# Patient Record
Sex: Male | Born: 1940
Health system: Southern US, Community
[De-identification: ages and names within clinical notes are randomized; demographics above are authoritative.]

## PROBLEM LIST (undated history)

## (undated) DIAGNOSIS — I491 Atrial premature depolarization: Secondary | ICD-10-CM

## (undated) DIAGNOSIS — E785 Hyperlipidemia, unspecified: Secondary | ICD-10-CM

## (undated) DIAGNOSIS — N400 Enlarged prostate without lower urinary tract symptoms: Secondary | ICD-10-CM

## (undated) DIAGNOSIS — L80 Vitiligo: Secondary | ICD-10-CM

## (undated) DIAGNOSIS — M545 Low back pain, unspecified: Secondary | ICD-10-CM

## (undated) DIAGNOSIS — I10 Essential (primary) hypertension: Secondary | ICD-10-CM

## (undated) DIAGNOSIS — Z789 Other specified health status: Secondary | ICD-10-CM

## (undated) DIAGNOSIS — E119 Type 2 diabetes mellitus without complications: Secondary | ICD-10-CM

## (undated) DIAGNOSIS — E538 Deficiency of other specified B group vitamins: Secondary | ICD-10-CM

## (undated) DIAGNOSIS — I341 Nonrheumatic mitral (valve) prolapse: Secondary | ICD-10-CM

## (undated) DIAGNOSIS — M1990 Unspecified osteoarthritis, unspecified site: Secondary | ICD-10-CM

## (undated) HISTORY — DX: Benign prostatic hyperplasia without lower urinary tract symptoms: N40.0

## (undated) HISTORY — DX: Vitiligo: L80

## (undated) HISTORY — DX: Nonrheumatic mitral (valve) prolapse: I34.1

## (undated) HISTORY — DX: Unspecified osteoarthritis, unspecified site: M19.90

## (undated) HISTORY — DX: Deficiency of other specified B group vitamins: E53.8

## (undated) HISTORY — DX: Type 2 diabetes mellitus without complications: E11.9

## (undated) HISTORY — DX: Hyperlipidemia, unspecified: E78.5

## (undated) HISTORY — DX: Low back pain, unspecified: M54.50

## (undated) HISTORY — PX: OTHER SURGICAL HISTORY: SHX169

## (undated) HISTORY — PX: EYE SURGERY: SHX253

## (undated) HISTORY — DX: Low back pain: M54.5

## (undated) HISTORY — DX: Other specified health status: Z78.9

---

## 1969-12-25 HISTORY — PX: HERNIA REPAIR: SHX51

## 1998-08-12 ENCOUNTER — Ambulatory Visit (HOSPITAL_COMMUNITY): Admission: RE | Admit: 1998-08-12 | Discharge: 1998-08-12 | Payer: Self-pay | Admitting: Adolescent Medicine

## 2000-08-06 ENCOUNTER — Ambulatory Visit (HOSPITAL_COMMUNITY): Admission: RE | Admit: 2000-08-06 | Discharge: 2000-08-06 | Payer: Self-pay | Admitting: Emergency Medicine

## 2000-08-06 ENCOUNTER — Encounter: Payer: Self-pay | Admitting: Emergency Medicine

## 2003-09-08 ENCOUNTER — Ambulatory Visit (HOSPITAL_COMMUNITY): Admission: RE | Admit: 2003-09-08 | Discharge: 2003-09-08 | Payer: Self-pay | Admitting: Internal Medicine

## 2003-09-08 ENCOUNTER — Encounter: Payer: Self-pay | Admitting: Internal Medicine

## 2009-12-25 DIAGNOSIS — E538 Deficiency of other specified B group vitamins: Secondary | ICD-10-CM

## 2009-12-25 DIAGNOSIS — E119 Type 2 diabetes mellitus without complications: Secondary | ICD-10-CM

## 2009-12-25 HISTORY — DX: Type 2 diabetes mellitus without complications: E11.9

## 2009-12-25 HISTORY — DX: Deficiency of other specified B group vitamins: E53.8

## 2010-07-11 ENCOUNTER — Ambulatory Visit: Payer: Self-pay | Admitting: Internal Medicine

## 2010-07-11 LAB — CONVERTED CEMR LAB
ALT: 25 units/L (ref 0–53)
AST: 24 units/L (ref 0–37)
Albumin: 4 g/dL (ref 3.5–5.2)
Alkaline Phosphatase: 63 units/L (ref 39–117)
BUN: 19 mg/dL (ref 6–23)
Basophils Absolute: 0 10*3/uL (ref 0.0–0.1)
Basophils Relative: 0.7 % (ref 0.0–3.0)
Bilirubin Urine: NEGATIVE
Bilirubin, Direct: 0.1 mg/dL (ref 0.0–0.3)
CO2: 26 meq/L (ref 19–32)
Calcium: 9.1 mg/dL (ref 8.4–10.5)
Chloride: 109 meq/L (ref 96–112)
Cholesterol: 193 mg/dL (ref 0–200)
Creatinine, Ser: 1.1 mg/dL (ref 0.4–1.5)
Eosinophils Absolute: 0.2 10*3/uL (ref 0.0–0.7)
Eosinophils Relative: 3.2 % (ref 0.0–5.0)
GFR calc non Af Amer: 72.21 mL/min (ref >60)
Glucose, Bld: 135 mg/dL — ABNORMAL HIGH (ref 70–99)
HCT: 44.7 % (ref 39.0–52.0)
HDL: 41.5 mg/dL (ref >39.00)
Hemoglobin, Urine: NEGATIVE
Hemoglobin: 15.4 g/dL (ref 13.0–17.0)
Ketones, ur: NEGATIVE mg/dL
LDL Cholesterol: 135 mg/dL — ABNORMAL HIGH (ref 0–99)
Leukocytes, UA: NEGATIVE
Lymphocytes Relative: 49.9 % — ABNORMAL HIGH (ref 12.0–46.0)
Lymphs Abs: 3.2 10*3/uL (ref 0.7–4.0)
MCHC: 34.3 g/dL (ref 30.0–36.0)
MCV: 92 fL (ref 78.0–100.0)
Monocytes Absolute: 0.6 10*3/uL (ref 0.1–1.0)
Monocytes Relative: 9.1 % (ref 3.0–12.0)
Neutro Abs: 2.4 10*3/uL (ref 1.4–7.7)
Neutrophils Relative %: 37.1 % — ABNORMAL LOW (ref 43.0–77.0)
Nitrite: NEGATIVE
PSA: 0.46 ng/mL (ref 0.10–4.00)
Platelets: 283 10*3/uL (ref 150.0–400.0)
Potassium: 4.4 meq/L (ref 3.5–5.1)
RBC: 4.86 M/uL (ref 4.22–5.81)
RDW: 13.3 % (ref 11.5–14.6)
Sodium: 138 meq/L (ref 135–145)
Specific Gravity, Urine: >=1.03 (ref 1.000–1.030)
Total Bilirubin: 0.6 mg/dL (ref 0.3–1.2)
Total CHOL/HDL Ratio: 5
Total Protein, Urine: NEGATIVE mg/dL
Total Protein: 6.8 g/dL (ref 6.0–8.3)
Triglycerides: 83 mg/dL (ref 0.0–149.0)
Urine Glucose: NEGATIVE mg/dL
Urobilinogen, UA: 0.2 (ref 0.0–1.0)
VLDL: 16.6 mg/dL (ref 0.0–40.0)
WBC: 6.5 10*3/uL (ref 4.5–10.5)
pH: 5 (ref 5.0–8.0)

## 2010-07-12 LAB — CONVERTED CEMR LAB: Vit D, 25-Hydroxy: 23 ng/mL — ABNORMAL LOW (ref 30–89)

## 2010-07-14 ENCOUNTER — Ambulatory Visit: Payer: Self-pay | Admitting: Internal Medicine

## 2010-07-14 DIAGNOSIS — M1990 Unspecified osteoarthritis, unspecified site: Secondary | ICD-10-CM | POA: Insufficient documentation

## 2010-07-14 DIAGNOSIS — N400 Enlarged prostate without lower urinary tract symptoms: Secondary | ICD-10-CM | POA: Insufficient documentation

## 2010-07-14 DIAGNOSIS — M545 Low back pain, unspecified: Secondary | ICD-10-CM | POA: Insufficient documentation

## 2010-07-14 DIAGNOSIS — E785 Hyperlipidemia, unspecified: Secondary | ICD-10-CM | POA: Insufficient documentation

## 2010-07-14 DIAGNOSIS — R7309 Other abnormal glucose: Secondary | ICD-10-CM | POA: Insufficient documentation

## 2010-10-18 ENCOUNTER — Ambulatory Visit: Payer: Self-pay | Admitting: Internal Medicine

## 2010-10-19 LAB — CONVERTED CEMR LAB
BUN: 17 mg/dL (ref 6–23)
CO2: 26 meq/L (ref 19–32)
Calcium: 9.6 mg/dL (ref 8.4–10.5)
Chloride: 105 meq/L (ref 96–112)
Cholesterol: 199 mg/dL (ref 0–200)
Creatinine, Ser: 1.2 mg/dL (ref 0.4–1.5)
GFR calc non Af Amer: 62.1 mL/min (ref >60)
Glucose, Bld: 126 mg/dL — ABNORMAL HIGH (ref 70–99)
HDL: 43 mg/dL (ref >39.00)
Hgb A1c MFr Bld: 7.6 % — ABNORMAL HIGH (ref 4.6–6.5)
LDL Cholesterol: 143 mg/dL — ABNORMAL HIGH (ref 0–99)
Potassium: 4.2 meq/L (ref 3.5–5.1)
Sodium: 139 meq/L (ref 135–145)
Total CHOL/HDL Ratio: 5
Total CK: 188 units/L (ref 7–232)
Triglycerides: 63 mg/dL (ref 0.0–149.0)
VLDL: 12.6 mg/dL (ref 0.0–40.0)
Vitamin B-12: 122 pg/mL — ABNORMAL LOW (ref 211–911)

## 2010-10-20 ENCOUNTER — Ambulatory Visit: Payer: Self-pay | Admitting: Internal Medicine

## 2010-10-20 DIAGNOSIS — E119 Type 2 diabetes mellitus without complications: Secondary | ICD-10-CM | POA: Insufficient documentation

## 2010-10-20 DIAGNOSIS — E538 Deficiency of other specified B group vitamins: Secondary | ICD-10-CM | POA: Insufficient documentation

## 2010-10-20 DIAGNOSIS — E559 Vitamin D deficiency, unspecified: Secondary | ICD-10-CM | POA: Insufficient documentation

## 2010-12-25 HISTORY — PX: CATARACT EXTRACTION: SUR2

## 2011-01-24 NOTE — Assessment & Plan Note (Signed)
Summary: NEW PT CPX/APPT TIME 1:00 / PER DR [name]/NWS   Vital Signs:  Patient profile:   70 year old male Height:      68 inches Weight:      138 pounds BMI:     21.06 O2 Sat:      97 % on Room air Temp:     97.9 degrees F oral Pulse rate:   73 / minute Pulse rhythm:   regular BP sitting:   138 / 80  (left arm) Cuff size:   regular  Vitals Entered By: Lanier Prude, CMA(AAMA) (July 14, 2010 12:58 PM)  O2 Flow:  Room air  History of Present Illness: The patient presents for a wellness examination   Preventive Screening-Counseling & Management  Caffeine-Diet-Exercise     Does Patient Exercise: yes  Current Medications (verified): 1)  Tricor 145 Mg Tabs (Fenofibrate) 2)  Aspirin 325 Mg Tabs (Aspirin) .Marland Kitchen.. 1 Weekly  Allergies (verified): No Known Drug Allergies  Past History:  Past Medical History: Benign prostatic hypertrophy 2009 Hyperlipidemia/TG Low back pain Osteoarthritis MVP  Past Surgical History: Colon Dr Chales Abrahams in [place] 2005 nl ECHO   Family History: GM, F , B HTN F CAD 20 23 uncle sudden death M died 41 OA 2 B HTN Family History of CAD Male 1st degree relative <50  Social History: Occupation: MD Married Regular exercise-yes golf Cigars rare Does Patient Exercise:  yes  Review of Systems  The patient denies anorexia, fever, weight loss, weight gain, vision loss, decreased hearing, hoarseness, chest pain, syncope, dyspnea on exertion, peripheral edema, prolonged cough, headaches, hemoptysis, abdominal pain, melena, hematochezia, severe indigestion/heartburn, hematuria, incontinence, genital sores, muscle weakness, suspicious skin lesions, transient blindness, difficulty walking, depression, unusual weight change, abnormal bleeding, enlarged lymph nodes, angioedema, and testicular masses.    Physical Exam  General:  alert, well-developed, and overweight-appearing.   Head:  normocephalic and atraumatic.   Eyes:  No corneal or conjunctival  inflammation noted. EOMI. Perrla.  Ears:  External ear exam shows no significant lesions or deformities.  Otoscopic examination reveals clear canals, tympanic membranes are intact bilaterally without bulging, retraction, inflammation or discharge. Hearing is grossly normal bilaterally. Nose:  External nasal examination shows no deformity or inflammation. Nasal mucosa are pink and moist without lesions or exudates. Mouth:  Oral mucosa and oropharynx without lesions or exudates.  Teeth in good repair. Neck:  No deformities, masses, or tenderness noted. Chest Wall:  No deformities, masses, tenderness or gynecomastia noted. Lungs:  Normal respiratory effort, chest expands symmetrically. Lungs are clear to auscultation, no crackles or wheezes. Heart:  Normal rate and regular rhythm. S1 and S2 normal without gallop, murmur, click, rub or other extra sounds. Abdomen:  Bowel sounds positive,abdomen soft and non-tender without masses, organomegaly or hernias noted. Msk:  No deformity or scoliosis noted of thoracic or lumbar spine.   Pulses:  R and L carotid,radial,femoral,dorsalis pedis and posterior tibial pulses are full and equal bilaterally Extremities:  No clubbing, cyanosis, edema, or deformity noted with normal full range of motion of all joints.   Neurologic:  No cranial nerve deficits noted. Station and gait are normal. Plantar reflexes are down-going bilaterally. DTRs are symmetrical throughout. Sensory, motor and coordinative functions appear intact. Skin:  Hypopigmented eyelids Cervical Nodes:  No lymphadenopathy noted Inguinal Nodes:  No significant adenopathy Psych:  Cognition and judgment appear intact. Alert and cooperative with normal attention span and concentration. No apparent delusions, illusions, hallucinations   Impression & Recommendations:  Problem #  1:  PHYSICAL EXAMINATION (ICD-V70.0) Assessment New  Health and age related issues were discussed. Available screening tests and  vaccinations were discussed as well. Healthy life style including good diet and execise was discussed.  The labs were reviewed with the patient.   Orders: EMR Misc Charge Code [facility])  Problem # 2:  HYPERGLYCEMIA (ICD-790.29) mild Assessment: New See "Patient Instructions".   Problem # 3:  LOW BACK PAIN (ICD-724.2) Assessment: Comment Only  His updated medication list for this problem includes:    Aspirin 325 Mg Tabs (Aspirin) .Marland Kitchen... 1 by mouth qd  Problem # 4:  BENIGN PROSTATIC HYPERTROPHY (ICD-600.00) Assessment: Unchanged  His updated medication list for this problem includes:    Terazosin Hcl 2 Mg Caps (Terazosin hcl) .Marland Kitchen... 1 by mouth bid  Problem # 5:  HYPERLIPIDEMIA (ICD-272.4) Assessment: Comment Only  His updated medication list for this problem includes:    Tricor 145 Mg Tabs (Fenofibrate) .Marland Kitchen... 1 by mouth once daily - OR    Crestor 10 Mg Tabs (Rosuvastatin calcium) .Marland Kitchen... 1 by mouth once daily for cholesterol if tolerated  Problem # 6:  FAMILY HISTORY OF CAD MALE 1ST DEGREE RELATIVE <50 (ICD-V17.3) Assessment: New Discussed - he will get CL and ECHO and carot US done in [place] this fall. ASA 325 mg a day See "Patient Instructions".   Complete Medication List: 1)  Tricor 145 Mg Tabs (Fenofibrate) .Marland Kitchen.. 1 by mouth qd 2)  Aspirin 325 Mg Tabs (Aspirin) .Marland Kitchen.. 1 by mouth qd 3)  Vitamin D 1000 Unit Tabs (Cholecalciferol) .Marland Kitchen.. 1 by mouth qd 4)  Vitamin D (ergocalciferol) 50000 Unit Caps (Ergocalciferol) .Marland Kitchen.. 1 by mouth q 1 week x 6 weeks then start vit d 1000 international units qd 5)  Crestor 10 Mg Tabs (Rosuvastatin calcium) .Marland Kitchen.. 1 by mouth once daily for cholesterol 6)  Terazosin Hcl 2 Mg Caps (Terazosin hcl) .Marland Kitchen.. 1 by mouth bid  Other Orders: Pneumococcal Vaccine (16109) Admin 1st Vaccine (60454) Zoster (Shingles) Vaccine Live [phone]) Admin of Any Addtl Vaccine (91478)  PSA Screening:    PSA: 0.46  (07/11/2010)  Immunization & Chemoprophylaxis:    Pneumovax:  Pneumovax  (07/14/2010)  Patient Instructions: 1)  Try to eat more raw plant food, fresh and dry fruit, raw almonds, leafy vegetables, whole foods and less red meat, less animal fat. Poultry and fish is better for you than pork and beef. Avoid processed foods (canned soups, hot dogs, sausage, bacon , frozen dinners). Avoid corn syrup, high fructose syrup or aspartam  containing drinks. Honey, Agave and Stevia are better sweeteners. Make your own  dressing with olive oil, wine vinegar, lemon juce, garlic etc. for your salads. 2)  Use stretching and balance exercises that I have provided (15 min. or longer every day)  3)  Start taking a  yoga class  4)  Please schedule a follow-up appointment in 3 months. 5)  BMP prior to visit, ICD-9: 6)  Lipid Panel prior to visit, ICD-9: 7)  HbgA1C prior to visit, ICD-9:272.2 8)  CK 9)  Vit B12 782.0 Prescriptions: TERAZOSIN HCL 2 MG CAPS (TERAZOSIN HCL) 1 by mouth bid  #60 x 12   Entered and Authorized by:   [name] MD   Signed by:   [name] MD on 07/14/2010   Method used:   Print then Give to Patient   RxID:   [id number] TRICOR 145 MG TABS (FENOFIBRATE) 1 by mouth qd  #30 x 12   Entered and Authorized  by:   [name] MD   Signed by:   [name] MD on 07/14/2010   Method used:   Print then Give to Patient   RxID:   [id number] CRESTOR 10 MG TABS (ROSUVASTATIN CALCIUM) 1 by mouth once daily for cholesterol  #30 x 12   Entered and Authorized by:   [name] MD   Signed by:   [name] MD on 07/14/2010   Method used:   Print then Give to Patient   RxID:   [id number] VITAMIN D (ERGOCALCIFEROL) 50000 UNIT CAPS (ERGOCALCIFEROL) 1 by mouth q 1 week x 6 weeks then start Vit D 1000 international units qd  #6 x 0   Entered and Authorized by:   [name] MD   Signed by:   [name] MD on 07/14/2010   Method used:   Print then Give to Patient   RxID:    [id number]      Immunizations Administered:  Pneumonia Vaccine:    Vaccine Type: Pneumovax    Site: left deltoid    Mfr: Merck    Dose: 0.5 ml    Route: IM    Given by: Lanier Prude, CMA(AAMA)    Exp. Date: 08/15/2011    Lot #: [id number]    VIS given: 07/22/96 version given July 14, 2010.  Zostavax # 1:    Vaccine Type: Zostavax    Site: left arm    Mfr: Merck    Dose: 0.57ml    Route: [place]    Given by: Lanier Prude, CMA(AAMA)    Exp. Date: 06/03/2011    Lot #: [id number]    VIS given: 10/06/05 given July 14, 2010.   Orders Added: 1)  Pneumococcal Vaccine [90732] 2)  Admin 1st Vaccine [90471] 3)  Zoster (Shingles) Vaccine Live [90736] 4)  Admin of Any Addtl Vaccine [90472] 5)  EMR Misc Charge Code [EMRMisc]

## 2011-01-24 NOTE — Assessment & Plan Note (Signed)
Summary: 3 mth fu  stc   Vital Signs:  Patient profile:   70 year old male Height:      68 inches Weight:      162 pounds BMI:     24.72 Temp:     98.2 degrees F oral Pulse rate:   68 / minute Pulse rhythm:   regular Resp:     16 per minute BP sitting:   128 / 86  (left arm) Cuff size:   regular  Vitals Entered By: Lanier Prude, CMA(AAMA) (October 20, 2010 12:02 PM) CC: 3 mo f/u Is Patient Diabetic? No   CC:  3 mo f/u.  History of Present Illness: The patient presents for a follow up of abn labs, low Vit D, poss. diabetes, hyperlipidemia   Current Medications (verified): 1)  Tricor 145 Mg Tabs (Fenofibrate) .Marland Kitchen.. 1 By Mouth Qd 2)  Aspirin 325 Mg Tabs (Aspirin) .Marland Kitchen.. 1 By Mouth Qd 3)  Vitamin D 1000 Unit Tabs (Cholecalciferol) .Marland Kitchen.. 1 By Mouth Qd 4)  Vitamin D (Ergocalciferol) 50000 Unit Caps (Ergocalciferol) .Marland Kitchen.. 1 By Mouth Q 1 Week X 6 Weeks Then Start Vit D 1000 International Units Qd 5)  Crestor 10 Mg Tabs (Rosuvastatin Calcium) .Marland Kitchen.. 1 By Mouth Once Daily For Cholesterol 6)  Terazosin Hcl 2 Mg Caps (Terazosin Hcl) .Marland Kitchen.. 1 By Mouth Bid 7)  Cobal-1000 1000 Mcg/ml Soln (Cyanocobalamin) .Marland Kitchen.. 1 Ml Im or Subcutaneously Q 2 Wks (Start With 1 Ml Subcutaneously Once Daily X 7 D, Then 1 Ml Subcutaneously Q 1 Wk X 1 Month, Then 1 Ml Subcutaneously Q 2 Wks) 8)  Bd Insulin Syringe Microfine 27g X 5/8" 1 Ml Misc (Insulin Syringe-Needle U-100) .... As Dirrected 9)  Metformin Hcl 500 Mg Tabs (Metformin Hcl) .Marland Kitchen.. 1 By Mouth Two Times A Day For Diabetes  Allergies (verified): 1)  Crestor 2)  Lipitor  Past History:  Social History: Last updated: 07/14/2010 Occupation: MD Married Regular exercise-yes golf Cigars rare  Past Medical History: Benign prostatic hypertrophy 2009 Hyperlipidemia/TG  -- statin intolerant Low back pain Osteoarthritis MVP Vitiligo VitD def 2011 Vit B12 def 2011 Diabetes mellitus, type II  2011  Review of Systems  The patient denies fever, chest pain,  syncope, and abdominal pain.    Physical Exam  General:  alert, well-developed, and overweight-appearing.   Ears:  External ear exam shows no significant lesions or deformities.  Otoscopic examination reveals clear canals, tympanic membranes are intact bilaterally without bulging, retraction, inflammation or discharge. Hearing is grossly normal bilaterally. Nose:  External nasal examination shows no deformity or inflammation. Nasal mucosa are pink and moist without lesions or exudates. Mouth:  Oral mucosa and oropharynx without lesions or exudates.  Teeth in good repair. Neck:  No deformities, masses, or tenderness noted. Lungs:  Normal respiratory effort, chest expands symmetrically. Lungs are clear to auscultation, no crackles or wheezes. Heart:  Normal rate and regular rhythm. S1 and S2 normal without gallop, murmur, click, rub or other extra sounds. Abdomen:  Bowel sounds positive,abdomen soft and non-tender without masses, organomegaly or hernias noted. Msk:  No deformity or scoliosis noted of thoracic or lumbar spine.   Neurologic:  No cranial nerve deficits noted. Station and gait are normal. Plantar reflexes are down-going bilaterally. DTRs are symmetrical throughout. Sensory, motor and coordinative functions appear intact.   Impression & Recommendations:  Problem # 1:  DIABETES MELLITUS, TYPE II (ICD-250.00) Assessment New  His updated medication list for this problem includes:    Aspirin 325  Mg Tabs (Aspirin) .Marland Kitchen... 1 by mouth qd    Metformin Hcl 500 Mg Tabs (Metformin hcl) .Marland Kitchen... 1 by mouth two times a day for diabetes  Labs Reviewed: Creat: 1.2 (10/18/2010)    Reviewed HgBA1c results: 7.6 (10/18/2010)  Problem # 2:  VITAMIN B12 DEFICIENCY (ICD-266.2) Assessment: New  On the regimen of medicine(s) reflected in the chart    Problem # 3:  HYPERLIPIDEMIA (ICD-272.4) Assessment: Comment Only  His updated medication list for this problem includes:    Tricor 145 Mg Tabs  (Fenofibrate) .Marland Kitchen... 1 by mouth qd    Crestor 10 Mg Tabs (Rosuvastatin calcium) .Marland Kitchen... 1 by mouth once daily for cholesterol  Problem # 4:  BENIGN PROSTATIC HYPERTROPHY (ICD-600.00) Assessment: Unchanged  His updated medication list for this problem includes:    Terazosin Hcl 2 Mg Caps (Terazosin hcl) .Marland Kitchen... 1 by mouth bid  Complete Medication List: 1)  Tricor 145 Mg Tabs (Fenofibrate) .Marland Kitchen.. 1 by mouth qd 2)  Aspirin 325 Mg Tabs (Aspirin) .Marland Kitchen.. 1 by mouth qd 3)  Vitamin D 1000 Unit Tabs (Cholecalciferol) .Marland Kitchen.. 1 by mouth qd 4)  Vitamin D (ergocalciferol) 50000 Unit Caps (Ergocalciferol) .Marland Kitchen.. 1 by mouth q 1 week x 6 weeks then start vit d 1000 international units qd 5)  Crestor 10 Mg Tabs (Rosuvastatin calcium) .Marland Kitchen.. 1 by mouth once daily for cholesterol 6)  Terazosin Hcl 2 Mg Caps (Terazosin hcl) .Marland Kitchen.. 1 by mouth bid 7)  Cobal-1000 1000 Mcg/ml Soln (Cyanocobalamin) .Marland Kitchen.. 1 ml im or subcutaneously q 2 wks (start with 1 ml subcutaneously once daily x 7 d, then 1 ml subcutaneously q 1 wk x 1 month, then 1 ml subcutaneously q 2 wks) 8)  Bd Insulin Syringe Microfine 27g X 5/8" 1 Ml Misc (Insulin syringe-needle u-100) .... As dirrected 9)  Metformin Hcl 500 Mg Tabs (Metformin hcl) .Marland Kitchen.. 1 by mouth two times a day for diabetes  Other Orders: Flu Vaccine 28yrs + MEDICARE PATIENTS (H0865) Administration Flu vaccine - MCR (G0008) Vit B12 1000 mcg (J3420) Admin of Therapeutic Inj  intramuscular or subcutaneous (78469)  Patient Instructions: 1)  Please schedule a follow-up appointment in 6 months. 2)  Labs in 6 wks at [facility]   Medication Administration  Injection # 1:    Medication: Vit B12 1000 mcg    Diagnosis: OTHER VITAMIN B12 DEFICIENCY ANEMIA (ICD-281.1)    Route: IM    Site: R deltoid    Exp Date: 07/25/2012    Lot #: [id number]    Mfr: [employer]ent    Patient tolerated injection without complications    Given by: Lanier Prude, CMA(AAMA) (October 20, 2010 3:00 PM)  Orders Added: 1)  Flu  Vaccine 98yrs + MEDICARE PATIENTS [Q2039] 2)  Administration Flu vaccine - MCR [G0008] 3)  Vit B12 1000 mcg [J3420] 4)  Admin of Therapeutic Inj  intramuscular or subcutaneous [96372] 5)  Est. Patient Level IV [62952]     Flu Vaccine Consent Questions     Do you have a history of severe allergic reactions to this vaccine? no    Any prior history of allergic reactions to egg and/or gelatin? no    Do you have a sensitivity to the preservative Thimersol? no    Do you have a past history of Guillan-Barre Syndrome? no    Do you currently have an acute febrile illness? no    Have you ever had a severe reaction to latex? no    Vaccine information given and explained to  patient? yes    Are you currently pregnant? no    Lot Number:[id number]   Exp Date:06/24/2011   Site Given  Left Deltoid IM   Lanier Prude, [facility])  October 20, 2010 12:05 PM

## 2011-04-20 ENCOUNTER — Encounter: Payer: Self-pay | Admitting: Internal Medicine

## 2011-04-20 ENCOUNTER — Ambulatory Visit (INDEPENDENT_AMBULATORY_CARE_PROVIDER_SITE_OTHER): Payer: Medicare Other | Admitting: Internal Medicine

## 2011-04-20 DIAGNOSIS — E559 Vitamin D deficiency, unspecified: Secondary | ICD-10-CM

## 2011-04-20 DIAGNOSIS — E785 Hyperlipidemia, unspecified: Secondary | ICD-10-CM

## 2011-04-20 DIAGNOSIS — N400 Enlarged prostate without lower urinary tract symptoms: Secondary | ICD-10-CM

## 2011-04-20 DIAGNOSIS — E538 Deficiency of other specified B group vitamins: Secondary | ICD-10-CM

## 2011-04-20 DIAGNOSIS — E119 Type 2 diabetes mellitus without complications: Secondary | ICD-10-CM

## 2011-04-20 MED ORDER — FENOFIBRATE 145 MG PO TABS: 145.0000 mg | ORAL_TABLET | Freq: Every day | ORAL | Status: DC

## 2011-04-20 MED ORDER — TERAZOSIN HCL 2 MG PO CAPS: 2.0000 mg | ORAL_CAPSULE | Freq: Two times a day (BID) | ORAL | Status: DC

## 2011-04-20 MED ORDER — LORATADINE 10 MG PO TABS: 10.0000 mg | ORAL_TABLET | Freq: Every day | ORAL | Status: DC

## 2011-04-20 MED ORDER — CYANOCOBALAMIN 1000 MCG/ML IJ SOLN: 1000.0000 ug | INTRAMUSCULAR | Status: DC

## 2011-04-20 MED ORDER — ASPIRIN 325 MG PO TABS: 325.0000 mg | ORAL_TABLET | Freq: Every day | ORAL | Status: DC

## 2011-04-20 MED ORDER — METFORMIN HCL 500 MG PO TABS: 500.0000 mg | ORAL_TABLET | Freq: Two times a day (BID) | ORAL | Status: DC

## 2011-04-20 NOTE — Assessment & Plan Note (Signed)
Better On Rx 

## 2011-04-20 NOTE — Assessment & Plan Note (Signed)
On Rx 

## 2011-04-20 NOTE — Assessment & Plan Note (Signed)
TG,HDL is good. LDL OK

## 2011-04-20 NOTE — Progress Notes (Signed)
  Subjective:    Patient ID: Louis Kemp, male    DOB: [birth year], 70 y.o.   MRN: [id number]  HPI  F/u vit B 12 and Vit D def, dyslipidemia. C/o allergies.  Review of Systems  Constitutional: Negative for appetite change, fatigue and unexpected weight change.  HENT: Negative for nosebleeds, congestion, sore throat, sneezing, trouble swallowing and neck pain.   Eyes: Positive for discharge, redness and itching. Negative for visual disturbance.  Respiratory: Negative for cough.   Cardiovascular: Negative for chest pain, palpitations and leg swelling.  Gastrointestinal: Negative for nausea, diarrhea, blood in stool and abdominal distention.  Genitourinary: Negative for frequency and hematuria.  Musculoskeletal: Negative for back pain, joint swelling and gait problem.  Skin: Negative for rash.  Neurological: Negative for dizziness, tremors, speech difficulty and weakness.  Psychiatric/Behavioral: Negative for sleep disturbance, dysphoric mood and agitation. The patient is not nervous/anxious.    Wt Readings from Last 3 Encounters:  04/20/11 156 lb (70.761 kg)  10/20/10 162 lb (73.483 kg)  07/14/10 138 lb (62.596 kg)       Objective:   Physical Exam  Constitutional: He is oriented to person, place, and time. He appears well-developed.  HENT:  Mouth/Throat: Oropharynx is clear and moist.  Eyes: Pupils are equal, round, and reactive to light. Right eye exhibits discharge.       B red eyes  Neck: Normal range of motion. No JVD present. No thyromegaly present.  Cardiovascular: Normal rate, regular rhythm, normal heart sounds and intact distal pulses.  Exam reveals no gallop and no friction rub.   No murmur heard. Pulmonary/Chest: Effort normal and breath sounds normal. No respiratory distress. He has no wheezes. He has no rales. He exhibits no tenderness.  Abdominal: Soft. Bowel sounds are normal. He exhibits no distension and no mass. There is no tenderness. There is no rebound and no  guarding.  Musculoskeletal: Normal range of motion. He exhibits no edema and no tenderness.  Lymphadenopathy:    He has no cervical adenopathy.  Neurological: He is alert and oriented to person, place, and time. He has normal reflexes. No cranial nerve deficit. He exhibits normal muscle tone. Coordination normal.  Skin: Skin is warm and dry. Rash (scaly lower eyelids) noted.  Psychiatric: He has a normal mood and affect. His behavior is normal. Judgment and thought content normal.       Labs reviewed   Assessment & Plan:  VITAMIN D DEFICIENCY Better. On Rx  VITAMIN B12 DEFICIENCY Better. Much less tired. On Rx  HYPERLIPIDEMIA TG,HDL is good. LDL OK  DIABETES MELLITUS, TYPE II On Rx  BENIGN PROSTATIC HYPERTROPHY On Rx  Allergic conjunctivitis. Pred.taper. OTC HCT cream to eyelids

## 2011-04-24 ENCOUNTER — Encounter: Payer: Self-pay | Admitting: Internal Medicine

## 2011-10-17 ENCOUNTER — Other Ambulatory Visit: Payer: Self-pay | Admitting: Internal Medicine

## 2011-10-17 LAB — LIPID PANEL
Cholesterol: 201 mg/dL — ABNORMAL HIGH (ref 0–200)
HDL: 55 mg/dL (ref >39)
LDL Cholesterol: 131 mg/dL — ABNORMAL HIGH (ref 0–99)
Total CHOL/HDL Ratio: 3.7 Ratio
Triglycerides: 75 mg/dL (ref <150)
VLDL: 15 mg/dL (ref 0–40)

## 2011-10-17 LAB — PSA: PSA: 0.53 ng/mL (ref <=4.00)

## 2011-10-17 LAB — TSH: TSH: 2.727 u[IU]/mL (ref 0.350–4.500)

## 2011-10-17 LAB — VITAMIN B12: Vitamin B-12: 1848 pg/mL — ABNORMAL HIGH (ref 211–911)

## 2011-10-17 LAB — URINALYSIS, ROUTINE W REFLEX MICROSCOPIC
Bilirubin Urine: NEGATIVE
Glucose, UA: NEGATIVE mg/dL
Hgb urine dipstick: NEGATIVE
Ketones, ur: NEGATIVE mg/dL
Leukocytes, UA: NEGATIVE
Nitrite: NEGATIVE
Protein, ur: NEGATIVE mg/dL
Specific Gravity, Urine: 1.022 (ref 1.005–1.030)
Urobilinogen, UA: 0.2 mg/dL (ref 0.0–1.0)
pH: 5 (ref 5.0–8.0)

## 2011-10-17 LAB — COMPREHENSIVE METABOLIC PANEL
ALT: 21 U/L (ref 0–53)
AST: 26 U/L (ref 0–37)
Albumin: 4.9 g/dL (ref 3.5–5.2)
Alkaline Phosphatase: 57 U/L (ref 39–117)
BUN: 17 mg/dL (ref 6–23)
CO2: 28 mEq/L (ref 19–32)
Calcium: 10.5 mg/dL (ref 8.4–10.5)
Chloride: 103 mEq/L (ref 96–112)
Creat: 1.39 mg/dL — ABNORMAL HIGH (ref 0.50–1.35)
Glucose, Bld: 118 mg/dL — ABNORMAL HIGH (ref 70–99)
Potassium: 4.4 mEq/L (ref 3.5–5.3)
Sodium: 142 mEq/L (ref 135–145)
Total Bilirubin: 0.7 mg/dL (ref 0.3–1.2)
Total Protein: 8.1 g/dL (ref 6.0–8.3)

## 2011-10-18 LAB — VITAMIN D 25 HYDROXY (VIT D DEFICIENCY, FRACTURES): Vit D, 25-Hydroxy: 35 ng/mL (ref 30–89)

## 2011-10-18 LAB — CBC
HCT: 47.7 % (ref 39.0–52.0)
Hemoglobin: 15.9 g/dL (ref 13.0–17.0)
MCH: 30.2 pg (ref 26.0–34.0)
MCHC: 33.3 g/dL (ref 30.0–36.0)
MCV: 90.5 fL (ref 78.0–100.0)
Platelets: 324 10*3/uL (ref 150–400)
RBC: 5.27 MIL/uL (ref 4.22–5.81)
RDW: 13.5 % (ref 11.5–15.5)
WBC: 7.2 10*3/uL (ref 4.0–10.5)

## 2011-10-18 LAB — HEMOGLOBIN A1C
Hgb A1c MFr Bld: 6 % — ABNORMAL HIGH (ref <5.7)
Mean Plasma Glucose: 126 mg/dL — ABNORMAL HIGH (ref <117)

## 2011-10-19 ENCOUNTER — Ambulatory Visit: Payer: Medicare Other | Admitting: Internal Medicine

## 2011-10-20 ENCOUNTER — Encounter: Payer: Self-pay | Admitting: Internal Medicine

## 2011-10-20 ENCOUNTER — Ambulatory Visit (INDEPENDENT_AMBULATORY_CARE_PROVIDER_SITE_OTHER): Payer: Medicare Other | Admitting: Internal Medicine

## 2011-10-20 VITALS — BP 140/70 | HR 80 | Temp 96.9°F | Resp 16 | Wt 152.0 lb

## 2011-10-20 DIAGNOSIS — E119 Type 2 diabetes mellitus without complications: Secondary | ICD-10-CM

## 2011-10-20 DIAGNOSIS — E785 Hyperlipidemia, unspecified: Secondary | ICD-10-CM

## 2011-10-20 DIAGNOSIS — Z23 Encounter for immunization: Secondary | ICD-10-CM

## 2011-10-20 DIAGNOSIS — E538 Deficiency of other specified B group vitamins: Secondary | ICD-10-CM

## 2011-10-20 DIAGNOSIS — E559 Vitamin D deficiency, unspecified: Secondary | ICD-10-CM

## 2011-10-20 MED ORDER — TERAZOSIN HCL 2 MG PO CAPS: 2.0000 mg | ORAL_CAPSULE | Freq: Two times a day (BID) | ORAL | Status: DC

## 2011-10-20 MED ORDER — FENOFIBRATE 145 MG PO TABS: 145.0000 mg | ORAL_TABLET | Freq: Every day | ORAL | Status: DC

## 2011-10-20 MED ORDER — METFORMIN HCL 500 MG PO TABS: 500.0000 mg | ORAL_TABLET | Freq: Two times a day (BID) | ORAL | Status: DC

## 2011-11-04 ENCOUNTER — Encounter: Payer: Self-pay | Admitting: Internal Medicine

## 2011-11-04 NOTE — Assessment & Plan Note (Signed)
Continue with current prescription therapy as reflected on the Med list.  

## 2011-11-04 NOTE — Assessment & Plan Note (Signed)
Better Continue with current prescription therapy as reflected on the Med list.  

## 2011-11-04 NOTE — Progress Notes (Signed)
  Subjective:    Patient ID: Louis Kemp, male    DOB: 1941/07/30, 70 y.o.   MRN: [id number]  HPI  The patient presents for a follow-up of  Chronic vit D and B12 def, chronic dyslipidemia, type 2 diabetes controlled with medicines   Review of Systems  Constitutional: Negative for appetite change, fatigue and unexpected weight change.  HENT: Negative for nosebleeds, congestion, sore throat, sneezing, trouble swallowing and neck pain.   Eyes: Negative for itching and visual disturbance.  Respiratory: Negative for cough.   Cardiovascular: Negative for chest pain, palpitations and leg swelling.  Gastrointestinal: Negative for nausea, diarrhea, blood in stool and abdominal distention.  Genitourinary: Negative for frequency and hematuria.  Musculoskeletal: Negative for back pain, joint swelling and gait problem.  Skin: Negative for rash.  Neurological: Negative for dizziness, tremors, speech difficulty and weakness.  Psychiatric/Behavioral: Negative for sleep disturbance, dysphoric mood and agitation. The patient is not nervous/anxious.        Objective:   Physical Exam  Constitutional: He is oriented to person, place, and time. He appears well-developed.  HENT:  Mouth/Throat: Oropharynx is clear and moist.  Eyes: Conjunctivae are normal. Pupils are equal, round, and reactive to light.  Neck: Normal range of motion. No JVD present. No thyromegaly present.  Cardiovascular: Normal rate, regular rhythm, normal heart sounds and intact distal pulses.  Exam reveals no gallop and no friction rub.   No murmur heard. Pulmonary/Chest: Effort normal and breath sounds normal. No respiratory distress. He has no wheezes. He has no rales. He exhibits no tenderness.  Abdominal: Soft. Bowel sounds are normal. He exhibits no distension and no mass. There is no tenderness. There is no rebound and no guarding.  Musculoskeletal: Normal range of motion. He exhibits no edema and no tenderness.  Lymphadenopathy:     He has no cervical adenopathy.  Neurological: He is alert and oriented to person, place, and time. He has normal reflexes. No cranial nerve deficit. He exhibits normal muscle tone. Coordination normal.  Skin: Skin is warm and dry. No rash noted.  Psychiatric: He has a normal mood and affect. His behavior is normal. Judgment and thought content normal.    Lab Results  Component Value Date   WBC 6.5 07/11/2010   HGB 15.4 07/11/2010   HCT 44.7 07/11/2010   PLT 283.0 07/11/2010   GLUCOSE 126* 10/18/2010   CHOL 199 10/18/2010   TRIG 63.0 10/18/2010   HDL 43.00 10/18/2010   LDLCALC 143* 10/18/2010   ALT 25 07/11/2010   AST 24 07/11/2010   NA 139 10/18/2010   K 4.2 10/18/2010   CL 105 10/18/2010   CREATININE 1.2 10/18/2010   BUN 17 10/18/2010   CO2 26 10/18/2010   PSA 0.46 07/11/2010   HGBA1C 7.6* 10/18/2010         Assessment & Plan:

## 2012-04-09 ENCOUNTER — Other Ambulatory Visit: Payer: Self-pay | Admitting: Internal Medicine

## 2012-04-10 LAB — URINALYSIS, ROUTINE W REFLEX MICROSCOPIC
Bilirubin Urine: NEGATIVE
Glucose, UA: NEGATIVE mg/dL
Hgb urine dipstick: NEGATIVE
Ketones, ur: NEGATIVE mg/dL
Leukocytes, UA: NEGATIVE
Nitrite: NEGATIVE
Protein, ur: NEGATIVE mg/dL
Specific Gravity, Urine: 1.018 (ref 1.005–1.030)
Urobilinogen, UA: 0.2 mg/dL (ref 0.0–1.0)
pH: 5 (ref 5.0–8.0)

## 2012-04-10 LAB — COMPREHENSIVE METABOLIC PANEL
ALT: 19 U/L (ref 0–53)
AST: 18 U/L (ref 0–37)
Albumin: 4.4 g/dL (ref 3.5–5.2)
Alkaline Phosphatase: 61 U/L (ref 39–117)
BUN: 12 mg/dL (ref 6–23)
CO2: 27 mEq/L (ref 19–32)
Calcium: 9.6 mg/dL (ref 8.4–10.5)
Chloride: 104 mEq/L (ref 96–112)
Creat: 1.06 mg/dL (ref 0.50–1.35)
Glucose, Bld: 111 mg/dL — ABNORMAL HIGH (ref 70–99)
Potassium: 4.3 mEq/L (ref 3.5–5.3)
Sodium: 140 mEq/L (ref 135–145)
Total Bilirubin: 0.5 mg/dL (ref 0.3–1.2)
Total Protein: 6.9 g/dL (ref 6.0–8.3)

## 2012-04-10 LAB — TSH: TSH: 2.73 u[IU]/mL (ref 0.350–4.500)

## 2012-04-10 LAB — LIPID PANEL
Cholesterol: 203 mg/dL — ABNORMAL HIGH (ref 0–200)
HDL: 51 mg/dL (ref >39)
LDL Cholesterol: 137 mg/dL — ABNORMAL HIGH (ref 0–99)
Total CHOL/HDL Ratio: 4 Ratio
Triglycerides: 75 mg/dL (ref <150)
VLDL: 15 mg/dL (ref 0–40)

## 2012-04-10 LAB — VITAMIN B12: Vitamin B-12: 971 pg/mL — ABNORMAL HIGH (ref 211–911)

## 2012-04-10 LAB — CBC
HCT: 46.2 % (ref 39.0–52.0)
Hemoglobin: 15.2 g/dL (ref 13.0–17.0)
MCH: 30.2 pg (ref 26.0–34.0)
MCHC: 32.9 g/dL (ref 30.0–36.0)
MCV: 91.8 fL (ref 78.0–100.0)
Platelets: 353 10*3/uL (ref 150–400)
RBC: 5.03 MIL/uL (ref 4.22–5.81)
RDW: 13.7 % (ref 11.5–15.5)
WBC: 6.5 10*3/uL (ref 4.0–10.5)

## 2012-04-10 LAB — PSA: PSA: 0.5 ng/mL (ref <=4.00)

## 2012-04-10 LAB — HEMOGLOBIN A1C
Hgb A1c MFr Bld: 6.2 % — ABNORMAL HIGH (ref <5.7)
Mean Plasma Glucose: 131 mg/dL — ABNORMAL HIGH (ref <117)

## 2012-04-18 ENCOUNTER — Ambulatory Visit (INDEPENDENT_AMBULATORY_CARE_PROVIDER_SITE_OTHER): Payer: Medicare Other | Admitting: Internal Medicine

## 2012-04-18 ENCOUNTER — Encounter: Payer: Self-pay | Admitting: Internal Medicine

## 2012-04-18 VITALS — BP 160/100 | HR 76 | Temp 97.9°F | Resp 16 | Wt 155.0 lb

## 2012-04-18 DIAGNOSIS — M545 Low back pain, unspecified: Secondary | ICD-10-CM

## 2012-04-18 DIAGNOSIS — E538 Deficiency of other specified B group vitamins: Secondary | ICD-10-CM

## 2012-04-18 DIAGNOSIS — E119 Type 2 diabetes mellitus without complications: Secondary | ICD-10-CM

## 2012-04-18 DIAGNOSIS — Z0000 Encounter for general adult medical examination without abnormal findings: Secondary | ICD-10-CM

## 2012-04-18 DIAGNOSIS — N400 Enlarged prostate without lower urinary tract symptoms: Secondary | ICD-10-CM

## 2012-04-18 DIAGNOSIS — E785 Hyperlipidemia, unspecified: Secondary | ICD-10-CM

## 2012-04-18 DIAGNOSIS — I10 Essential (primary) hypertension: Secondary | ICD-10-CM | POA: Insufficient documentation

## 2012-04-18 DIAGNOSIS — E559 Vitamin D deficiency, unspecified: Secondary | ICD-10-CM

## 2012-04-18 MED ORDER — FENOFIBRATE 145 MG PO TABS: 145.0000 mg | ORAL_TABLET | Freq: Every day | ORAL | Status: DC

## 2012-04-18 MED ORDER — LORATADINE 10 MG PO TABS: 10.0000 mg | ORAL_TABLET | Freq: Every day | ORAL | Status: DC

## 2012-04-18 MED ORDER — TERAZOSIN HCL 2 MG PO CAPS: 2.0000 mg | ORAL_CAPSULE | Freq: Two times a day (BID) | ORAL | Status: DC

## 2012-04-18 MED ORDER — LOSARTAN POTASSIUM 100 MG PO TABS: 100.0000 mg | ORAL_TABLET | Freq: Every day | ORAL | Status: DC

## 2012-04-18 MED ORDER — CYANOCOBALAMIN 1000 MCG/ML IJ SOLN: 1000.0000 ug | INTRAMUSCULAR | Status: DC

## 2012-04-18 MED ORDER — METFORMIN HCL 500 MG PO TABS: 500.0000 mg | ORAL_TABLET | Freq: Two times a day (BID) | ORAL | Status: DC

## 2012-04-18 NOTE — Assessment & Plan Note (Signed)
Start Losartan 

## 2012-04-18 NOTE — Patient Instructions (Signed)
Normal BP<130/85 

## 2012-04-18 NOTE — Assessment & Plan Note (Signed)
Continue with current prescription therapy as reflected on the Med list.  

## 2012-04-18 NOTE — Assessment & Plan Note (Signed)
Doing well Continue with current prescription therapy as reflected on the Med list.  

## 2012-04-18 NOTE — Assessment & Plan Note (Signed)
Resolved

## 2012-04-18 NOTE — Progress Notes (Signed)
Patient ID: Louis Kemp, male   DOB: 06/05/1941, 71 y.o.   MRN: [id number]  Subjective:    Patient ID: Louis Kemp, male    DOB: 1941/05/04, 71 y.o.   MRN: [id number]  HPI  The patient presents for a follow-up of  Chronic vit D and B12 def, chronic dyslipidemia, type 2 diabetes controlled with medicines. He retired in 4/13.  BP Readings from Last 3 Encounters:  04/18/12 160/100  10/20/11 140/70  04/20/11 160/80   Wt Readings from Last 3 Encounters:  04/18/12 155 lb (70.308 kg)  10/20/11 152 lb (68.947 kg)  04/20/11 156 lb (70.761 kg)      Review of Systems  Constitutional: Negative for appetite change, fatigue and unexpected weight change.  HENT: Negative for nosebleeds, congestion, sore throat, sneezing, trouble swallowing and neck pain.   Eyes: Negative for itching and visual disturbance.  Respiratory: Negative for cough.   Cardiovascular: Negative for chest pain, palpitations and leg swelling.  Gastrointestinal: Negative for nausea, diarrhea, blood in stool and abdominal distention.  Genitourinary: Negative for frequency and hematuria.  Musculoskeletal: Negative for back pain, joint swelling and gait problem.  Skin: Negative for rash.  Neurological: Negative for dizziness, tremors, speech difficulty and weakness.  Psychiatric/Behavioral: Negative for sleep disturbance, dysphoric mood and agitation. The patient is not nervous/anxious.        Objective:   Physical Exam  Constitutional: He is oriented to person, place, and time. He appears well-developed.  HENT:  Mouth/Throat: Oropharynx is clear and moist.  Eyes: Conjunctivae are normal. Pupils are equal, round, and reactive to light.  Neck: Normal range of motion. No JVD present. No thyromegaly present.  Cardiovascular: Normal rate, regular rhythm, normal heart sounds and intact distal pulses.  Exam reveals no gallop and no friction rub.   No murmur heard. Pulmonary/Chest: Effort normal and breath sounds normal. No  respiratory distress. He has no wheezes. He has no rales. He exhibits no tenderness.  Abdominal: Soft. Bowel sounds are normal. He exhibits no distension and no mass. There is no tenderness. There is no rebound and no guarding.  Musculoskeletal: Normal range of motion. He exhibits no edema and no tenderness.  Lymphadenopathy:    He has no cervical adenopathy.  Neurological: He is alert and oriented to person, place, and time. He has normal reflexes. No cranial nerve deficit. He exhibits normal muscle tone. Coordination normal.  Skin: Skin is warm and dry. No rash noted.  Psychiatric: He has a normal mood and affect. His behavior is normal. Judgment and thought content normal.    Lab Results  Component Value Date   WBC 6.5 04/09/2012   HGB 15.2 04/09/2012   HCT 46.2 04/09/2012   PLT 353 04/09/2012   GLUCOSE 111* 04/09/2012   CHOL 203* 04/09/2012   TRIG 75 04/09/2012   HDL 51 04/09/2012   LDLCALC 137* 04/09/2012   ALT 19 04/09/2012   AST 18 04/09/2012   NA 140 04/09/2012   K 4.3 04/09/2012   CL 104 04/09/2012   CREATININE 1.06 04/09/2012   BUN 12 04/09/2012   CO2 27 04/09/2012   TSH 2.730 04/09/2012   PSA 0.50 04/09/2012   HGBA1C 6.2* 04/09/2012         Assessment & Plan:

## 2012-05-06 ENCOUNTER — Telehealth: Payer: Self-pay | Admitting: *Deleted

## 2012-05-06 DIAGNOSIS — Z125 Encounter for screening for malignant neoplasm of prostate: Secondary | ICD-10-CM

## 2012-05-06 DIAGNOSIS — Z0000 Encounter for general adult medical examination without abnormal findings: Secondary | ICD-10-CM

## 2012-05-06 NOTE — Telephone Encounter (Signed)
Labs are in system

## 2012-05-06 NOTE — Telephone Encounter (Signed)
Message copied by Merrilyn Puma on Mon May 06, 2012 11:42 AM ------      Message from: Etheleen Sia      Created: Thu Apr 18, 2012  8:16 AM      Regarding: PHYSICAL LABS       PLEASE SEE IF LABS ARE IN/ PHYSICAL OCT 29  [facility] MEDICARE

## 2012-10-16 ENCOUNTER — Other Ambulatory Visit: Payer: Self-pay | Admitting: Internal Medicine

## 2012-10-16 LAB — CBC
HCT: 42.4 % (ref 39.0–52.0)
Hemoglobin: 15.1 g/dL (ref 13.0–17.0)
MCH: 30.4 pg (ref 26.0–34.0)
MCHC: 35.6 g/dL (ref 30.0–36.0)
MCV: 85.5 fL (ref 78.0–100.0)
Platelets: 382 10*3/uL (ref 150–400)
RBC: 4.96 MIL/uL (ref 4.22–5.81)
RDW: 12.8 % (ref 11.5–15.5)
WBC: 6.8 10*3/uL (ref 4.0–10.5)

## 2012-10-16 LAB — BASIC METABOLIC PANEL
BUN: 15 mg/dL (ref 6–23)
CO2: 29 mEq/L (ref 19–32)
Calcium: 10 mg/dL (ref 8.4–10.5)
Chloride: 103 mEq/L (ref 96–112)
Creat: 1.38 mg/dL — ABNORMAL HIGH (ref 0.50–1.35)
Glucose, Bld: 93 mg/dL (ref 70–99)
Potassium: 4.6 mEq/L (ref 3.5–5.3)
Sodium: 139 mEq/L (ref 135–145)

## 2012-10-16 LAB — HEPATIC FUNCTION PANEL
ALT: 21 U/L (ref 0–53)
AST: 25 U/L (ref 0–37)
Albumin: 4.4 g/dL (ref 3.5–5.2)
Alkaline Phosphatase: 52 U/L (ref 39–117)
Bilirubin, Direct: 0.1 mg/dL (ref 0.0–0.3)
Indirect Bilirubin: 0.5 mg/dL (ref 0.0–0.9)
Total Bilirubin: 0.6 mg/dL (ref 0.3–1.2)
Total Protein: 7 g/dL (ref 6.0–8.3)

## 2012-10-16 LAB — TSH: TSH: 2.34 u[IU]/mL (ref 0.350–4.500)

## 2012-10-16 LAB — VITAMIN B12: Vitamin B-12: 736 pg/mL (ref 211–911)

## 2012-10-16 LAB — LIPID PANEL
Cholesterol: 192 mg/dL (ref 0–200)
HDL: 53 mg/dL (ref >39)
LDL Cholesterol: 126 mg/dL — ABNORMAL HIGH (ref 0–99)
Total CHOL/HDL Ratio: 3.6 Ratio
Triglycerides: 65 mg/dL (ref <150)
VLDL: 13 mg/dL (ref 0–40)

## 2012-10-17 LAB — URINALYSIS, ROUTINE W REFLEX MICROSCOPIC
Bilirubin Urine: NEGATIVE
Glucose, UA: NEGATIVE mg/dL
Hgb urine dipstick: NEGATIVE
Ketones, ur: NEGATIVE mg/dL
Leukocytes, UA: NEGATIVE
Nitrite: NEGATIVE
Protein, ur: NEGATIVE mg/dL
Specific Gravity, Urine: 1.018 (ref 1.005–1.030)
Urobilinogen, UA: 0.2 mg/dL (ref 0.0–1.0)
pH: 5 (ref 5.0–8.0)

## 2012-10-17 LAB — VITAMIN D 25 HYDROXY (VIT D DEFICIENCY, FRACTURES): Vit D, 25-Hydroxy: 30 ng/mL (ref 30–89)

## 2012-10-22 ENCOUNTER — Encounter: Payer: Self-pay | Admitting: Internal Medicine

## 2012-10-22 ENCOUNTER — Ambulatory Visit (INDEPENDENT_AMBULATORY_CARE_PROVIDER_SITE_OTHER): Payer: Medicare Other | Admitting: Internal Medicine

## 2012-10-22 VITALS — BP 158/80 | HR 80 | Temp 97.9°F | Resp 16 | Wt 152.0 lb

## 2012-10-22 DIAGNOSIS — Z0000 Encounter for general adult medical examination without abnormal findings: Secondary | ICD-10-CM

## 2012-10-22 DIAGNOSIS — E538 Deficiency of other specified B group vitamins: Secondary | ICD-10-CM

## 2012-10-22 DIAGNOSIS — Z136 Encounter for screening for cardiovascular disorders: Secondary | ICD-10-CM

## 2012-10-22 DIAGNOSIS — I10 Essential (primary) hypertension: Secondary | ICD-10-CM

## 2012-10-22 DIAGNOSIS — E119 Type 2 diabetes mellitus without complications: Secondary | ICD-10-CM

## 2012-10-22 DIAGNOSIS — R209 Unspecified disturbances of skin sensation: Secondary | ICD-10-CM

## 2012-10-22 DIAGNOSIS — R202 Paresthesia of skin: Secondary | ICD-10-CM | POA: Insufficient documentation

## 2012-10-22 DIAGNOSIS — E559 Vitamin D deficiency, unspecified: Secondary | ICD-10-CM

## 2012-10-22 MED ORDER — L-METHYLFOLATE-B6-B12 3-35-2 MG PO TABS: 1.0000 | ORAL_TABLET | Freq: Two times a day (BID) | ORAL | Status: DC

## 2012-10-22 MED ORDER — CHOLECALCIFEROL 25 MCG (1000 UT) PO TABS: 2000.0000 [IU] | ORAL_TABLET | Freq: Every day | ORAL | Status: DC

## 2012-10-22 NOTE — Progress Notes (Signed)
Subjective:    Patient ID: Louis Kemp, male    DOB: [birth year], 71 y.o.   MRN: [id number]  HPI The patient is here for a wellness exam. The patient has been doing well overall without major physical or psychological issues going on lately. The patient needs to address  chronic hypertension that has been well controlled with medicines; to address chronic  hyperlipidemia controlled with medicines as well; and to address type 2 chronic diabetes, controlled with medical treatment and diet.  The patient presents for a follow-up of  Chronic vit D and B12 def, chronic dyslipidemia, type 2 diabetes controlled with medicines. He retired in 4/13.  BP Readings from Last 3 Encounters:  10/22/12 158/80  04/18/12 160/100  10/20/11 140/70   Wt Readings from Last 3 Encounters:  10/22/12 152 lb (68.947 kg)  04/18/12 155 lb (70.308 kg)  10/20/11 152 lb (68.947 kg)      Review of Systems  Constitutional: Negative for appetite change, fatigue and unexpected weight change.  HENT: Negative for nosebleeds, congestion, sore throat, sneezing, trouble swallowing and neck pain.   Eyes: Negative for itching and visual disturbance.  Respiratory: Negative for cough.   Cardiovascular: Negative for chest pain, palpitations and leg swelling.  Gastrointestinal: Negative for nausea, diarrhea, blood in stool and abdominal distention.  Genitourinary: Negative for frequency and hematuria.  Musculoskeletal: Negative for back pain, joint swelling and gait problem.  Skin: Negative for rash.  Neurological: Negative for dizziness, tremors, speech difficulty and weakness.  Psychiatric/Behavioral: Negative for disturbed wake/sleep cycle, dysphoric mood and agitation. The patient is not nervous/anxious.        Objective:   Physical Exam  Constitutional: He is oriented to person, place, and time. He appears well-developed.  HENT:  Mouth/Throat: Oropharynx is clear and moist.  Eyes: Conjunctivae normal are normal.  Pupils are equal, round, and reactive to light.  Neck: Normal range of motion. No JVD present. No thyromegaly present.  Cardiovascular: Normal rate, regular rhythm, normal heart sounds and intact distal pulses.  Exam reveals no gallop and no friction rub.   No murmur heard. Pulmonary/Chest: Effort normal and breath sounds normal. No respiratory distress. He has no wheezes. He has no rales. He exhibits no tenderness.  Abdominal: Soft. Bowel sounds are normal. He exhibits no distension and no mass. There is no tenderness. There is no rebound and no guarding.  Musculoskeletal: Normal range of motion. He exhibits no edema and no tenderness.  Lymphadenopathy:    He has no cervical adenopathy.  Neurological: He is alert and oriented to person, place, and time. He has normal reflexes. No cranial nerve deficit. He exhibits normal muscle tone. Coordination normal.  Skin: Skin is warm and dry. No rash noted.  Psychiatric: He has a normal mood and affect. His behavior is normal. Judgment and thought content normal.    Lab Results  Component Value Date   WBC 6.8 10/16/2012   HGB 15.1 10/16/2012   HCT 42.4 10/16/2012   PLT 382 10/16/2012   GLUCOSE 93 10/16/2012   CHOL 192 10/16/2012   TRIG 65 10/16/2012   HDL 53 10/16/2012   LDLCALC 126* 10/16/2012   ALT 21 10/16/2012   AST 25 10/16/2012   NA 139 10/16/2012   K 4.6 10/16/2012   CL 103 10/16/2012   CREATININE 1.38* 10/16/2012   BUN 15 10/16/2012   CO2 29 10/16/2012   TSH 2.340 10/16/2012   PSA 0.50 04/09/2012   HGBA1C 6.2* 04/09/2012  Assessment & Plan:

## 2012-11-03 DIAGNOSIS — Z0000 Encounter for general adult medical examination without abnormal findings: Secondary | ICD-10-CM | POA: Insufficient documentation

## 2012-11-03 NOTE — Assessment & Plan Note (Signed)
Continue with current prescription therapy as reflected on the Med list.  

## 2012-11-03 NOTE — Assessment & Plan Note (Signed)
10/13 - new - mostly fingertips -- ?etiology Discussed w/up Neurol ref was offered

## 2012-11-03 NOTE — Assessment & Plan Note (Signed)

## 2012-11-12 ENCOUNTER — Other Ambulatory Visit: Payer: Self-pay | Admitting: *Deleted

## 2012-11-12 MED ORDER — METFORMIN HCL 500 MG PO TABS: 500.0000 mg | ORAL_TABLET | Freq: Two times a day (BID) | ORAL | Status: DC

## 2013-01-13 ENCOUNTER — Encounter: Payer: Self-pay | Admitting: Internal Medicine

## 2013-01-13 ENCOUNTER — Other Ambulatory Visit: Payer: Self-pay | Admitting: Internal Medicine

## 2013-01-13 ENCOUNTER — Ambulatory Visit (INDEPENDENT_AMBULATORY_CARE_PROVIDER_SITE_OTHER): Payer: Medicare Other | Admitting: Internal Medicine

## 2013-01-13 ENCOUNTER — Ambulatory Visit: Payer: Medicare Other | Admitting: Internal Medicine

## 2013-01-13 VITALS — BP 140/82 | HR 68 | Temp 97.5°F | Resp 16 | Wt 154.0 lb

## 2013-01-13 DIAGNOSIS — E119 Type 2 diabetes mellitus without complications: Secondary | ICD-10-CM

## 2013-01-13 DIAGNOSIS — R011 Cardiac murmur, unspecified: Secondary | ICD-10-CM

## 2013-01-13 DIAGNOSIS — R002 Palpitations: Secondary | ICD-10-CM

## 2013-01-13 DIAGNOSIS — I491 Atrial premature depolarization: Secondary | ICD-10-CM | POA: Insufficient documentation

## 2013-01-13 DIAGNOSIS — E538 Deficiency of other specified B group vitamins: Secondary | ICD-10-CM

## 2013-01-13 DIAGNOSIS — E559 Vitamin D deficiency, unspecified: Secondary | ICD-10-CM

## 2013-01-13 LAB — TSH: TSH: 1.235 u[IU]/mL (ref 0.350–4.500)

## 2013-01-13 LAB — MAGNESIUM: Magnesium: 2 mg/dL (ref 1.5–2.5)

## 2013-01-13 LAB — BASIC METABOLIC PANEL
BUN: 20 mg/dL (ref 6–23)
CO2: 28 mEq/L (ref 19–32)
Calcium: 10.6 mg/dL — ABNORMAL HIGH (ref 8.4–10.5)
Chloride: 103 mEq/L (ref 96–112)
Creat: 1.22 mg/dL (ref 0.50–1.35)
Glucose, Bld: 100 mg/dL — ABNORMAL HIGH (ref 70–99)
Potassium: 4.5 mEq/L (ref 3.5–5.3)
Sodium: 139 mEq/L (ref 135–145)

## 2013-01-13 NOTE — Assessment & Plan Note (Signed)
Continue with current prescription therapy as reflected on the Med list.  

## 2013-01-13 NOTE — Progress Notes (Signed)
Subjective:    Palpitations  This is a new problem. The current episode started in the past 7 days. The problem occurs daily. Pertinent negatives include no chest pain, coughing, dizziness, nausea or weakness. Associated symptoms comments: None He checked his pulse and found irregular beats.    The patient needs to address  chronic hypertension that has been well controlled with medicines; to address chronic  hyperlipidemia controlled with medicines as well; and to address type 2 chronic diabetes, controlled with medical treatment and diet.  The patient presents for a follow-up of  Chronic vit D and B12 def, chronic dyslipidemia, type 2 diabetes controlled with medicines. He retired in 4/13.  BP Readings from Last 3 Encounters:  01/13/13 140/82  10/22/12 158/80  04/18/12 160/100   Wt Readings from Last 3 Encounters:  01/13/13 154 lb (69.854 kg)  10/22/12 152 lb (68.947 kg)  04/18/12 155 lb (70.308 kg)      Review of Systems  Constitutional: Negative for appetite change, fatigue and unexpected weight change.  HENT: Negative for nosebleeds, congestion, sore throat, sneezing, trouble swallowing and neck pain.   Eyes: Negative for itching and visual disturbance.  Respiratory: Negative for cough.   Cardiovascular: Positive for palpitations. Negative for chest pain and leg swelling.  Gastrointestinal: Negative for nausea, diarrhea, blood in stool and abdominal distention.  Genitourinary: Negative for frequency and hematuria.  Musculoskeletal: Negative for back pain, joint swelling and gait problem.  Skin: Negative for rash.  Neurological: Negative for dizziness, tremors, speech difficulty and weakness.  Psychiatric/Behavioral: Negative for sleep disturbance, dysphoric mood and agitation. The patient is not nervous/anxious.        Objective:   Physical Exam  Constitutional: He is oriented to person, place, and time. He appears well-developed.  HENT:  Mouth/Throat: Oropharynx  is clear and moist.  Eyes: Conjunctivae normal are normal. Pupils are equal, round, and reactive to light.  Neck: Normal range of motion. No JVD present. No thyromegaly present.  Cardiovascular: Normal rate, regular rhythm, normal heart sounds and intact distal pulses.  Exam reveals no gallop and no friction rub.   No murmur heard. Pulmonary/Chest: Effort normal and breath sounds normal. No respiratory distress. He has no wheezes. He has no rales. He exhibits no tenderness.  Abdominal: Soft. Bowel sounds are normal. He exhibits no distension and no mass. There is no tenderness. There is no rebound and no guarding.  Musculoskeletal: Normal range of motion. He exhibits no edema and no tenderness.  Lymphadenopathy:    He has no cervical adenopathy.  Neurological: He is alert and oriented to person, place, and time. He has normal reflexes. No cranial nerve deficit. He exhibits normal muscle tone. Coordination normal.  Skin: Skin is warm and dry. No rash noted.  Psychiatric: He has a normal mood and affect. His behavior is normal. Judgment and thought content normal.    Lab Results  Component Value Date   WBC 6.8 10/16/2012   HGB 15.1 10/16/2012   HCT 42.4 10/16/2012   PLT 382 10/16/2012   GLUCOSE 93 10/16/2012   CHOL 192 10/16/2012   TRIG 65 10/16/2012   HDL 53 10/16/2012   LDLCALC 126* 10/16/2012   ALT 21 10/16/2012   AST 25 10/16/2012   NA 139 10/16/2012   K 4.6 10/16/2012   CL 103 10/16/2012   CREATININE 1.38* 10/16/2012   BUN 15 10/16/2012   CO2 29 10/16/2012   TSH 2.340 10/16/2012   PSA 0.50 04/09/2012   HGBA1C 6.2* 04/09/2012  Assessment & Plan:

## 2013-01-13 NOTE — Assessment & Plan Note (Signed)
Cardiac ECHO 

## 2013-01-14 ENCOUNTER — Telehealth: Payer: Self-pay | Admitting: Internal Medicine

## 2013-01-14 LAB — VITAMIN D 25 HYDROXY (VIT D DEFICIENCY, FRACTURES): Vit D, 25-Hydroxy: 28 ng/mL — ABNORMAL LOW (ref 30–89)

## 2013-01-14 MED ORDER — ERGOCALCIFEROL 1.25 MG (50000 UT) PO CAPS: 50000.0000 [IU] | ORAL_CAPSULE | ORAL | Status: DC

## 2013-01-14 NOTE — Telephone Encounter (Signed)
Left detailed mess informing pt of below.  

## 2013-01-14 NOTE — Telephone Encounter (Signed)
Louis Kemp, please, inform patient that all labs are normal except for a low vit D Star Vit D Thx

## 2013-01-24 ENCOUNTER — Ambulatory Visit (HOSPITAL_COMMUNITY): Payer: Medicare Other | Attending: Internal Medicine | Admitting: Radiology

## 2013-01-24 DIAGNOSIS — E119 Type 2 diabetes mellitus without complications: Secondary | ICD-10-CM | POA: Insufficient documentation

## 2013-01-24 DIAGNOSIS — R002 Palpitations: Secondary | ICD-10-CM | POA: Insufficient documentation

## 2013-01-24 DIAGNOSIS — R011 Cardiac murmur, unspecified: Secondary | ICD-10-CM

## 2013-01-24 DIAGNOSIS — I491 Atrial premature depolarization: Secondary | ICD-10-CM

## 2013-01-24 NOTE — Progress Notes (Signed)
Echocardiogram performed.  

## 2013-01-25 ENCOUNTER — Telehealth: Payer: Self-pay | Admitting: Internal Medicine

## 2013-01-25 NOTE — Telephone Encounter (Signed)
Stacey, please, inform patient that his ECHO was nl. Pls sign up for mychart Thx  

## 2013-01-27 NOTE — Telephone Encounter (Signed)
Pt informed

## 2013-02-10 ENCOUNTER — Other Ambulatory Visit: Payer: Self-pay | Admitting: Internal Medicine

## 2013-02-19 ENCOUNTER — Other Ambulatory Visit: Payer: Self-pay | Admitting: Internal Medicine

## 2013-02-19 LAB — HEMOGLOBIN A1C
Hgb A1c MFr Bld: 6.5 % — ABNORMAL HIGH (ref <5.7)
Mean Plasma Glucose: 140 mg/dL — ABNORMAL HIGH (ref <117)

## 2013-02-20 LAB — ANA: Anti Nuclear Antibody(ANA): NEGATIVE

## 2013-02-20 LAB — PSA: PSA: 0.42 ng/mL (ref <=4.00)

## 2013-02-20 LAB — SEDIMENTATION RATE: Sed Rate: 4 mm/hr (ref 0–16)

## 2013-02-20 LAB — RHEUMATOID FACTOR: Rhuematoid fact SerPl-aCnc: <10 IU/mL (ref <=14)

## 2013-02-21 ENCOUNTER — Ambulatory Visit (INDEPENDENT_AMBULATORY_CARE_PROVIDER_SITE_OTHER): Payer: Medicare Other | Admitting: Internal Medicine

## 2013-02-21 ENCOUNTER — Encounter: Payer: Self-pay | Admitting: Internal Medicine

## 2013-02-21 VITALS — BP 140/80 | HR 72 | Temp 97.4°F | Resp 16 | Wt 158.0 lb

## 2013-02-21 DIAGNOSIS — R002 Palpitations: Secondary | ICD-10-CM

## 2013-02-21 DIAGNOSIS — R209 Unspecified disturbances of skin sensation: Secondary | ICD-10-CM

## 2013-02-21 DIAGNOSIS — E119 Type 2 diabetes mellitus without complications: Secondary | ICD-10-CM

## 2013-02-21 DIAGNOSIS — R202 Paresthesia of skin: Secondary | ICD-10-CM

## 2013-02-21 DIAGNOSIS — N400 Enlarged prostate without lower urinary tract symptoms: Secondary | ICD-10-CM

## 2013-02-21 DIAGNOSIS — I491 Atrial premature depolarization: Secondary | ICD-10-CM

## 2013-02-21 DIAGNOSIS — E538 Deficiency of other specified B group vitamins: Secondary | ICD-10-CM

## 2013-02-21 DIAGNOSIS — I10 Essential (primary) hypertension: Secondary | ICD-10-CM

## 2013-02-21 MED ORDER — METFORMIN HCL 500 MG PO TABS: 500.0000 mg | ORAL_TABLET | Freq: Three times a day (TID) | ORAL | Status: DC

## 2013-02-21 MED ORDER — TERAZOSIN HCL 2 MG PO CAPS: 2.0000 mg | ORAL_CAPSULE | Freq: Every day | ORAL | Status: DC

## 2013-02-23 ENCOUNTER — Encounter: Payer: Self-pay | Admitting: Internal Medicine

## 2013-02-23 NOTE — Assessment & Plan Note (Signed)
Continue with current prescription therapy as reflected on the Med list.  

## 2013-02-23 NOTE — Progress Notes (Signed)
   Subjective:    Palpitations  This is a recurrent problem. The current episode started more than 1 month ago. The problem occurs daily. The problem has been unchanged. The symptoms are aggravated by exercise. Pertinent negatives include no chest pain, coughing, dizziness, nausea or weakness. Associated symptoms comments: None He checked his pulse and found irregular beats. He has tried nothing for the symptoms.      The patient presents for a follow-up of  Chronic vit D and B12 def, chronic dyslipidemia, type 2 diabetes controlled with medicines, neuropathy sx's. He retired in 4/13.  BP Readings from Last 3 Encounters:  02/21/13 140/80  01/13/13 140/82  10/22/12 158/80   Wt Readings from Last 3 Encounters:  02/21/13 158 lb (71.668 kg)  01/13/13 154 lb (69.854 kg)  10/22/12 152 lb (68.947 kg)      Review of Systems  Constitutional: Negative for appetite change, fatigue and unexpected weight change.  HENT: Negative for nosebleeds, congestion, sore throat, sneezing, trouble swallowing and neck pain.   Eyes: Negative for itching and visual disturbance.  Respiratory: Negative for cough.   Cardiovascular: Positive for palpitations. Negative for chest pain and leg swelling.  Gastrointestinal: Negative for nausea, diarrhea, blood in stool and abdominal distention.  Genitourinary: Negative for frequency and hematuria.  Musculoskeletal: Negative for back pain, joint swelling and gait problem.  Skin: Negative for rash.  Neurological: Negative for dizziness, tremors, speech difficulty and weakness.  Psychiatric/Behavioral: Negative for sleep disturbance, dysphoric mood and agitation. The patient is not nervous/anxious.        Objective:   Physical Exam  Constitutional: He is oriented to person, place, and time. He appears well-developed.  HENT:  Mouth/Throat: Oropharynx is clear and moist.  Eyes: Conjunctivae are normal. Pupils are equal, round, and reactive to light.  Neck:  Normal range of motion. No JVD present. No thyromegaly present.  Cardiovascular: Normal rate, regular rhythm, normal heart sounds and intact distal pulses.  Exam reveals no gallop and no friction rub.   No murmur heard. Pulmonary/Chest: Effort normal and breath sounds normal. No respiratory distress. He has no wheezes. He has no rales. He exhibits no tenderness.  Abdominal: Soft. Bowel sounds are normal. He exhibits no distension and no mass. There is no tenderness. There is no rebound and no guarding.  Musculoskeletal: Normal range of motion. He exhibits no edema and no tenderness.  Lymphadenopathy:    He has no cervical adenopathy.  Neurological: He is alert and oriented to person, place, and time. He has normal reflexes. No cranial nerve deficit. He exhibits normal muscle tone. Coordination normal.  Skin: Skin is warm and dry. No rash noted.  Psychiatric: He has a normal mood and affect. His behavior is normal. Judgment and thought content normal.    Lab Results  Component Value Date   WBC 6.8 10/16/2012   HGB 15.1 10/16/2012   HCT 42.4 10/16/2012   PLT 382 10/16/2012   GLUCOSE 100* 01/13/2013   CHOL 192 10/16/2012   TRIG 65 10/16/2012   HDL 53 10/16/2012   LDLCALC 126* 10/16/2012   ALT 21 10/16/2012   AST 25 10/16/2012   NA 139 01/13/2013   K 4.5 01/13/2013   CL 103 01/13/2013   CREATININE 1.22 01/13/2013   BUN 20 01/13/2013   CO2 28 01/13/2013   TSH 1.235 01/13/2013   PSA 0.42 02/19/2013   HGBA1C 6.5* 02/19/2013         Assessment & Plan:

## 2013-02-23 NOTE — Assessment & Plan Note (Signed)
On Cozaar 

## 2013-02-23 NOTE — Assessment & Plan Note (Signed)
On Hytrin 

## 2013-02-23 NOTE — Assessment & Plan Note (Signed)
Card consult Dr Eden Emms.

## 2013-02-23 NOTE — Assessment & Plan Note (Signed)
10/13 - new - mostly fingertips -- ?etiology Neg w/up so far. Declined Neurol consult

## 2013-03-21 ENCOUNTER — Encounter: Payer: Self-pay | Admitting: Cardiovascular Disease

## 2013-03-21 ENCOUNTER — Ambulatory Visit (INDEPENDENT_AMBULATORY_CARE_PROVIDER_SITE_OTHER): Payer: Medicare Other | Admitting: Cardiovascular Disease

## 2013-03-21 VITALS — BP 147/77 | HR 77 | Ht 67.5 in | Wt 157.0 lb

## 2013-03-21 DIAGNOSIS — I491 Atrial premature depolarization: Secondary | ICD-10-CM

## 2013-03-21 DIAGNOSIS — E785 Hyperlipidemia, unspecified: Secondary | ICD-10-CM

## 2013-03-21 DIAGNOSIS — R002 Palpitations: Secondary | ICD-10-CM

## 2013-03-21 DIAGNOSIS — E119 Type 2 diabetes mellitus without complications: Secondary | ICD-10-CM

## 2013-03-21 DIAGNOSIS — I10 Essential (primary) hypertension: Secondary | ICD-10-CM

## 2013-03-21 DIAGNOSIS — R011 Cardiac murmur, unspecified: Secondary | ICD-10-CM

## 2013-03-21 NOTE — Assessment & Plan Note (Signed)
Cholesterol is at goal.  Continue current dose of statin and diet Rx.  No myalgias or side effects.  F/U  LFT's in 6 months. Lab Results  Component Value Date   LDLCALC 126* 10/16/2012

## 2013-03-21 NOTE — Assessment & Plan Note (Signed)
Benign no significant valve diseae on echo

## 2013-03-21 NOTE — Progress Notes (Signed)
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  Noted skipped beats when he was bicycling.  No palpitations but pulse was irregular. Noted to have PAC;s on exam and ECG.  No history of CAD, valve disease or CHF.  Echo done this month reviewed and normal EF with no valve disease.  He is asymptomatic.  No chest pain or syncope.  Denies history of afib.  Or TIA;s  Does have DM. Recently strained his right knee and stopped using the bike when he noted his skipped beats.  Compliant with meds. Cheats on diet with rice at times.  A1c around 6.5  ROS: Denies fever, malais, weight loss, blurry vision, decreased visual acuity, cough, sputum, SOB, hemoptysis, pleuritic pain, palpitaitons, heartburn, abdominal pain, melena, lower extremity edema, claudication, or rash.  All other systems reviewed and negative   General: Affect appropriate Healthy:  appears stated age HEENT: normal Neck supple with no adenopathy JVP normal no bruits no thyromegaly Lungs clear with no wheezing and good diaphragmatic motion Heart:  S1/S2 soft systolic  murmur,rub, gallop or click PMI normal Abdomen: benighn, BS positve, no tenderness, no AAA no bruit.  No HSM or HJR Distal pulses intact with no bruits No edema Neuro non-focal Skin warm and dry No muscular weakness  Medications Current Outpatient Prescriptions  Medication Sig Dispense Refill  . aspirin 325 MG tablet Take 1 tablet (325 mg total) by mouth daily.  100 tablet  3  . Cholecalciferol 1000 UNITS tablet Take 2 tablets (2,000 Units total) by mouth daily.  100 tablet  3  . cyanocobalamin (,VITAMIN B-12,) 1000 MCG/ML injection Inject 1 mL (1,000 mcg total) into the muscle every 14 (fourteen) days.  10 mL  3  . fenofibrate (TRICOR) 145 MG tablet Take 1 tablet (145 mg total) by mouth daily.  90 tablet  3  . Insulin Syringe-Needle U-100 (B-D INS SYR MICROFINE 1CC/27G) 27G X 5/8" 1 ML MISC as directed.        Marland Kitchen losartan (COZAAR) 100 MG tablet Take 1 tablet (100 mg total) by mouth daily.   90 tablet  3  . metFORMIN (GLUCOPHAGE) 500 MG tablet Take 1 tablet (500 mg total) by mouth 3 (three) times daily.  270 tablet  3  . terazosin (HYTRIN) 2 MG capsule Take 1 capsule (2 mg total) by mouth at bedtime.  90 capsule  3   No current facility-administered medications for this visit.    Allergies Atorvastatin and Rosuvastatin  Family History: Family History  Problem Relation Age of Onset  . Hypertension Father   . Coronary artery disease Father   . Hypertension Brother   . Hypertension Other   . Arthritis Mother   . Hypertension Brother   . Coronary artery disease Other     Social History: History   Social History  . Marital Status: Married    Spouse Name: N/A    Number of Children: N/A  . Years of Education: N/A   Occupational History  . MD    Social History Main Topics  . Smoking status: Current Some Day Smoker    Types: Cigars  . Smokeless tobacco: Not on file  . Alcohol Use: No  . Drug Use: No  . Sexually Active: Not on file   Other Topics Concern  . Not on file   Social History Narrative   Regular Exercise -  YES, golf    Electrocardiogram:  SR normal except for frequent PAC;s  01/13/13  Assessment and Plan

## 2013-03-21 NOTE — Patient Instructions (Signed)
Your physician wants you to follow-up in:   6 MONTHS  WITH  DR NISHAN You will receive a reminder letter in the mail two months in advance. If you don't receive a letter, please call our office to schedule the follow-up appointment. Your physician recommends that you continue on your current medications as directed. Please refer to the Current Medication list given to you today.  Your physician has requested that you have an exercise tolerance test. For further information please visit [email or web address]. Please also follow instruction sheet, as given.   

## 2013-03-21 NOTE — Assessment & Plan Note (Signed)
Discussed low carb diet.  Target hemoglobin A1c is 6.5 or less.  Continue current medications.  

## 2013-03-21 NOTE — Assessment & Plan Note (Signed)
Benign in setting of no symptoms and normal echo. However he is at risk for PAF.  He knows how to take his pulse and will notify us if pulse is persitantly irregular. Would like to do ETT to make sure no more serious exercise induced arrhythmia and R/O CAD given DM.

## 2013-03-21 NOTE — Assessment & Plan Note (Signed)
Well controlled.  Continue current medications and low sodium Dash type diet.    

## 2013-04-14 ENCOUNTER — Encounter: Payer: Medicare Other | Admitting: Physician Assistant

## 2013-04-18 ENCOUNTER — Ambulatory Visit (INDEPENDENT_AMBULATORY_CARE_PROVIDER_SITE_OTHER): Payer: Medicare Other | Admitting: Physician Assistant

## 2013-04-18 DIAGNOSIS — R002 Palpitations: Secondary | ICD-10-CM

## 2013-04-18 NOTE — Procedures (Signed)
Exercise Treadmill Test  Pre-Exercise Testing Evaluation Rhythm: normal sinus  Rate: 71     Test  Exercise Tolerance Test Ordering MD: Charlton Haws, MD  Interpreting MD: Tereso Newcomer PA-C  Unique Test No: 1  Treadmill:  1  Indication for ETT: Palpitations  Contraindication to ETT: No   Stress Modality: exercise - treadmill  Cardiac Imaging Performed: non   Protocol: standard Bruce - maximal  Max BP:  190/74  Max MPHR (bpm):  149 85% MPR (bpm):  127  MPHR obtained (bpm):  148 % MPHR obtained:  100  Reached 85% MPHR (min:sec):  3:32 Total Exercise Time (min-sec):  7:01  Workload in METS:  8.1 Borg Scale: 16  Reason ETT Terminated:  desired heart rate attained    ST Segment Analysis At Rest: normal ST segments - no evidence of significant ST depression With Exercise: non-specific ST changes  Other Information Arrhythmia:  Runs of atrial tachycardia in recovery Angina during ETT:  absent (0) Quality of ETT:  diagnostic  ETT Interpretation:  normal - no evidence of ischemia by ST analysis  Comments: Good exercise tolerance. No chest pain. Normal BP response to exercise. Insignificant up-sloping ST depression.  No ST changes to suggest ischemia.  There were PACs and several runs of ATach noted in recovery.  He was asymptomatic.  Recommendations: F/u with Dr. Charlton Haws as planned. Luna Glasgow, PA-C  9:38 AM 04/18/2013

## 2013-04-21 ENCOUNTER — Other Ambulatory Visit: Payer: Self-pay | Admitting: *Deleted

## 2013-04-21 MED ORDER — NEBIVOLOL HCL 5 MG PO TABS: 5.0000 mg | ORAL_TABLET | Freq: Every day | ORAL | Status: DC

## 2013-04-21 NOTE — Telephone Encounter (Signed)
PT  AWARE OF  GXT  RESULTS ./CY 

## 2013-04-21 NOTE — Progress Notes (Signed)
PT  AWARE OF  GXT  RESULTS ./CY 

## 2013-04-22 ENCOUNTER — Other Ambulatory Visit: Payer: Self-pay | Admitting: *Deleted

## 2013-04-22 NOTE — Telephone Encounter (Signed)
S/w ed at [facility] pharm to make a change in pt medication bystolic 10 mg daily was sent in per Lawson Fiscal G..dg s/w pt directly phone call was reverted to [facility] [employer]

## 2013-04-22 NOTE — Telephone Encounter (Signed)
Received phone call today from Dr. Pearlean Brownie.  Prescription for Bystolic was called in yesterday for the 5 mg - to take one a day. This costs him $45 for his co-pay  He would like a 10 mg tablet to split.   His bottle will read 10 mg daily but he clearly understands that he will only take a half a tablet daily.  Patient is agreeable to this plan and will call if any problems develop in the interim.   Rosalio Macadamia, RN, ANP-C [facility] [address] [place], [place]  [address]

## 2013-04-24 ENCOUNTER — Other Ambulatory Visit: Payer: Self-pay | Admitting: Internal Medicine

## 2013-06-18 ENCOUNTER — Other Ambulatory Visit: Payer: Self-pay | Admitting: Gastroenterology

## 2013-06-18 DIAGNOSIS — R634 Abnormal weight loss: Secondary | ICD-10-CM

## 2013-06-20 ENCOUNTER — Ambulatory Visit
Admission: RE | Admit: 2013-06-20 | Discharge: 2013-06-20 | Disposition: A | Discharge status: Home / Self Care | Payer: Medicare Other | Source: Ambulatory Visit | Attending: Gastroenterology | Admitting: Gastroenterology

## 2013-06-20 DIAGNOSIS — R634 Abnormal weight loss: Secondary | ICD-10-CM

## 2013-06-20 MED ORDER — IOHEXOL 300 MG/ML  SOLN
100.0000 mL | Freq: Once | INTRAMUSCULAR | Status: AC | PRN
  Administered 2013-06-20: 100 mL via INTRAVENOUS

## 2013-06-24 ENCOUNTER — Ambulatory Visit (INDEPENDENT_AMBULATORY_CARE_PROVIDER_SITE_OTHER): Payer: Medicare Other | Admitting: Internal Medicine

## 2013-06-24 ENCOUNTER — Encounter: Payer: Self-pay | Admitting: Internal Medicine

## 2013-06-24 VITALS — BP 148/78 | HR 60 | Temp 97.6°F | Resp 16 | Wt 151.0 lb

## 2013-06-24 DIAGNOSIS — I491 Atrial premature depolarization: Secondary | ICD-10-CM

## 2013-06-24 DIAGNOSIS — E119 Type 2 diabetes mellitus without complications: Secondary | ICD-10-CM

## 2013-06-24 DIAGNOSIS — I10 Essential (primary) hypertension: Secondary | ICD-10-CM

## 2013-06-24 DIAGNOSIS — R634 Abnormal weight loss: Secondary | ICD-10-CM

## 2013-06-24 MED ORDER — METFORMIN HCL 500 MG PO TABS: 500.0000 mg | ORAL_TABLET | Freq: Two times a day (BID) | ORAL | Status: DC

## 2013-06-24 NOTE — Assessment & Plan Note (Signed)
Reduce metformin to bid

## 2013-06-24 NOTE — Progress Notes (Signed)
   Subjective:    HPI    The patient presents for a follow-up of  Chronic vit D and B12 def, chronic dyslipidemia, type 2 diabetes controlled with medicines, neuropathy sx's. He retired in 4/13. C/o wt loss and no appetite. He saw Dr Loreta Ave.  BP Readings from Last 3 Encounters:  06/24/13 148/78  03/21/13 147/77  02/21/13 140/80   Wt Readings from Last 3 Encounters:  06/24/13 151 lb (68.493 kg)  03/21/13 157 lb (71.215 kg)  02/21/13 158 lb (71.668 kg)      Review of Systems  Constitutional: Negative for appetite change, fatigue and unexpected weight change.  HENT: Negative for nosebleeds, congestion, sore throat, sneezing, trouble swallowing and neck pain.   Eyes: Negative for itching and visual disturbance.  Cardiovascular: Negative for leg swelling.  Gastrointestinal: Negative for diarrhea, blood in stool and abdominal distention.  Genitourinary: Negative for frequency and hematuria.  Musculoskeletal: Negative for back pain, joint swelling and gait problem.  Skin: Negative for rash.  Neurological: Negative for tremors and speech difficulty.  Psychiatric/Behavioral: Negative for sleep disturbance, dysphoric mood and agitation. The patient is not nervous/anxious.        Objective:   Physical Exam  Constitutional: He is oriented to person, place, and time. He appears well-developed.  HENT:  Mouth/Throat: Oropharynx is clear and moist.  Eyes: Conjunctivae are normal. Pupils are equal, round, and reactive to light.  Neck: Normal range of motion. No JVD present. No thyromegaly present.  Cardiovascular: Normal rate, regular rhythm, normal heart sounds and intact distal pulses.  Exam reveals no gallop and no friction rub.   No murmur heard. Pulmonary/Chest: Effort normal and breath sounds normal. No respiratory distress. He has no wheezes. He has no rales. He exhibits no tenderness.  Abdominal: Soft. Bowel sounds are normal. He exhibits no distension and no mass. There is no  tenderness. There is no rebound and no guarding.  Musculoskeletal: Normal range of motion. He exhibits no edema and no tenderness.  Lymphadenopathy:    He has no cervical adenopathy.  Neurological: He is alert and oriented to person, place, and time. He has normal reflexes. No cranial nerve deficit. He exhibits normal muscle tone. Coordination normal.  Skin: Skin is warm and dry. No rash noted.  Psychiatric: He has a normal mood and affect. His behavior is normal. Judgment and thought content normal.  looks well  Lab Results  Component Value Date   WBC 6.8 10/16/2012   HGB 15.1 10/16/2012   HCT 42.4 10/16/2012   PLT 382 10/16/2012   GLUCOSE 100* 01/13/2013   CHOL 192 10/16/2012   TRIG 65 10/16/2012   HDL 53 10/16/2012   LDLCALC 126* 10/16/2012   ALT 21 10/16/2012   AST 25 10/16/2012   NA 139 01/13/2013   K 4.5 01/13/2013   CL 103 01/13/2013   CREATININE 1.22 01/13/2013   BUN 20 01/13/2013   CO2 28 01/13/2013   TSH 1.235 01/13/2013   PSA 0.42 02/19/2013   HGBA1C 6.5* 02/19/2013    abd CT     Assessment & Plan:

## 2013-06-24 NOTE — Assessment & Plan Note (Signed)
Resolved

## 2013-06-24 NOTE — Assessment & Plan Note (Signed)
BP at home is ok

## 2013-06-24 NOTE — Patient Instructions (Signed)
Call if more wt loss

## 2013-06-24 NOTE — Assessment & Plan Note (Signed)
6/14 poss due to tid Metformin - go back to bid Pan[facility] pending Dr Loreta Ave  He had a CT, CXR

## 2013-07-11 ENCOUNTER — Encounter: Payer: Self-pay | Admitting: Internal Medicine

## 2013-09-16 ENCOUNTER — Telehealth: Payer: Self-pay | Admitting: Internal Medicine

## 2013-09-16 NOTE — Telephone Encounter (Signed)
Rec'd from Karin Golden forward 1 page to Dr.[name]

## 2013-10-18 ENCOUNTER — Other Ambulatory Visit: Payer: Self-pay | Admitting: Internal Medicine

## 2013-10-27 ENCOUNTER — Other Ambulatory Visit: Payer: Self-pay | Admitting: Internal Medicine

## 2013-11-24 ENCOUNTER — Ambulatory Visit (INDEPENDENT_AMBULATORY_CARE_PROVIDER_SITE_OTHER): Payer: Medicare Other | Admitting: Internal Medicine

## 2013-11-24 ENCOUNTER — Other Ambulatory Visit (INDEPENDENT_AMBULATORY_CARE_PROVIDER_SITE_OTHER): Payer: Medicare Other

## 2013-11-24 ENCOUNTER — Encounter: Payer: Self-pay | Admitting: Internal Medicine

## 2013-11-24 VITALS — BP 150/84 | HR 72 | Temp 97.9°F | Resp 16 | Wt 147.0 lb

## 2013-11-24 DIAGNOSIS — M1710 Unilateral primary osteoarthritis, unspecified knee: Secondary | ICD-10-CM

## 2013-11-24 DIAGNOSIS — IMO0002 Reserved for concepts with insufficient information to code with codable children: Secondary | ICD-10-CM

## 2013-11-24 DIAGNOSIS — E538 Deficiency of other specified B group vitamins: Secondary | ICD-10-CM

## 2013-11-24 DIAGNOSIS — E559 Vitamin D deficiency, unspecified: Secondary | ICD-10-CM

## 2013-11-24 DIAGNOSIS — E119 Type 2 diabetes mellitus without complications: Secondary | ICD-10-CM

## 2013-11-24 DIAGNOSIS — I491 Atrial premature depolarization: Secondary | ICD-10-CM

## 2013-11-24 DIAGNOSIS — Z23 Encounter for immunization: Secondary | ICD-10-CM

## 2013-11-24 DIAGNOSIS — R011 Cardiac murmur, unspecified: Secondary | ICD-10-CM

## 2013-11-24 DIAGNOSIS — R002 Palpitations: Secondary | ICD-10-CM

## 2013-11-24 LAB — BASIC METABOLIC PANEL
BUN: 17 mg/dL (ref 6–23)
CO2: 27 mEq/L (ref 19–32)
Calcium: 9.5 mg/dL (ref 8.4–10.5)
Chloride: 105 mEq/L (ref 96–112)
Creatinine, Ser: 1.5 mg/dL (ref 0.4–1.5)
GFR: 50.1 mL/min — ABNORMAL LOW (ref >60.00)
Glucose, Bld: 124 mg/dL — ABNORMAL HIGH (ref 70–99)
Potassium: 4.5 mEq/L (ref 3.5–5.1)
Sodium: 139 mEq/L (ref 135–145)

## 2013-11-24 LAB — HEMOGLOBIN A1C: Hgb A1c MFr Bld: 6.4 % (ref 4.6–6.5)

## 2013-11-24 LAB — TSH: TSH: 1.35 u[IU]/mL (ref 0.35–5.50)

## 2013-11-24 LAB — VITAMIN D 25 HYDROXY (VIT D DEFICIENCY, FRACTURES): Vit D, 25-Hydroxy: 34 ng/mL (ref 30–89)

## 2013-11-24 LAB — MAGNESIUM: Magnesium: 1.9 mg/dL (ref 1.5–2.5)

## 2013-11-24 MED ORDER — FENOFIBRATE 145 MG PO TABS: 145.0000 mg | ORAL_TABLET | Freq: Every day | ORAL | Status: DC

## 2013-11-24 MED ORDER — NEBIVOLOL HCL 20 MG PO TABS: 20.0000 mg | ORAL_TABLET | Freq: Every day | ORAL | Status: DC

## 2013-11-24 NOTE — Assessment & Plan Note (Signed)
Much better - no relapse

## 2013-11-24 NOTE — Assessment & Plan Note (Signed)
12/14 R>L Dr Jerelyn Scott TKR

## 2013-11-24 NOTE — Assessment & Plan Note (Signed)
Continue with current prescription therapy as reflected on the Med list.  

## 2013-11-24 NOTE — Progress Notes (Signed)
   Subjective:    HPI    The patient presents for a follow-up of  Chronic vit D and B12 def, chronic dyslipidemia, type 2 diabetes controlled with medicines, neuropathy sx's. He retired in 4/13. F/u wt loss and no appetite. He saw Dr Loreta Ave.  C/o knee OA  BP is nl at home  BP Readings from Last 3 Encounters:  11/24/13 150/84  06/24/13 148/78  03/21/13 147/77   Wt Readings from Last 3 Encounters:  11/24/13 147 lb (66.679 kg)  06/24/13 151 lb (68.493 kg)  03/21/13 157 lb (71.215 kg)      Review of Systems  Constitutional: Negative for appetite change, fatigue and unexpected weight change.  HENT: Negative for congestion, nosebleeds, sneezing, sore throat and trouble swallowing.   Eyes: Negative for itching and visual disturbance.  Cardiovascular: Negative for leg swelling.  Gastrointestinal: Negative for diarrhea, blood in stool and abdominal distention.  Genitourinary: Negative for frequency and hematuria.  Musculoskeletal: Negative for back pain, gait problem, joint swelling and neck pain.  Skin: Negative for rash.  Neurological: Negative for tremors and speech difficulty.  Psychiatric/Behavioral: Negative for sleep disturbance, dysphoric mood and agitation. The patient is not nervous/anxious.        Objective:   Physical Exam  Constitutional: He is oriented to person, place, and time. He appears well-developed.  HENT:  Mouth/Throat: Oropharynx is clear and moist.  Eyes: Conjunctivae are normal. Pupils are equal, round, and reactive to light.  Neck: Normal range of motion. No JVD present. No thyromegaly present.  Cardiovascular: Normal rate, regular rhythm, normal heart sounds and intact distal pulses.  Exam reveals no gallop and no friction rub.   No murmur heard. Pulmonary/Chest: Effort normal and breath sounds normal. No respiratory distress. He has no wheezes. He has no rales. He exhibits no tenderness.  Abdominal: Soft. Bowel sounds are normal. He exhibits no  distension and no mass. There is no tenderness. There is no rebound and no guarding.  Musculoskeletal: Normal range of motion. He exhibits no edema and no tenderness.  Lymphadenopathy:    He has no cervical adenopathy.  Neurological: He is alert and oriented to person, place, and time. He has normal reflexes. No cranial nerve deficit. He exhibits normal muscle tone. Coordination normal.  Skin: Skin is warm and dry. No rash noted.  Psychiatric: He has a normal mood and affect. His behavior is normal. Judgment and thought content normal.  looks well R knee is tender w/ROM  Lab Results  Component Value Date   WBC 6.8 10/16/2012   HGB 15.1 10/16/2012   HCT 42.4 10/16/2012   PLT 382 10/16/2012   GLUCOSE 100* 01/13/2013   CHOL 192 10/16/2012   TRIG 65 10/16/2012   HDL 53 10/16/2012   LDLCALC 126* 10/16/2012   ALT 21 10/16/2012   AST 25 10/16/2012   NA 139 01/13/2013   K 4.5 01/13/2013   CL 103 01/13/2013   CREATININE 1.22 01/13/2013   BUN 20 01/13/2013   CO2 28 01/13/2013   TSH 1.235 01/13/2013   PSA 0.42 02/19/2013   HGBA1C 6.5* 02/19/2013    abd CT     Assessment & Plan:

## 2013-11-24 NOTE — Progress Notes (Signed)
Pre visit review using our clinic review tool, if applicable. No additional management support is needed unless otherwise documented below in the visit note. 

## 2013-11-25 ENCOUNTER — Encounter: Payer: Self-pay | Admitting: Internal Medicine

## 2014-03-25 ENCOUNTER — Ambulatory Visit (INDEPENDENT_AMBULATORY_CARE_PROVIDER_SITE_OTHER): Payer: Medicare Other | Admitting: Internal Medicine

## 2014-03-25 ENCOUNTER — Encounter: Payer: Self-pay | Admitting: Internal Medicine

## 2014-03-25 ENCOUNTER — Other Ambulatory Visit (INDEPENDENT_AMBULATORY_CARE_PROVIDER_SITE_OTHER): Payer: Medicare Other

## 2014-03-25 VITALS — BP 140/72 | HR 72 | Temp 97.9°F | Resp 16 | Wt 147.0 lb

## 2014-03-25 DIAGNOSIS — M179 Osteoarthritis of knee, unspecified: Secondary | ICD-10-CM

## 2014-03-25 DIAGNOSIS — E538 Deficiency of other specified B group vitamins: Secondary | ICD-10-CM

## 2014-03-25 DIAGNOSIS — E785 Hyperlipidemia, unspecified: Secondary | ICD-10-CM

## 2014-03-25 DIAGNOSIS — E119 Type 2 diabetes mellitus without complications: Secondary | ICD-10-CM

## 2014-03-25 DIAGNOSIS — Z23 Encounter for immunization: Secondary | ICD-10-CM

## 2014-03-25 DIAGNOSIS — I491 Atrial premature depolarization: Secondary | ICD-10-CM

## 2014-03-25 DIAGNOSIS — M1710 Unilateral primary osteoarthritis, unspecified knee: Secondary | ICD-10-CM

## 2014-03-25 DIAGNOSIS — E559 Vitamin D deficiency, unspecified: Secondary | ICD-10-CM

## 2014-03-25 DIAGNOSIS — IMO0002 Reserved for concepts with insufficient information to code with codable children: Secondary | ICD-10-CM

## 2014-03-25 LAB — BASIC METABOLIC PANEL
BUN: 18 mg/dL (ref 6–23)
CO2: 28 mEq/L (ref 19–32)
Calcium: 9.8 mg/dL (ref 8.4–10.5)
Chloride: 104 mEq/L (ref 96–112)
Creatinine, Ser: 1.4 mg/dL (ref 0.4–1.5)
GFR: 52.95 mL/min — ABNORMAL LOW (ref >60.00)
Glucose, Bld: 115 mg/dL — ABNORMAL HIGH (ref 70–99)
Potassium: 4.2 mEq/L (ref 3.5–5.1)
Sodium: 137 mEq/L (ref 135–145)

## 2014-03-25 LAB — HEMOGLOBIN A1C: Hgb A1c MFr Bld: 6.3 % (ref 4.6–6.5)

## 2014-03-25 NOTE — Assessment & Plan Note (Signed)
Doing well 

## 2014-03-25 NOTE — Assessment & Plan Note (Signed)
Cont w/krill oil

## 2014-03-25 NOTE — Assessment & Plan Note (Signed)
Continue with current prescription therapy as reflected on the Med list.  

## 2014-03-25 NOTE — Progress Notes (Signed)
Patient ID: Louis Kemp, male   DOB: Jun 08, 1941, 73 y.o.   MRN: [id number]   Subjective:    HPI    The patient presents for a follow-up of  Chronic vit D and B12 def, chronic dyslipidemia, type 2 diabetes controlled with medicines, neuropathy sx's. He retired in 4/13. F/u wt loss and no appetite. He saw Dr Collene Mares.  F/u knee OA  BP is nl at home  BP Readings from Last 3 Encounters:  03/25/14 140/72  11/24/13 150/84  06/24/13 148/78   Wt Readings from Last 3 Encounters:  03/25/14 147 lb (66.679 kg)  11/24/13 147 lb (66.679 kg)  06/24/13 151 lb (68.493 kg)      Review of Systems  Constitutional: Negative for appetite change, fatigue and unexpected weight change.  HENT: Negative for congestion, nosebleeds, sneezing, sore throat and trouble swallowing.   Eyes: Negative for itching and visual disturbance.  Cardiovascular: Negative for leg swelling.  Gastrointestinal: Negative for diarrhea, blood in stool and abdominal distention.  Genitourinary: Negative for frequency and hematuria.  Musculoskeletal: Negative for back pain, gait problem, joint swelling and neck pain.  Skin: Negative for rash.  Neurological: Negative for tremors and speech difficulty.  Psychiatric/Behavioral: Negative for sleep disturbance, dysphoric mood and agitation. The patient is not nervous/anxious.        Objective:   Physical Exam  Constitutional: He is oriented to person, place, and time. He appears well-developed.  HENT:  Mouth/Throat: Oropharynx is clear and moist.  Eyes: Conjunctivae are normal. Pupils are equal, round, and reactive to light.  Neck: Normal range of motion. No JVD present. No thyromegaly present.  Cardiovascular: Normal rate, regular rhythm, normal heart sounds and intact distal pulses.  Exam reveals no gallop and no friction rub.   No murmur heard. Pulmonary/Chest: Effort normal and breath sounds normal. No respiratory distress. He has no wheezes. He has no rales. He exhibits no  tenderness.  Abdominal: Soft. Bowel sounds are normal. He exhibits no distension and no mass. There is no tenderness. There is no rebound and no guarding.  Musculoskeletal: Normal range of motion. He exhibits no edema and no tenderness.  Lymphadenopathy:    He has no cervical adenopathy.  Neurological: He is alert and oriented to person, place, and time. He has normal reflexes. No cranial nerve deficit. He exhibits normal muscle tone. Coordination normal.  Skin: Skin is warm and dry. No rash noted.  Psychiatric: He has a normal mood and affect. His behavior is normal. Judgment and thought content normal.  looks well R knee is tender w/ROM  Lab Results  Component Value Date   WBC 6.8 10/16/2012   HGB 15.1 10/16/2012   HCT 42.4 10/16/2012   PLT 382 10/16/2012   GLUCOSE 124* 11/24/2013   CHOL 192 10/16/2012   TRIG 65 10/16/2012   HDL 53 10/16/2012   LDLCALC 126* 10/16/2012   ALT 21 10/16/2012   AST 25 10/16/2012   NA 139 11/24/2013   K 4.5 11/24/2013   CL 105 11/24/2013   CREATININE 1.5 11/24/2013   BUN 17 11/24/2013   CO2 27 11/24/2013   TSH 1.35 11/24/2013   PSA 0.42 02/19/2013   HGBA1C 6.4 11/24/2013    abd CT     Assessment & Plan:

## 2014-03-25 NOTE — Progress Notes (Signed)
Pre visit review using our clinic review tool, if applicable. No additional management support is needed unless otherwise documented below in the visit note. 

## 2014-04-21 ENCOUNTER — Telehealth: Payer: Self-pay | Admitting: Internal Medicine

## 2014-04-21 ENCOUNTER — Other Ambulatory Visit: Payer: Self-pay | Admitting: Internal Medicine

## 2014-04-21 MED ORDER — CARVEDILOL 25 MG PO TABS: 25.0000 mg | ORAL_TABLET | Freq: Two times a day (BID) | ORAL | Status: DC

## 2014-04-21 NOTE — Telephone Encounter (Signed)
Bystolic is too $$$ Will switch to Coreg 25 mg bid. Start w/1/2 tab bid Pt is aware

## 2014-06-05 ENCOUNTER — Encounter (HOSPITAL_COMMUNITY): Payer: Self-pay | Admitting: Pharmacy Technician

## 2014-06-05 NOTE — H&P (Signed)
TOTAL KNEE ADMISSION H&P  Patient is being admitted for right total knee arthroplasty.  Subjective:  Chief Complaint:    Right knee OA / pain.  HPI: Louis Kemp, 73 y.o. male, has a history of pain and functional disability in the right knee due to arthritis and has failed non-surgical conservative treatments for greater than 12 weeks to include NSAID's and/or analgesics, corticosteriod injections and activity modification.  Onset of symptoms was gradual, starting 8 months ago with gradually worsening course since that time. The patient noted no past surgery on the right knee(s).  Patient currently rates pain in the right knee(s) at 6 out of 10 with activity. Patient has worsening of pain with activity and weight bearing, pain that interferes with activities of daily living, crepitus and joint swelling.  Patient has evidence of periarticular osteophytes and joint space narrowing by imaging studies.  There is no active infection.  Risks, benefits and expectations were discussed with the patient.  Risks including but not limited to the risk of anesthesia, blood clots, nerve damage, blood vessel damage, failure of the prosthesis, infection and up to and including death.  Patient understand the risks, benefits and expectations and wishes to proceed with surgery.   PCP: Walker Kehr, MD  D/C Plans:      Home with HHPT  Post-op Meds:       No Rx given   Tranexamic Acid:      Is to be given  Decadron:      Is not to be given - DM  FYI:     ASA post-op  Norco post-op   Patient Active Problem List   Diagnosis Date Noted  . Osteoarthritis, knee 11/24/2013  . Weight loss 06/24/2013  . PAC (premature atrial contraction) 01/13/2013  . Heart murmur 01/13/2013  . Well adult exam 11/03/2012  . Paresthesia 10/22/2012  . Hypertension 04/18/2012  . DIABETES MELLITUS, TYPE II 10/20/2010  . VITAMIN B12 DEFICIENCY 10/20/2010  . VITAMIN D DEFICIENCY 10/20/2010  . HYPERLIPIDEMIA 07/14/2010  . BENIGN  PROSTATIC HYPERTROPHY 07/14/2010  . OSTEOARTHRITIS 07/14/2010  . LOW BACK PAIN 07/14/2010  . HYPERGLYCEMIA 07/14/2010   Past Medical History  Diagnosis Date  . BPH (benign prostatic hypertrophy)   . Hyperlipidemia   . Statin intolerance   . LBP (low back pain)   . Osteoarthritis   . MVP (mitral valve prolapse)   . Vitiligo   . Vitamin D deficiency   . Vitamin B 12 deficiency 2011  . Type II or unspecified type diabetes mellitus without mention of complication, not stated as uncontrolled 2011    Past Surgical History  Procedure Laterality Date  . Cataract extraction  2012    No prescriptions prior to admission   Allergies  Allergen Reactions  . Atorvastatin     REACTION: aches  . Rosuvastatin     REACTION: aches    History  Substance Use Topics  . Smoking status: Current Some Day Smoker    Types: Cigars  . Smokeless tobacco: Not on file  . Alcohol Use: No    Family History  Problem Relation Age of Onset  . Hypertension Father   . Coronary artery disease Father   . Hypertension Brother   . Hypertension Other   . Arthritis Mother   . Hypertension Brother   . Coronary artery disease Other      Review of Systems  Constitutional: Negative.   HENT: Negative.   Eyes: Negative.   Respiratory: Negative.   Cardiovascular: Negative.  Gastrointestinal: Negative.   Genitourinary: Positive for frequency.  Musculoskeletal: Positive for back pain and joint pain.  Skin: Negative.   Neurological: Negative.   Endo/Heme/Allergies: Negative.   Psychiatric/Behavioral: Negative.     Objective:  Physical Exam  Constitutional: He is oriented to person, place, and time. He appears well-developed and well-nourished.  HENT:  Head: Normocephalic and atraumatic.  Mouth/Throat: Oropharynx is clear and moist.  Eyes: Pupils are equal, round, and reactive to light.  Neck: Neck supple. No JVD present. No tracheal deviation present. No thyromegaly present.  Cardiovascular: Normal  rate, regular rhythm and intact distal pulses.   Murmur heard. Respiratory: Effort normal and breath sounds normal. No respiratory distress. He has no wheezes.  GI: Soft. There is no tenderness. There is no guarding.  Musculoskeletal:       Right knee: He exhibits decreased range of motion, swelling and bony tenderness. He exhibits no ecchymosis, no deformity, no laceration and no erythema. Tenderness found.  Lymphadenopathy:    He has no cervical adenopathy.  Neurological: He is alert and oriented to person, place, and time.  Skin: Skin is warm and dry.  Psychiatric: He has a normal mood and affect.    Labs:  Estimated body mass index is 22.67 kg/(m^2) as calculated from the following:   Height as of 03/21/13: 5' 7.5" (1.715 m).   Weight as of 03/25/14: 66.679 kg (147 lb).   Imaging Review Plain radiographs demonstrate severe degenerative joint disease of the right knee(s). The overall alignment is neutral. The bone quality appears to be good for age and reported activity level.  Assessment/Plan:  End stage arthritis, right knee   The patient history, physical examination, clinical judgment of the provider and imaging studies are consistent with end stage degenerative joint disease of the right knee(s) and total knee arthroplasty is deemed medically necessary. The treatment options including medical management, injection therapy arthroscopy and arthroplasty were discussed at length. The risks and benefits of total knee arthroplasty were presented and reviewed. The risks due to aseptic loosening, infection, stiffness, patella tracking problems, thromboembolic complications and other imponderables were discussed. The patient acknowledged the explanation, agreed to proceed with the plan and consent was signed. Patient is being admitted for inpatient treatment for surgery, pain control, PT, OT, prophylactic antibiotics, VTE prophylaxis, progressive ambulation and ADL's and discharge planning.  The patient is planning to be discharged home with home health services.     West Pugh Babish   PA-C  06/05/2014, 10:45 AM

## 2014-06-10 ENCOUNTER — Other Ambulatory Visit (HOSPITAL_COMMUNITY): Payer: Self-pay | Admitting: Orthopedic Surgery

## 2014-06-10 ENCOUNTER — Other Ambulatory Visit: Payer: Self-pay

## 2014-06-10 ENCOUNTER — Encounter (HOSPITAL_COMMUNITY): Payer: Self-pay

## 2014-06-10 ENCOUNTER — Ambulatory Visit (HOSPITAL_COMMUNITY)
Admission: RE | Admit: 2014-06-10 | Discharge: 2014-06-10 | Disposition: A | Discharge status: Home / Self Care | Payer: Medicare Other | Source: Ambulatory Visit | Attending: Anesthesiology | Admitting: Anesthesiology

## 2014-06-10 ENCOUNTER — Encounter (HOSPITAL_COMMUNITY)
Admission: RE | Admit: 2014-06-10 | Discharge: 2014-06-10 | Disposition: A | Discharge status: Home / Self Care | Payer: Medicare Other | Source: Ambulatory Visit | Attending: Orthopedic Surgery | Admitting: Orthopedic Surgery

## 2014-06-10 DIAGNOSIS — J8410 Pulmonary fibrosis, unspecified: Secondary | ICD-10-CM | POA: Insufficient documentation

## 2014-06-10 DIAGNOSIS — Z01818 Encounter for other preprocedural examination: Secondary | ICD-10-CM | POA: Insufficient documentation

## 2014-06-10 DIAGNOSIS — Z01812 Encounter for preprocedural laboratory examination: Secondary | ICD-10-CM | POA: Insufficient documentation

## 2014-06-10 DIAGNOSIS — Z01810 Encounter for preprocedural cardiovascular examination: Secondary | ICD-10-CM | POA: Insufficient documentation

## 2014-06-10 HISTORY — DX: Atrial premature depolarization: I49.1

## 2014-06-10 HISTORY — DX: Essential (primary) hypertension: I10

## 2014-06-10 LAB — SURGICAL PCR SCREEN
MRSA, PCR: NEGATIVE
Staphylococcus aureus: NEGATIVE

## 2014-06-10 LAB — BASIC METABOLIC PANEL
BUN: 19 mg/dL (ref 6–23)
CO2: 24 mEq/L (ref 19–32)
Calcium: 9.9 mg/dL (ref 8.4–10.5)
Chloride: 107 mEq/L (ref 96–112)
Creatinine, Ser: 1.42 mg/dL — ABNORMAL HIGH (ref 0.50–1.35)
GFR calc Af Amer: 55 mL/min — ABNORMAL LOW (ref >90)
GFR calc non Af Amer: 48 mL/min — ABNORMAL LOW (ref >90)
Glucose, Bld: 136 mg/dL — ABNORMAL HIGH (ref 70–99)
Potassium: 4.3 mEq/L (ref 3.7–5.3)
Sodium: 144 mEq/L (ref 137–147)

## 2014-06-10 LAB — URINALYSIS, ROUTINE W REFLEX MICROSCOPIC
Bilirubin Urine: NEGATIVE
Glucose, UA: NEGATIVE mg/dL
Hgb urine dipstick: NEGATIVE
Ketones, ur: NEGATIVE mg/dL
Leukocytes, UA: NEGATIVE
Nitrite: NEGATIVE
Protein, ur: NEGATIVE mg/dL
Specific Gravity, Urine: 1.021 (ref 1.005–1.030)
Urobilinogen, UA: 0.2 mg/dL (ref 0.0–1.0)
pH: 5 (ref 5.0–8.0)

## 2014-06-10 LAB — CBC
HCT: 42.7 % (ref 39.0–52.0)
Hemoglobin: 14.5 g/dL (ref 13.0–17.0)
MCH: 30.3 pg (ref 26.0–34.0)
MCHC: 34 g/dL (ref 30.0–36.0)
MCV: 89.1 fL (ref 78.0–100.0)
Platelets: 292 10*3/uL (ref 150–400)
RBC: 4.79 MIL/uL (ref 4.22–5.81)
RDW: 12.8 % (ref 11.5–15.5)
WBC: 6.4 10*3/uL (ref 4.0–10.5)

## 2014-06-10 LAB — PROTIME-INR
INR: 1.01 (ref 0.00–1.49)
Prothrombin Time: 13.1 seconds (ref 11.6–15.2)

## 2014-06-10 LAB — APTT: aPTT: 30 seconds (ref 24–37)

## 2014-06-10 NOTE — Patient Instructions (Signed)
Your procedure is scheduled on: 06/15/14  MONDAY   Report to [facility] at 07:55      AM.   Call this number if you have problems the morning of surgery: [phone]        Do not eat food  Or drink :After Midnight.SUNDAY NIGHT   Take these medicines the morning of surgery with A SIP OF WATER: BYSTOLIC, TERAZOSIN   DO NOT TAKE ANY DIABETIC MEDICATION MORNING OF SURGERY.  Contacts, dentures or partial plates, or metal hairpins  can not be worn to surgery. Your family will be responsible for glasses, dentures, hearing aides while you are in surgery  Leave suitcase in the car. After surgery it may be brought to your room.  For patients admitted to the hospital, checkout time is 11:00 AM day of  discharge.         [facility] IS NOT RESPONSIBLE FOR ANY VALUABLES                                                                                                                                          [facility] - Preparing for Surgery Before surgery, you can play an important role.  Because skin is not sterile, your skin needs to be as free of germs as possible.  You can reduce the number of germs on your skin by washing with CHG (chlorahexidine gluconate) soap before surgery.  CHG is an antiseptic cleaner which kills germs and bonds with the skin to continue killing germs even after washing. Please DO NOT use if you have an allergy to CHG or antibacterial soaps.  If your skin becomes reddened/irritated stop using the CHG and inform your nurse when you arrive at Short Stay. Do not shave (including legs and underarms) for at least 48 hours prior to the first CHG shower.  You may shave your face/neck. Please follow these instructions carefully:  1.  Shower with CHG Soap the night before surgery and the  morning of Surgery.  2.  If you choose to wash your hair, wash your hair first as usual with your  normal   shampoo.  3.  After you shampoo, rinse your hair and body thoroughly to remove the  shampoo.                           4.  Use CHG as you would any other liquid soap.  You can apply chg directly  to the skin and wash                       Gently with a scrungie or clean washcloth.  5.  Apply the CHG Soap to your body ONLY FROM THE NECK DOWN.   Do not  use on face/ open                           Wound or open sores. Avoid contact with eyes, ears mouth and genitals (private parts).                       Wash face,  Genitals (private parts) with your normal soap.             6.  Wash thoroughly, paying special attention to the area where your surgery  will be performed.  7.  Thoroughly rinse your body with warm water from the neck down.  8.  DO NOT shower/wash with your normal soap after using and rinsing off  the CHG Soap.                9.  Pat yourself dry with a clean towel.            10.  Wear clean pajamas.            11.  Place clean sheets on your bed the night of your first shower and do not  sleep with pets. Day of Surgery : Do not apply any lotions/deodorants the morning of surgery.  Please wear clean clothes to the hospital/surgery center.  FAILURE TO FOLLOW THESE INSTRUCTIONS MAY RESULT IN THE CANCELLATION OF YOUR SURGERY PATIENT SIGNATURE_________________________________  NURSE SIGNATURE__________________________________  ________________________________________________________________________  WHAT IS A BLOOD TRANSFUSION? Blood Transfusion Information  A transfusion is the replacement of blood or some of its parts. Blood is made up of multiple cells which provide different functions.  Red blood cells carry oxygen and are used for blood loss replacement.  White blood cells fight against infection.  Platelets control bleeding.  Plasma helps clot blood.  Other blood products are available for specialized needs, such as hemophilia or other clotting disorders. BEFORE THE  TRANSFUSION  Who gives blood for transfusions?   Healthy volunteers who are fully evaluated to make sure their blood is safe. This is blood bank blood. Transfusion therapy is the safest it has ever been in the practice of medicine. Before blood is taken from a donor, a complete history is taken to make sure that person has no history of diseases nor engages in risky social behavior (examples are intravenous drug use or sexual activity with multiple partners). The donor's travel history is screened to minimize risk of transmitting infections, such as malaria. The donated blood is tested for signs of infectious diseases, such as HIV and hepatitis. The blood is then tested to be sure it is compatible with you in order to minimize the chance of a transfusion reaction. If you or a relative donates blood, this is often done in anticipation of surgery and is not appropriate for emergency situations. It takes many days to process the donated blood. RISKS AND COMPLICATIONS Although transfusion therapy is very safe and saves many lives, the main dangers of transfusion include:   Getting an infectious disease.  Developing a transfusion reaction. This is an allergic reaction to something in the blood you were given. Every precaution is taken to prevent this. The decision to have a blood transfusion has been considered carefully by your caregiver before blood is given. Blood is not given unless the benefits outweigh the risks. AFTER THE TRANSFUSION  Right after receiving a blood transfusion, you will usually feel much better and more energetic. This is especially true if  your red blood cells have gotten low (anemic). The transfusion raises the level of the red blood cells which carry oxygen, and this usually causes an energy increase.  The nurse administering the transfusion will monitor you carefully for complications. HOME CARE INSTRUCTIONS  No special instructions are needed after a transfusion. You may find  your energy is better. Speak with your caregiver about any limitations on activity for underlying diseases you may have. SEEK MEDICAL CARE IF:   Your condition is not improving after your transfusion.  You develop redness or irritation at the intravenous (IV) site. SEEK IMMEDIATE MEDICAL CARE IF:  Any of the following symptoms occur over the next 12 hours:  Shaking chills.  You have a temperature by mouth above 102 F (38.9 C), not controlled by medicine.  Chest, back, or muscle pain.  People around you feel you are not acting correctly or are confused.  Shortness of breath or difficulty breathing.  Dizziness and fainting.  You get a rash or develop hives.  You have a decrease in urine output.  Your urine turns a dark color or changes to pink, red, or brown. Any of the following symptoms occur over the next 10 days:  You have a temperature by mouth above 102 F (38.9 C), not controlled by medicine.  Shortness of breath.  Weakness after normal activity.  The white part of the eye turns yellow (jaundice).  You have a decrease in the amount of urine or are urinating less often.  Your urine turns a dark color or changes to pink, red, or brown. Document Released: 12/08/2000 Document Revised: 03/04/2012 Document Reviewed: 07/27/2008 Exit[employer] Patient Information 2014 [place].  _______________________________________________________________________  Incentive Spirometer  An incentive spirometer is a tool that can help keep your lungs clear and active. This tool measures how well you are filling your lungs with each breath. Taking long deep breaths may help reverse or decrease the chance of developing breathing (pulmonary) problems (especially infection) following:  A long period of time when you are unable to move or be active. BEFORE THE PROCEDURE   If the spirometer includes an indicator to show your best effort, your nurse or respiratory therapist will set it  to a desired goal.  If possible, sit up straight or lean slightly forward. Try not to slouch.  Hold the incentive spirometer in an upright position. INSTRUCTIONS FOR USE  1. Sit on the edge of your bed if possible, or sit up as far as you can in bed or on a chair. 2. Hold the incentive spirometer in an upright position. 3. Breathe out normally. 4. Place the mouthpiece in your mouth and seal your lips tightly around it. 5. Breathe in slowly and as deeply as possible, raising the piston or the ball toward the top of the column. 6. Hold your breath for 3-5 seconds or for as long as possible. Allow the piston or ball to fall to the bottom of the column. 7. Remove the mouthpiece from your mouth and breathe out normally. 8. Rest for a few seconds and repeat Steps 1 through 7 at least 10 times every 1-2 hours when you are awake. Take your time and take a few normal breaths between deep breaths. 9. The spirometer may include an indicator to show your best effort. Use the indicator as a goal to work toward during each repetition. 10. After each set of 10 deep breaths, practice coughing to be sure your lungs are clear. If you have an incision (  the cut made at the time of surgery), support your incision when coughing by placing a pillow or rolled up towels firmly against it. Once you are able to get out of bed, walk around indoors and cough well. You may stop using the incentive spirometer when instructed by your caregiver.  RISKS AND COMPLICATIONS  Take your time so you do not get dizzy or light-headed.  If you are in pain, you may need to take or ask for pain medication before doing incentive spirometry. It is harder to take a deep breath if you are having pain. AFTER USE  Rest and breathe slowly and easily.  It can be helpful to keep track of a log of your progress. Your caregiver can provide you with a simple table to help with this. If you are using the spirometer at home, follow these  instructions: [place] IF:   You are having difficultly using the spirometer.  You have trouble using the spirometer as often as instructed.  Your pain medication is not giving enough relief while using the spirometer.  You develop fever of 100.5 F (38.1 C) or higher. SEEK IMMEDIATE MEDICAL CARE IF:   You cough up bloody sputum that had not been present before.  You develop fever of 102 F (38.9 C) or greater.  You develop worsening pain at or near the incision site. MAKE SURE YOU:   Understand these instructions.  Will watch your condition.  Will get help right away if you are not doing well or get worse. Document Released: 04/23/2007 Document Revised: 03/04/2012 Document Reviewed: 06/24/2007 [facility] Patient Information 2014 [place], [place].   ________________________________________________________________________

## 2014-06-15 ENCOUNTER — Encounter (HOSPITAL_COMMUNITY): Payer: Self-pay | Admitting: *Deleted

## 2014-06-15 ENCOUNTER — Encounter (HOSPITAL_COMMUNITY)
Admission: RE | Disposition: A | Discharge status: Home Health | Payer: Self-pay | Source: Ambulatory Visit | Attending: Orthopedic Surgery

## 2014-06-15 ENCOUNTER — Inpatient Hospital Stay (HOSPITAL_COMMUNITY)
Admission: RE | Admit: 2014-06-15 | Discharge: 2014-06-16 | DRG: 470 | Disposition: A | Discharge status: Home Health | Payer: Medicare Other | Source: Ambulatory Visit | Attending: Orthopedic Surgery | Admitting: Orthopedic Surgery

## 2014-06-15 ENCOUNTER — Inpatient Hospital Stay (HOSPITAL_COMMUNITY): Payer: Medicare Other | Admitting: Anesthesiology

## 2014-06-15 ENCOUNTER — Encounter (HOSPITAL_COMMUNITY): Payer: Medicare Other | Admitting: Anesthesiology

## 2014-06-15 DIAGNOSIS — M1710 Unilateral primary osteoarthritis, unspecified knee: Principal | ICD-10-CM | POA: Diagnosis present

## 2014-06-15 DIAGNOSIS — E119 Type 2 diabetes mellitus without complications: Secondary | ICD-10-CM | POA: Diagnosis present

## 2014-06-15 DIAGNOSIS — Z8249 Family history of ischemic heart disease and other diseases of the circulatory system: Secondary | ICD-10-CM

## 2014-06-15 DIAGNOSIS — I10 Essential (primary) hypertension: Secondary | ICD-10-CM | POA: Diagnosis present

## 2014-06-15 DIAGNOSIS — M6580 Other synovitis and tenosynovitis, unspecified site: Secondary | ICD-10-CM | POA: Diagnosis present

## 2014-06-15 DIAGNOSIS — F17200 Nicotine dependence, unspecified, uncomplicated: Secondary | ICD-10-CM | POA: Diagnosis present

## 2014-06-15 DIAGNOSIS — Z9849 Cataract extraction status, unspecified eye: Secondary | ICD-10-CM

## 2014-06-15 DIAGNOSIS — E785 Hyperlipidemia, unspecified: Secondary | ICD-10-CM | POA: Diagnosis present

## 2014-06-15 DIAGNOSIS — IMO0002 Reserved for concepts with insufficient information to code with codable children: Secondary | ICD-10-CM

## 2014-06-15 DIAGNOSIS — Z96659 Presence of unspecified artificial knee joint: Secondary | ICD-10-CM

## 2014-06-15 DIAGNOSIS — Z96651 Presence of right artificial knee joint: Secondary | ICD-10-CM

## 2014-06-15 HISTORY — PX: TOTAL KNEE ARTHROPLASTY: SHX125

## 2014-06-15 LAB — GLUCOSE, CAPILLARY
Glucose-Capillary: 119 mg/dL — ABNORMAL HIGH (ref 70–99)
Glucose-Capillary: 128 mg/dL — ABNORMAL HIGH (ref 70–99)
Glucose-Capillary: 180 mg/dL — ABNORMAL HIGH (ref 70–99)
Glucose-Capillary: 230 mg/dL — ABNORMAL HIGH (ref 70–99)
Glucose-Capillary: 243 mg/dL — ABNORMAL HIGH (ref 70–99)
Glucose-Capillary: 168 mg/dL — ABNORMAL HIGH (ref 70–99)

## 2014-06-15 LAB — ABO/RH: ABO/RH(D): O POS

## 2014-06-15 LAB — TYPE AND SCREEN
ABO/RH(D): O POS
Antibody Screen: NEGATIVE

## 2014-06-15 SURGERY — ARTHROPLASTY, KNEE, TOTAL
Anesthesia: Spinal | Site: Knee | Laterality: Right

## 2014-06-15 MED ORDER — METHOCARBAMOL 1000 MG/10ML IJ SOLN
500.0000 mg | Freq: Four times a day (QID) | INTRAVENOUS | Status: DC | PRN
  Filled 2014-06-15: qty 5

## 2014-06-15 MED ORDER — INSULIN ASPART 100 UNIT/ML ~~LOC~~ SOLN: 0.0000 [IU] | Freq: Three times a day (TID) | SUBCUTANEOUS | Status: DC

## 2014-06-15 MED ORDER — HYDROMORPHONE HCL PF 1 MG/ML IJ SOLN
0.5000 mg | INTRAMUSCULAR | Status: DC | PRN
  Administered 2014-06-15 – 2014-06-16 (×2): 1 mg via INTRAVENOUS
  Filled 2014-06-15 (×2): qty 1

## 2014-06-15 MED ORDER — SODIUM CHLORIDE 0.9 % IJ SOLN
INTRAMUSCULAR | Status: AC
  Filled 2014-06-15: qty 10

## 2014-06-15 MED ORDER — OXYCODONE HCL 5 MG/5ML PO SOLN
5.0000 mg | Freq: Once | ORAL | Status: DC | PRN
  Filled 2014-06-15: qty 5

## 2014-06-15 MED ORDER — KETOROLAC TROMETHAMINE 30 MG/ML IJ SOLN
INTRAMUSCULAR | Status: AC
  Filled 2014-06-15: qty 1

## 2014-06-15 MED ORDER — DOCUSATE SODIUM 100 MG PO CAPS
100.0000 mg | ORAL_CAPSULE | Freq: Two times a day (BID) | ORAL | Status: DC
  Administered 2014-06-15: 100 mg via ORAL
  Filled 2014-06-15 (×3): qty 1

## 2014-06-15 MED ORDER — ASPIRIN EC 325 MG PO TBEC
325.0000 mg | DELAYED_RELEASE_TABLET | Freq: Two times a day (BID) | ORAL | Status: DC
  Administered 2014-06-16: 325 mg via ORAL
  Filled 2014-06-15 (×3): qty 1

## 2014-06-15 MED ORDER — LACTATED RINGERS IV SOLN: INTRAVENOUS | Status: DC

## 2014-06-15 MED ORDER — NEBIVOLOL HCL 10 MG PO TABS
10.0000 mg | ORAL_TABLET | Freq: Every morning | ORAL | Status: DC
  Administered 2014-06-16: 10 mg via ORAL
  Filled 2014-06-15: qty 1

## 2014-06-15 MED ORDER — MIDAZOLAM HCL 2 MG/2ML IJ SOLN
INTRAMUSCULAR | Status: AC
  Filled 2014-06-15: qty 2

## 2014-06-15 MED ORDER — BUPIVACAINE-EPINEPHRINE (PF) 0.25% -1:200000 IJ SOLN
INTRAMUSCULAR | Status: AC
  Filled 2014-06-15: qty 30

## 2014-06-15 MED ORDER — BUPIVACAINE-EPINEPHRINE (PF) 0.25% -1:200000 IJ SOLN
INTRAMUSCULAR | Status: DC | PRN
  Administered 2014-06-15: 30 mL

## 2014-06-15 MED ORDER — MIDAZOLAM HCL 5 MG/5ML IJ SOLN
INTRAMUSCULAR | Status: DC | PRN
  Administered 2014-06-15: 2 mg via INTRAVENOUS

## 2014-06-15 MED ORDER — METFORMIN HCL 500 MG PO TABS
500.0000 mg | ORAL_TABLET | Freq: Two times a day (BID) | ORAL | Status: DC
  Administered 2014-06-15 – 2014-06-16 (×2): 500 mg via ORAL
  Filled 2014-06-15 (×4): qty 1

## 2014-06-15 MED ORDER — TRANEXAMIC ACID 100 MG/ML IV SOLN
1000.0000 mg | Freq: Once | INTRAVENOUS | Status: AC
  Administered 2014-06-15: 1000 mg via INTRAVENOUS
  Filled 2014-06-15: qty 10

## 2014-06-15 MED ORDER — PROPOFOL 10 MG/ML IV BOLUS
INTRAVENOUS | Status: AC
  Filled 2014-06-15: qty 20

## 2014-06-15 MED ORDER — ONDANSETRON HCL 4 MG/2ML IJ SOLN
INTRAMUSCULAR | Status: AC
  Filled 2014-06-15: qty 2

## 2014-06-15 MED ORDER — PROPOFOL INFUSION 10 MG/ML OPTIME
INTRAVENOUS | Status: DC | PRN
  Administered 2014-06-15: 140 ug/kg/min via INTRAVENOUS

## 2014-06-15 MED ORDER — SODIUM CHLORIDE 0.9 % IR SOLN
Status: DC | PRN
  Administered 2014-06-15: 1000 mL

## 2014-06-15 MED ORDER — CEFAZOLIN SODIUM-DEXTROSE 2-3 GM-% IV SOLR
2.0000 g | INTRAVENOUS | Status: AC
  Administered 2014-06-15: 2 g via INTRAVENOUS

## 2014-06-15 MED ORDER — ONDANSETRON HCL 4 MG PO TABS: 4.0000 mg | ORAL_TABLET | Freq: Four times a day (QID) | ORAL | Status: DC | PRN

## 2014-06-15 MED ORDER — BISACODYL 10 MG RE SUPP: 10.0000 mg | Freq: Every day | RECTAL | Status: DC | PRN

## 2014-06-15 MED ORDER — DEXAMETHASONE SODIUM PHOSPHATE 10 MG/ML IJ SOLN
INTRAMUSCULAR | Status: DC | PRN
  Administered 2014-06-15: 10 mg via INTRAVENOUS

## 2014-06-15 MED ORDER — LOSARTAN POTASSIUM 50 MG PO TABS
100.0000 mg | ORAL_TABLET | Freq: Every morning | ORAL | Status: DC
  Administered 2014-06-15 – 2014-06-16 (×2): 100 mg via ORAL
  Filled 2014-06-15 (×2): qty 2

## 2014-06-15 MED ORDER — TERAZOSIN HCL 2 MG PO CAPS
2.0000 mg | ORAL_CAPSULE | Freq: Every day | ORAL | Status: DC
  Administered 2014-06-16: 2 mg via ORAL
  Filled 2014-06-15: qty 1

## 2014-06-15 MED ORDER — POLYETHYLENE GLYCOL 3350 17 G PO PACK
17.0000 g | PACK | Freq: Two times a day (BID) | ORAL | Status: DC
  Filled 2014-06-15 (×3): qty 1

## 2014-06-15 MED ORDER — PROMETHAZINE HCL 25 MG/ML IJ SOLN: 6.2500 mg | INTRAMUSCULAR | Status: DC | PRN

## 2014-06-15 MED ORDER — EPHEDRINE SULFATE 50 MG/ML IJ SOLN
INTRAMUSCULAR | Status: DC | PRN
  Administered 2014-06-15 (×3): 5 mg via INTRAVENOUS

## 2014-06-15 MED ORDER — METHOCARBAMOL 500 MG PO TABS
500.0000 mg | ORAL_TABLET | Freq: Four times a day (QID) | ORAL | Status: DC | PRN
  Administered 2014-06-15 – 2014-06-16 (×4): 500 mg via ORAL
  Filled 2014-06-15 (×3): qty 1

## 2014-06-15 MED ORDER — ONDANSETRON HCL 4 MG/2ML IJ SOLN: 4.0000 mg | Freq: Four times a day (QID) | INTRAMUSCULAR | Status: DC | PRN

## 2014-06-15 MED ORDER — CELECOXIB 200 MG PO CAPS
200.0000 mg | ORAL_CAPSULE | Freq: Two times a day (BID) | ORAL | Status: DC
  Administered 2014-06-15: 200 mg via ORAL
  Filled 2014-06-15 (×4): qty 1

## 2014-06-15 MED ORDER — EPHEDRINE SULFATE 50 MG/ML IJ SOLN
INTRAMUSCULAR | Status: AC
Start: 2014-06-15 — End: 2014-06-15
  Filled 2014-06-15: qty 1

## 2014-06-15 MED ORDER — SODIUM CHLORIDE 0.9 % IJ SOLN
INTRAMUSCULAR | Status: DC | PRN
  Administered 2014-06-15: 9 mL via INTRAVENOUS

## 2014-06-15 MED ORDER — HYDROCODONE-ACETAMINOPHEN 7.5-325 MG PO TABS
1.0000 | ORAL_TABLET | ORAL | Status: DC
  Administered 2014-06-15 (×3): 1 via ORAL
  Administered 2014-06-16: 2 via ORAL
  Administered 2014-06-16 (×2): 1 via ORAL
  Filled 2014-06-15: qty 1
  Filled 2014-06-15: qty 2
  Filled 2014-06-15 (×4): qty 1

## 2014-06-15 MED ORDER — KETOROLAC TROMETHAMINE 30 MG/ML IJ SOLN
INTRAMUSCULAR | Status: DC | PRN
  Administered 2014-06-15: 30 mg via INTRAVENOUS

## 2014-06-15 MED ORDER — SODIUM CHLORIDE 0.9 % IV SOLN
INTRAVENOUS | Status: DC
  Administered 2014-06-15: 15:00:00 via INTRAVENOUS
  Filled 2014-06-15 (×3): qty 1000

## 2014-06-15 MED ORDER — FENTANYL CITRATE 0.05 MG/ML IJ SOLN
INTRAMUSCULAR | Status: DC | PRN
  Administered 2014-06-15 (×2): 50 ug via INTRAVENOUS

## 2014-06-15 MED ORDER — ONDANSETRON HCL 4 MG/2ML IJ SOLN
INTRAMUSCULAR | Status: DC | PRN
  Administered 2014-06-15: 4 mg via INTRAVENOUS

## 2014-06-15 MED ORDER — CEFAZOLIN SODIUM-DEXTROSE 2-3 GM-% IV SOLR
2.0000 g | Freq: Four times a day (QID) | INTRAVENOUS | Status: AC
  Administered 2014-06-15 (×2): 2 g via INTRAVENOUS
  Filled 2014-06-15 (×2): qty 50

## 2014-06-15 MED ORDER — DEXAMETHASONE SODIUM PHOSPHATE 10 MG/ML IJ SOLN
INTRAMUSCULAR | Status: AC
  Filled 2014-06-15: qty 1

## 2014-06-15 MED ORDER — METOCLOPRAMIDE HCL 10 MG PO TABS: 5.0000 mg | ORAL_TABLET | Freq: Three times a day (TID) | ORAL | Status: DC | PRN

## 2014-06-15 MED ORDER — PHENOL 1.4 % MT LIQD: 1.0000 | OROMUCOSAL | Status: DC | PRN

## 2014-06-15 MED ORDER — METOCLOPRAMIDE HCL 5 MG/ML IJ SOLN: 5.0000 mg | Freq: Three times a day (TID) | INTRAMUSCULAR | Status: DC | PRN

## 2014-06-15 MED ORDER — ALUM & MAG HYDROXIDE-SIMETH 200-200-20 MG/5ML PO SUSP: 30.0000 mL | ORAL | Status: DC | PRN

## 2014-06-15 MED ORDER — OXYCODONE HCL 5 MG PO TABS: 5.0000 mg | ORAL_TABLET | Freq: Once | ORAL | Status: DC | PRN

## 2014-06-15 MED ORDER — HYDROMORPHONE HCL PF 1 MG/ML IJ SOLN
0.2500 mg | INTRAMUSCULAR | Status: DC | PRN
Start: 2014-06-15 — End: 2014-06-15

## 2014-06-15 MED ORDER — FENTANYL CITRATE 0.05 MG/ML IJ SOLN
INTRAMUSCULAR | Status: AC
  Filled 2014-06-15: qty 2

## 2014-06-15 MED ORDER — 0.9 % SODIUM CHLORIDE (POUR BTL) OPTIME
TOPICAL | Status: DC | PRN
  Administered 2014-06-15: 1000 mL

## 2014-06-15 MED ORDER — BUPIVACAINE LIPOSOME 1.3 % IJ SUSP
20.0000 mL | Freq: Once | INTRAMUSCULAR | Status: AC
  Administered 2014-06-15: 20 mL
  Filled 2014-06-15: qty 20

## 2014-06-15 MED ORDER — DIPHENHYDRAMINE HCL 25 MG PO CAPS: 25.0000 mg | ORAL_CAPSULE | Freq: Four times a day (QID) | ORAL | Status: DC | PRN

## 2014-06-15 MED ORDER — MEPERIDINE HCL 50 MG/ML IJ SOLN
6.2500 mg | INTRAMUSCULAR | Status: DC | PRN
Start: 2014-06-15 — End: 2014-06-15

## 2014-06-15 MED ORDER — LACTATED RINGERS IV SOLN
INTRAVENOUS | Status: DC | PRN
  Administered 2014-06-15 (×3): via INTRAVENOUS

## 2014-06-15 MED ORDER — MENTHOL 3 MG MT LOZG
1.0000 | LOZENGE | OROMUCOSAL | Status: DC | PRN
  Filled 2014-06-15: qty 9

## 2014-06-15 MED ORDER — FERROUS SULFATE 325 (65 FE) MG PO TABS
325.0000 mg | ORAL_TABLET | Freq: Three times a day (TID) | ORAL | Status: DC
  Filled 2014-06-15 (×5): qty 1

## 2014-06-15 MED ORDER — CEFAZOLIN SODIUM-DEXTROSE 2-3 GM-% IV SOLR
INTRAVENOUS | Status: AC
  Filled 2014-06-15: qty 50

## 2014-06-15 MED ORDER — MAGNESIUM CITRATE PO SOLN: 1.0000 | Freq: Once | ORAL | Status: AC | PRN

## 2014-06-15 MED ORDER — FENOFIBRATE 160 MG PO TABS
160.0000 mg | ORAL_TABLET | Freq: Every day | ORAL | Status: DC
  Administered 2014-06-15 – 2014-06-16 (×2): 160 mg via ORAL
  Filled 2014-06-15 (×2): qty 1

## 2014-06-15 SURGICAL SUPPLY — 53 items
BAG ZIPLOCK 12X15 (MISCELLANEOUS) IMPLANT
BANDAGE ELASTIC 6 VELCRO ST LF (GAUZE/BANDAGES/DRESSINGS) ×2 IMPLANT
BANDAGE ESMARK 6X9 LF (GAUZE/BANDAGES/DRESSINGS) ×1 IMPLANT
BLADE SAW SGTL 13.0X1.19X90.0M (BLADE) ×2 IMPLANT
BNDG ESMARK 6X9 LF (GAUZE/BANDAGES/DRESSINGS) ×2
BOWL SMART MIX CTS (DISPOSABLE) ×2 IMPLANT
CAPT RP KNEE ×2 IMPLANT
CEMENT HV SMART SET (Cement) ×4 IMPLANT
CUFF TOURN SGL QUICK 34 (TOURNIQUET CUFF) ×1
CUFF TRNQT CYL 34X4X40X1 (TOURNIQUET CUFF) ×1 IMPLANT
DERMABOND ADVANCED (GAUZE/BANDAGES/DRESSINGS) ×1
DERMABOND ADVANCED .7 DNX12 (GAUZE/BANDAGES/DRESSINGS) ×1 IMPLANT
DRAPE EXTREMITY T 121X128X90 (DRAPE) ×2 IMPLANT
DRAPE POUCH INSTRU U-SHP 10X18 (DRAPES) ×2 IMPLANT
DRAPE U-SHAPE 47X51 STRL (DRAPES) ×2 IMPLANT
DRSG AQUACEL AG ADV 3.5X10 (GAUZE/BANDAGES/DRESSINGS) ×2 IMPLANT
DRSG TEGADERM 4X4.75 (GAUZE/BANDAGES/DRESSINGS) IMPLANT
DURAPREP 26ML APPLICATOR (WOUND CARE) ×4 IMPLANT
ELECT REM PT RETURN 9FT ADLT (ELECTROSURGICAL) ×2
ELECTRODE REM PT RTRN 9FT ADLT (ELECTROSURGICAL) ×1 IMPLANT
FACESHIELD WRAPAROUND (MASK) ×10 IMPLANT
GAUZE SPONGE 2X2 8PLY STRL LF (GAUZE/BANDAGES/DRESSINGS) IMPLANT
GLOVE BIOGEL PI IND STRL 7.5 (GLOVE) ×1 IMPLANT
GLOVE BIOGEL PI IND STRL 8 (GLOVE) ×1 IMPLANT
GLOVE BIOGEL PI INDICATOR 7.5 (GLOVE) ×1
GLOVE BIOGEL PI INDICATOR 8 (GLOVE) ×1
GLOVE ECLIPSE 8.0 STRL XLNG CF (GLOVE) ×2 IMPLANT
GLOVE ORTHO TXT STRL SZ7.5 (GLOVE) ×4 IMPLANT
GOWN SPEC L3 XXLG W/TWL (GOWN DISPOSABLE) ×2 IMPLANT
GOWN STRL REUS W/TWL LRG LVL3 (GOWN DISPOSABLE) ×2 IMPLANT
HANDPIECE INTERPULSE COAX TIP (DISPOSABLE) ×1
KIT BASIN OR (CUSTOM PROCEDURE TRAY) ×2 IMPLANT
MANIFOLD NEPTUNE II (INSTRUMENTS) ×2 IMPLANT
NDL SAFETY ECLIPSE 18X1.5 (NEEDLE) ×1 IMPLANT
NEEDLE HYPO 18GX1.5 SHARP (NEEDLE) ×1
PACK TOTAL JOINT (CUSTOM PROCEDURE TRAY) ×2 IMPLANT
POSITIONER SURGICAL ARM (MISCELLANEOUS) ×2 IMPLANT
SET HNDPC FAN SPRY TIP SCT (DISPOSABLE) ×1 IMPLANT
SET PAD KNEE POSITIONER (MISCELLANEOUS) ×2 IMPLANT
SPONGE GAUZE 2X2 STER 10/PKG (GAUZE/BANDAGES/DRESSINGS)
SUCTION FRAZIER 12FR DISP (SUCTIONS) ×2 IMPLANT
SUT MNCRL AB 4-0 PS2 18 (SUTURE) ×2 IMPLANT
SUT VIC AB 1 CT1 36 (SUTURE) ×2 IMPLANT
SUT VIC AB 2-0 CT1 27 (SUTURE) ×3
SUT VIC AB 2-0 CT1 TAPERPNT 27 (SUTURE) ×3 IMPLANT
SUT VLOC 180 0 24IN GS25 (SUTURE) ×2 IMPLANT
SYRINGE 60CC LL (MISCELLANEOUS) ×2 IMPLANT
TOWEL OR 17X26 10 PK STRL BLUE (TOWEL DISPOSABLE) ×2 IMPLANT
TOWEL OR NON WOVEN STRL DISP B (DISPOSABLE) IMPLANT
TRAY FOLEY CATH 14FRSI W/METER (CATHETERS) IMPLANT
TRAY FOLEY METER SIL LF 16FR (CATHETERS) ×2 IMPLANT
WATER STERILE IRR 1500ML POUR (IV SOLUTION) ×2 IMPLANT
WRAP KNEE MAXI GEL POST OP (GAUZE/BANDAGES/DRESSINGS) ×2 IMPLANT

## 2014-06-15 NOTE — Anesthesia Procedure Notes (Signed)
Spinal  Patient location during procedure: OR Staffing Anesthesiologist: Nolon Nations R Performed by: anesthesiologist  Preanesthetic Checklist Completed: patient identified, site marked, surgical consent, pre-op evaluation, timeout performed, IV checked, risks and benefits discussed and monitors and equipment checked Spinal Block Patient position: sitting Prep: Betadine Patient monitoring: heart rate, continuous pulse ox and blood pressure Approach: right paramedian Location: L2-3 Injection technique: single-shot Needle Needle type: Sprotte  Needle gauge: 24 G Needle length: 9 cm Assessment Sensory level: T8 Additional Notes Expiration date of kit checked and confirmed. Patient tolerated procedure well, without complications.

## 2014-06-15 NOTE — Progress Notes (Signed)
Dr. Lissa Hoard made aware of patient's blood pressures and Aldrete scores.

## 2014-06-15 NOTE — Anesthesia Postprocedure Evaluation (Signed)
Anesthesia Post Note  Patient: Louis Kemp  Procedure(s) Performed: Procedure(s) (LRB): RIGHT TOTAL KNEE ARTHROPLASTY (Right)  Anesthesia type: Spinal  Patient location: PACU  Post pain: Pain level controlled  Post assessment: Post-op Vital signs reviewed  Last Vitals: BP 144/80  Pulse 66  Temp(Src) 36.3 C (Oral)  Resp 17  SpO2 100%  Post vital signs: Reviewed  Level of consciousness: sedated  Complications: No apparent anesthesia complications

## 2014-06-15 NOTE — Anesthesia Preprocedure Evaluation (Signed)
Anesthesia Evaluation  Patient identified by MRN, date of birth, ID band Patient awake    Reviewed: Allergy & Precautions, H&P , NPO status , Patient's Chart, lab work & pertinent test results  Airway Mallampati: II TM Distance: >3 FB Neck ROM: Full    Dental  (+) Dental Advisory Given   Pulmonary Current Smoker,  breath sounds clear to auscultation        Cardiovascular hypertension, Pt. on medications + Valvular Problems/Murmurs Rhythm:Regular Rate:Normal     Neuro/Psych negative neurological ROS  negative psych ROS   GI/Hepatic negative GI ROS, Neg liver ROS,   Endo/Other  negative endocrine ROSdiabetes  Renal/GU negative Renal ROS     Musculoskeletal negative musculoskeletal ROS (+)   Abdominal   Peds  Hematology negative hematology ROS (+)   Anesthesia Other Findings   Reproductive/Obstetrics negative OB ROS                           Anesthesia Physical Anesthesia Plan  ASA: III  Anesthesia Plan: Spinal   Post-op Pain Management:    Induction: Intravenous  Airway Management Planned:   Additional Equipment:   Intra-op Plan:   Post-operative Plan:   Informed Consent: I have reviewed the patients History and Physical, chart, labs and discussed the procedure including the risks, benefits and alternatives for the proposed anesthesia with the patient or authorized representative who has indicated his/her understanding and acceptance.   Dental advisory given  Plan Discussed with: CRNA  Anesthesia Plan Comments:         Anesthesia Quick Evaluation

## 2014-06-15 NOTE — Progress Notes (Signed)
Utilization review completed.  

## 2014-06-15 NOTE — Evaluation (Signed)
Physical Therapy Evaluation Patient Details Name: Louis Kemp MRN: [id number] DOB: 10-25-1941 Today's Date: 06/15/2014   History of Present Illness  RTKA  Clinical Impression  Pt tolerated ambulation well, increased pain R knee. Able to perform SLR. Pt will benefit from PT while in acute care to address problems listed in chart below. Plans Dc tomorrow.    Follow Up Recommendations Home health PT    Equipment Recommendations  None recommended by PT    Recommendations for Other Services       Precautions / Restrictions Precautions Precautions: Fall;Knee Restrictions Weight Bearing Restrictions: No      Mobility  Bed Mobility Overal bed mobility: Needs Assistance Bed Mobility: Supine to Sit     Supine to sit: Min guard     General bed mobility comments: support R leg to edge and foot to floor.  Transfers Overall transfer level: Needs assistance Equipment used: Rolling walker (2 wheeled) Transfers: Sit to/from Stand Sit to Stand: Min assist         General transfer comment: cues for hand and R leg position during transition  Ambulation/Gait Ambulation/Gait assistance: Min assist Ambulation Distance (Feet): 125 Feet Assistive device: Rolling walker (2 wheeled) Gait Pattern/deviations: Step-to pattern;Step-through pattern;Antalgic;Decreased stance time - right     General Gait Details: cues for sequence, position inside of RW , appeared L leg may have been mildly discontrolled. but did not appear to affect ambulation  Stairs            Wheelchair Mobility    Modified Rankin (Stroke Patients Only)       Balance                                             Pertinent Vitals/Pain 4- 6 pain R knee, ice applied, to get medication    Home Living Family/patient expects to be discharged to:: Private residence Living Arrangements: Spouse/significant other Available Help at Discharge: Family Type of Home: House Home Access: Stairs  to enter Entrance Stairs-Rails: Right;Left;Can reach both Technical brewer of Steps: 5 Home Layout: One level Home Equipment: Environmental consultant - 2 wheels      Prior Function Level of Independence: Independent               Hand Dominance        Extremity/Trunk Assessment   Upper Extremity Assessment: Overall WFL for tasks assessed           Lower Extremity Assessment: RLE deficits/detail RLE Deficits / Details: performs SLR and flexes to 60*       Communication   Communication: No difficulties  Cognition Arousal/Alertness: Awake/alert Behavior During Therapy: WFL for tasks assessed/performed Overall Cognitive Status: Within Functional Limits for tasks assessed                      General Comments      Exercises        Assessment/Plan    PT Assessment Patient needs continued PT services  PT Diagnosis Difficulty walking;Acute pain   PT Problem List Decreased strength;Decreased range of motion;Decreased activity tolerance;Decreased knowledge of precautions;Decreased safety awareness;Decreased knowledge of use of DME;Pain  PT Treatment Interventions DME instruction;Gait training;Stair training;Functional mobility training;Therapeutic activities;Therapeutic exercise;Patient/family education   PT Goals (Current goals can be found in the Care Plan section) Acute Rehab PT Goals Patient Stated Goal: to go homw e tomorrow PT  Goal Formulation: With patient Time For Goal Achievement: 06/17/14 Potential to Achieve Goals: Good    Frequency 7X/week   Barriers to discharge        Co-evaluation               End of Session Equipment Utilized During Treatment: Gait belt Activity Tolerance: Patient tolerated treatment well Patient left: in chair;with call bell/phone within reach;with family/visitor present Nurse Communication: Mobility status;Patient requests pain meds         Time: 1633-1700 PT Time Calculation (min): 27 min   Charges:   PT  Evaluation $Initial PT Evaluation Tier I: 1 Procedure PT Treatments $Gait Training: 23-37 mins   PT G Codes:          [name] 06/15/2014, 5:31 PM Tresa Endo PT [phone]

## 2014-06-15 NOTE — Op Note (Signed)
NAME:  Louis Kemp                      MEDICAL RECORD NO.:  [id number]                             FACILITY:  [facility]      PHYSICIAN:  Pietro Cassis. Alvan Dame, M.D.  DATE OF BIRTH:  16-Apr-1941      DATE OF PROCEDURE:  06/15/2014                                     OPERATIVE REPORT         PREOPERATIVE DIAGNOSIS:  Right knee osteoarthritis.      POSTOPERATIVE DIAGNOSIS:  Right knee osteoarthritis.      FINDINGS:  The patient was noted to have complete loss of cartilage and   bone-on-bone arthritis with associated osteophytes in the medial and patellofemoral compartments of   the knee with a significant synovitis and associated effusion.      PROCEDURE:  Right total knee replacement.      COMPONENTS USED:  DePuy rotating platform posterior stabilized knee   system, a size 4N femur, 4 tibia, 12.5 mm PS insert, and 41 patellar   button.      SURGEON:  Pietro Cassis. Alvan Dame, M.D.      ASSISTANT:  Danae Orleans, PA-C.      ANESTHESIA:  Spinal.      SPECIMENS:  None.      COMPLICATION:  None.      DRAINS:  None  EBL: <50cc      TOURNIQUET TIME:   Total Tourniquet Time Documented: Thigh (Right) - 34 minutes Total: Thigh (Right) - 34 minutes  .      The patient was stable to the recovery room.      INDICATION FOR PROCEDURE:  Louis Kemp is a 72 y.o. male patient of   mine.  The patient had been seen, evaluated, and treated conservatively in the   office with medication, activity modification, and injections.  The patient had   radiographic changes of bone-on-bone arthritis with endplate sclerosis and osteophytes noted.      The patient failed conservative measures including medication, injections, and activity modification, and at this point was ready for more definitive measures.   Based on the radiographic changes and failed conservative measures, the patient   decided to proceed with total knee replacement.  Risks of infection,   DVT, component failure, need for revision surgery,  postop course, and   expectations were all   discussed and reviewed.  Consent was obtained for benefit of pain   relief.      PROCEDURE IN DETAIL:  The patient was brought to the operative theater.   Once adequate anesthesia, preoperative antibiotics, 2 gm of Ancef administered, the patient was positioned supine with the right thigh tourniquet placed.  The  right lower extremity was prepped and draped in sterile fashion.  A time-   out was performed identifying the patient, planned procedure, and   extremity.      The right lower extremity was placed in the [facility] leg holder.  The leg was   exsanguinated, tourniquet elevated to 250 mmHg.  A midline incision was   made followed by median parapatellar arthrotomy.  Following initial   exposure, attention was first directed to  the patella.  Precut   measurement was noted to be 25-26 mm.  I resected down to 14 mm and used a   41 patellar button to restore patellar height as well as cover the cut   surface.      The lug holes were drilled and a metal shim was placed to protect the   patella from retractors and saw blades.      At this point, attention was now directed to the femur.  The femoral   canal was opened with a drill, irrigated to try to prevent fat emboli.  An   intramedullary rod was passed at 3 degrees valgus, 10 mm of bone was   resected off the distal femur.  Following this resection, the tibia was   subluxated anteriorly.  Using the extramedullary guide, 10 mm of bone was resected off   the proximal lateral tibia.  We confirmed the gap would be   stable medially and laterally with a 10 mm insert as well as confirmed   the cut was perpendicular in the coronal plane, checking with an alignment rod.      Once this was done, I sized the femur to be a size 4 in the anterior-   posterior dimension, chose a narrow component based on medial and   lateral dimension.  The size 4 rotation block was then pinned in   position anterior  referenced using the C-clamp to set rotation.  The   anterior, posterior, and  chamfer cuts were made without difficulty nor   notching making certain that I was along the anterior cortex to help   with flexion gap stability.      The final box cut was made off the lateral aspect of distal femur.      At this point, the tibia was sized to be a size 4, the size 4 tray was   then pinned in position through the medial third of the tubercle,   drilled, and keel punched.  Trial reduction was now carried with a 4N femur,  4 tibia, a 12.5 mm insert, and the 41 patella botton.  The knee was brought to   extension, full extension with good flexion stability with the patella   tracking through the trochlea without application of pressure.  Given   all these findings, the trial components removed.  Final components were   opened and cement was mixed.  The knee was irrigated with normal saline   solution and pulse lavage.  The synovial lining was   then injected with 20cc of Exparel, 30cc of 0.25% [place]aine with epinephrine and 1 cc of Toradol,   total of 61 cc.      The knee was irrigated.  Final implants were then cemented onto clean and   dried cut surfaces of bone with the knee brought to extension with a 12.5 mm trial insert.      Once the cement had fully cured, the excess cement was removed   throughout the knee.  I confirmed I was satisfied with the range of   motion and stability, and the final 12.5 mm PS insert was chosen.  It was   placed into the knee.      The tourniquet had been let down at 34 minutes.  No significant   hemostasis required.  The   extensor mechanism was then reapproximated using #1 Vicryl with the knee   in flexion.  The   remaining wound was closed with  2-0 Vicryl and running 4-0 Monocryl.   The knee was cleaned, dried, dressed sterilely using Dermabond and   Aquacel dressing  The patient was then   brought to recovery room in stable condition, tolerating the  procedure   well.   Please note that Physician Assistant, Danae Orleans, was present for the entirety of the case, and was utilized for pre-operative positioning, peri-operative retractor management, general facilitation of the procedure.  He was also utilized for primary wound closure at the end of the case.              Pietro Cassis Alvan Dame, M.D.    06/15/2014 11:28 AM

## 2014-06-15 NOTE — Interval H&P Note (Signed)
History and Physical Interval Note:  06/15/2014 8:44 AM  Louis Kemp  has presented today for surgery, with the diagnosis of RIGHT KNEE OA  The various methods of treatment have been discussed with the patient and family. After consideration of risks, benefits and other options for treatment, the patient has consented to  Procedure(s): RIGHT TOTAL KNEE ARTHROPLASTY (Right) as a surgical intervention .  The patient's history has been reviewed, patient examined, no change in status, stable for surgery.  I have reviewed the patient's chart and labs.  Questions were answered to the patient's satisfaction.     Mauri Pole

## 2014-06-15 NOTE — Transfer of Care (Signed)
Immediate Anesthesia Transfer of Care Note  Patient: Louis Kemp  Procedure(s) Performed: Procedure(s): RIGHT TOTAL KNEE ARTHROPLASTY (Right)  Patient Location: PACU  Anesthesia Type:Spinal  Level of Consciousness: awake, alert , oriented and patient cooperative  Airway & Oxygen Therapy: Patient Spontanous Breathing and Patient connected to face mask oxygen  Post-op Assessment: Report given to PACU RN and Post -op Vital signs reviewed and stable  Post vital signs: Reviewed and stable  Complications: No apparent anesthesia complications

## 2014-06-16 LAB — CBC
HCT: 34.6 % — ABNORMAL LOW (ref 39.0–52.0)
Hemoglobin: 12 g/dL — ABNORMAL LOW (ref 13.0–17.0)
MCH: 30.3 pg (ref 26.0–34.0)
MCHC: 34.7 g/dL (ref 30.0–36.0)
MCV: 87.4 fL (ref 78.0–100.0)
Platelets: 260 10*3/uL (ref 150–400)
RBC: 3.96 MIL/uL — ABNORMAL LOW (ref 4.22–5.81)
RDW: 12.5 % (ref 11.5–15.5)
WBC: 15.1 10*3/uL — ABNORMAL HIGH (ref 4.0–10.5)

## 2014-06-16 LAB — BASIC METABOLIC PANEL
BUN: 21 mg/dL (ref 6–23)
CO2: 25 mEq/L (ref 19–32)
Calcium: 8.5 mg/dL (ref 8.4–10.5)
Chloride: 103 mEq/L (ref 96–112)
Creatinine, Ser: 1.57 mg/dL — ABNORMAL HIGH (ref 0.50–1.35)
GFR calc Af Amer: 49 mL/min — ABNORMAL LOW (ref >90)
GFR calc non Af Amer: 42 mL/min — ABNORMAL LOW (ref >90)
Glucose, Bld: 147 mg/dL — ABNORMAL HIGH (ref 70–99)
Potassium: 4.7 mEq/L (ref 3.7–5.3)
Sodium: 137 mEq/L (ref 137–147)

## 2014-06-16 LAB — GLUCOSE, CAPILLARY: Glucose-Capillary: 137 mg/dL — ABNORMAL HIGH (ref 70–99)

## 2014-06-16 MED ORDER — ASPIRIN 325 MG PO TBEC: 325.0000 mg | DELAYED_RELEASE_TABLET | Freq: Two times a day (BID) | ORAL | Status: DC

## 2014-06-16 MED ORDER — TIZANIDINE HCL 4 MG PO TABS: 4.0000 mg | ORAL_TABLET | Freq: Four times a day (QID) | ORAL | Status: DC | PRN

## 2014-06-16 MED ORDER — FERROUS SULFATE 325 (65 FE) MG PO TABS: 325.0000 mg | ORAL_TABLET | Freq: Three times a day (TID) | ORAL | Status: DC

## 2014-06-16 MED ORDER — DSS 100 MG PO CAPS: 100.0000 mg | ORAL_CAPSULE | Freq: Two times a day (BID) | ORAL | Status: DC

## 2014-06-16 MED ORDER — POLYETHYLENE GLYCOL 3350 17 G PO PACK: 17.0000 g | PACK | Freq: Two times a day (BID) | ORAL | Status: DC

## 2014-06-16 MED ORDER — HYDROCODONE-ACETAMINOPHEN 7.5-325 MG PO TABS: 1.0000 | ORAL_TABLET | ORAL | Status: DC

## 2014-06-16 NOTE — Progress Notes (Signed)
Physical Therapy Treatment Patient Details Name: Louis Kemp MRN: [id number] DOB: Feb 26, 1941 Today's Date: 06/16/2014    History of Present Illness RTKA    PT Comments    Pt with  Increased pain after session. Will practice steps after meds given.   Follow Up Recommendations  Home health PT     Equipment Recommendations  None recommended by PT    Recommendations for Other Services       Precautions / Restrictions Precautions Precautions: Fall;Knee    Mobility  Bed Mobility Overal bed mobility: Modified Independent                Transfers Overall transfer level: Needs assistance Equipment used: Rolling walker (2 wheeled) Transfers: Sit to/from Stand Sit to Stand: Supervision         General transfer comment: cues for hand and R leg position during transition  Ambulation/Gait Ambulation/Gait assistance: Min guard Ambulation Distance (Feet): 200 Feet   Gait Pattern/deviations: Step-through pattern;Step-to pattern     General Gait Details: cues for sequence, position inside of RW , appeared L leg may have been mildly discontrolled. but did not appear to affect ambulation   Stairs            Wheelchair Mobility    Modified Rankin (Stroke Patients Only)       Balance                                    Cognition Arousal/Alertness: Awake/alert                          Exercises Total Joint Exercises Quad Sets: AROM;Right;10 reps;Supine Short Arc Quad: AROM;Right;10 reps;Supine Heel Slides: AROM;Right;Supine Hip ABduction/ADduction: AROM;Right;10 reps;Supine Straight Leg Raises: AROM;Right;10 reps;Supine Goniometric ROM: 10-70*    General Comments        Pertinent Vitals/Pain 6, RN in to give meds IV for R knee    Home Living                      Prior Function            PT Goals (current goals can now be found in the care plan section) Progress towards PT goals: Progressing toward  goals    Frequency  7X/week    PT Plan Current plan remains appropriate    Co-evaluation             End of Session   Activity Tolerance: Patient tolerated treatment well Patient left: in bed;with call bell/phone within reach;with family/visitor present     Time: 2694-8546 PT Time Calculation (min): 32 min  Charges:  $Gait Training: 8-22 mins $Therapeutic Exercise: 8-22 mins                    G Codes:      Louis Kemp 06/16/2014, 12:20 PM

## 2014-06-16 NOTE — Progress Notes (Signed)
[facility] is providing the following services: patient declined rw and commode - has both at home.   If patient discharges after hours, please call [phone].   Linward Headland 06/16/2014, 10:23 AM

## 2014-06-16 NOTE — Progress Notes (Signed)
OT Cancellation Note  Patient Details Name: Louis Kemp MRN: [id number] DOB: 22-Jan-1941   Cancelled Treatment:    Reason Eval/Treat Not Completed: OT screened, no needs identified, will sign off. Spoke with pt and wife and they feel comfortable with all ADL and have DME. They decline the need for acute OT.  Jules Schick [phone] 06/16/2014, 12:25 PM

## 2014-06-16 NOTE — Progress Notes (Signed)
Patient ID: Louis Kemp, male   DOB: Mar 15, 1941, 73 y.o.   MRN: [id number] Subjective: 1 Day Post-Op Procedure(s) (LRB): RIGHT TOTAL KNEE ARTHROPLASTY (Right)    Patient reports pain as mild to moderate.  Tolerating post-op course very well thus far  Objective:   VITALS:   Filed Vitals:   06/16/14 0552  BP: 135/65  Pulse: 51  Temp: 97.7 F (36.5 C)  Resp: 16    Neurovascular intact Dressing c/d/i  LABS  Recent Labs  06/16/14 0433  HGB 12.0*  HCT 34.6*  WBC 15.1*  PLT 260     Recent Labs  06/16/14 0433  NA 137  K 4.7  BUN 21  CREATININE 1.57*  GLUCOSE 147*    No results found for this basename: LABPT, INR,  in the last 72 hours   Assessment/Plan: 1 Day Post-Op Procedure(s) (LRB): RIGHT TOTAL KNEE ARTHROPLASTY (Right)   Advance diet Up with therapy D/C IV fluids Discharge home with home health today after therapy

## 2014-06-16 NOTE — Progress Notes (Signed)
Pt was stable at time of discharge. IV removed. Reviewed discharge education with patient and family. I answered their questions and they verbalized understanding. Patient left with prescriptions.

## 2014-06-16 NOTE — Progress Notes (Signed)
Physical Therapy Treatment Patient Details Name: Louis Kemp MRN: [id number] DOB: 1941/12/06 Today's Date: 06/16/2014    History of Present Illness rka    PT Comments    Ready for DC after steps practiced  Follow Up Recommendations  Home health PT     Equipment Recommendations  None recommended by PT    Recommendations for Other Services       Precautions / Restrictions Precautions Precautions: Fall;Knee    Mobility  Bed Mobility Overal bed mobility: Modified Independent                Transfers Overall transfer level: Needs assistance Equipment used: Rolling walker (2 wheeled) Transfers: Sit to/from Stand Sit to Stand: Supervision         General transfer comment: cues for hand and R leg position during transition  Ambulation/Gait Ambulation/Gait assistance: Min guard Ambulation Distance (Feet): 150 Feet   Gait Pattern/deviations: Step-through pattern;Step-to pattern     General Gait Details: cues for sequence, position inside of RW , appeared L leg may have been mildly discontrolled. but did not appear to affect ambulation   Stairs Stairs: Yes Stairs assistance: Min guard Stair Management: Two rails;Step to pattern;Forwards Number of Stairs: 5 General stair comments: cues fior safety.  Wheelchair Mobility    Modified Rankin (Stroke Patients Only)       Balance                                    Cognition Arousal/Alertness: Awake/alert                          Exercises Total Joint Exercises Quad Sets: AROM;Right;10 reps;Supine Short Arc Quad: AROM;Right;10 reps;Supine Heel Slides: AROM;Right;Supine Hip ABduction/ADduction: AROM;Right;10 reps;Supine Straight Leg Raises: AROM;Right;10 reps;Supine Goniometric ROM: 10-70*    General Comments        Pertinent Vitals/Pain 2-3 R knee    Home Living                      Prior Function            PT Goals (current goals can now be found in  the care plan section) Progress towards PT goals: Progressing toward goals    Frequency  7X/week    PT Plan Current plan remains appropriate    Co-evaluation             End of Session   Activity Tolerance: Patient tolerated treatment well Patient left: in bed;with call bell/phone within reach;with family/visitor present     Time: 1119-1130 PT Time Calculation (min): 11 min  Charges:  $Gait Training: 8-22 mins $Therapeutic Exercise: 8-22 mins                    G Codes:      [name] 06/16/2014, 12:24 PM

## 2014-06-17 NOTE — Discharge Summary (Signed)
Physician Discharge Summary  Patient ID: Louis Kemp MRN: [id number] DOB/AGE: 31-Jan-1941 73 y.o.  Admit date: 06/15/2014 Discharge date: 06/16/2014   Procedures:  Procedure(s) (LRB): RIGHT TOTAL KNEE ARTHROPLASTY (Right)  Attending Physician:  Dr. Paralee Cancel   Admission Diagnoses:   Right knee OA / pain  Discharge Diagnoses:  Principal Problem:   S/P right TKA  Past Medical History  Diagnosis Date  . BPH (benign prostatic hypertrophy)   . Hyperlipidemia   . Statin intolerance   . LBP (low back pain)   . Osteoarthritis   . MVP (mitral valve prolapse)   . Vitiligo   . Vitamin D deficiency   . Vitamin B 12 deficiency 2011  . Type II or unspecified type diabetes mellitus without mention of complication, not stated as uncontrolled 2011  . Hypertension   . PAC (premature atrial contraction)     HPI: Louis Kemp, 73 y.o. male, has a history of pain and functional disability in the right knee due to arthritis and has failed non-surgical conservative treatments for greater than 12 weeks to include NSAID's and/or analgesics, corticosteriod injections and activity modification. Onset of symptoms was gradual, starting 8 months ago with gradually worsening course since that time. The patient noted no past surgery on the right knee(s). Patient currently rates pain in the right knee(s) at 6 out of 10 with activity. Patient has worsening of pain with activity and weight bearing, pain that interferes with activities of daily living, crepitus and joint swelling. Patient has evidence of periarticular osteophytes and joint space narrowing by imaging studies. There is no active infection. Risks, benefits and expectations were discussed with the patient. Risks including but not limited to the risk of anesthesia, blood clots, nerve damage, blood vessel damage, failure of the prosthesis, infection and up to and including death. Patient understand the risks, benefits and expectations and wishes to  proceed with surgery.   PCP: Walker Kehr, MD   Discharged Condition: good  Hospital Course:  Patient underwent the above stated procedure on 06/15/2014. Patient tolerated the procedure well and brought to the recovery room in good condition and subsequently to the floor.  POD #1 BP: 135/65 ; Pulse: 51 ; Temp: 97.7 F (36.5 C) ; Resp: 16 Patient reports pain as mild to moderate. Tolerating post-op course very well thus far. Ready to be discharged home. Neurovascular intact and dressing c/d/i  LABS  Basename    HGB  12.0  HCT  34.6    Discharge Exam: General appearance: alert, cooperative and no distress Extremities: Homans sign is negative, no sign of DVT, no edema, redness or tenderness in the calves or thighs and no ulcers, gangrene or trophic changes  Disposition: Home with follow up in 2 weeks   Follow-up Information   Follow up with Mauri Pole, MD. Schedule an appointment as soon as possible for a visit in 2 weeks.   Specialty:  Orthopedic Surgery   Contact information:   [address] [place] [address] [phone]       Discharge Instructions   Call MD / Call 911    Complete by:  As directed   If you experience chest pain or shortness of breath, CALL 911 and be transported to the hospital emergency room.  If you develope a fever above 101 F, pus (white drainage) or increased drainage or redness at the wound, or calf pain, call your surgeon's office.     Change dressing    Complete by:  As  directed   Maintain surgical dressing for 10-14 days, or until follow up in the clinic.     Constipation Prevention    Complete by:  As directed   Drink plenty of fluids.  Prune juice may be helpful.  You may use a stool softener, such as Colace (over the counter) 100 mg twice a day.  Use MiraLax (over the counter) for constipation as needed.     Diet - low sodium heart healthy    Complete by:  As directed      Discharge instructions    Complete by:   As directed   Maintain surgical dressing for 10-14 days, or until follow up in the clinic. Follow up in 2 weeks at [facility]. Call with any questions or concerns.     Driving restrictions    Complete by:  As directed   No driving for 4 weeks     Increase activity slowly as tolerated    Complete by:  As directed      TED hose    Complete by:  As directed   Use stockings (TED hose) for 2 weeks on both leg(s).  You may remove them at night for sleeping.     Weight bearing as tolerated    Complete by:  As directed   Laterality:  right  Extremity:  Lower             Medication List         aspirin 325 MG EC tablet  Take 1 tablet (325 mg total) by mouth 2 (two) times daily.     B-D INS SYR MICROFINE 1CC/27G 27G X 5/8" 1 ML Misc  Generic drug:  Insulin Syringe-Needle U-100  as directed.     cholecalciferol 1000 UNITS tablet  Commonly known as:  VITAMIN D  Take 1,000 Units by mouth daily.     cyanocobalamin 1000 MCG/ML injection  Commonly known as:  (VITAMIN B-12)  Inject 1,000 mcg into the muscle every 14 (fourteen) days.     DSS 100 MG Caps  Take 100 mg by mouth 2 (two) times daily.     fenofibrate 145 MG tablet  Commonly known as:  TRICOR  Take 145 mg by mouth daily.     ferrous sulfate 325 (65 FE) MG tablet  Take 1 tablet (325 mg total) by mouth 3 (three) times daily after meals.     HYDROcodone-acetaminophen 7.5-325 MG per tablet  Commonly known as:  NORCO  Take 1-2 tablets by mouth every 4 (four) hours.     losartan 100 MG tablet  Commonly known as:  COZAAR  Take 100 mg by mouth every morning.     metFORMIN 500 MG tablet  Commonly known as:  GLUCOPHAGE  Take 500 mg by mouth 2 (two) times daily with a meal.     nebivolol 10 MG tablet  Commonly known as:  BYSTOLIC  Take 10 mg by mouth every morning.     polyethylene glycol packet  Commonly known as:  MIRALAX / GLYCOLAX  Take 17 g by mouth 2 (two) times daily.     terazosin 2 MG capsule    Commonly known as:  HYTRIN  Take 2 mg by mouth daily.     tiZANidine 4 MG tablet  Commonly known as:  ZANAFLEX  Take 1 tablet (4 mg total) by mouth every 6 (six) hours as needed for muscle spasms.         Signed: West Pugh. Babish  PA-C  06/17/2014, 10:45 AM

## 2014-06-24 ENCOUNTER — Telehealth: Payer: Self-pay | Admitting: *Deleted

## 2014-06-24 DIAGNOSIS — E119 Type 2 diabetes mellitus without complications: Secondary | ICD-10-CM

## 2014-06-24 NOTE — Telephone Encounter (Signed)
Spoke with patient and he is come recovering from a knee replacement.  When he is able he will come to the office for labs. Lipid panel ordered.

## 2014-07-30 ENCOUNTER — Other Ambulatory Visit: Payer: Self-pay | Admitting: Internal Medicine

## 2014-09-01 ENCOUNTER — Encounter (HOSPITAL_COMMUNITY): Payer: Self-pay | Admitting: Pharmacy Technician

## 2014-09-03 ENCOUNTER — Encounter (HOSPITAL_COMMUNITY): Payer: Self-pay

## 2014-09-03 ENCOUNTER — Encounter (HOSPITAL_COMMUNITY)
Admission: RE | Admit: 2014-09-03 | Discharge: 2014-09-03 | Disposition: A | Discharge status: Home / Self Care | Payer: Medicare Other | Source: Ambulatory Visit | Attending: Orthopedic Surgery | Admitting: Orthopedic Surgery

## 2014-09-03 DIAGNOSIS — M1710 Unilateral primary osteoarthritis, unspecified knee: Secondary | ICD-10-CM | POA: Diagnosis present

## 2014-09-03 DIAGNOSIS — Z01812 Encounter for preprocedural laboratory examination: Secondary | ICD-10-CM | POA: Insufficient documentation

## 2014-09-03 LAB — APTT: aPTT: 29 seconds (ref 24–37)

## 2014-09-03 LAB — SURGICAL PCR SCREEN
MRSA, PCR: NEGATIVE
Staphylococcus aureus: NEGATIVE

## 2014-09-03 LAB — CBC
HCT: 41.4 % (ref 39.0–52.0)
Hemoglobin: 13.8 g/dL (ref 13.0–17.0)
MCH: 29.2 pg (ref 26.0–34.0)
MCHC: 33.3 g/dL (ref 30.0–36.0)
MCV: 87.5 fL (ref 78.0–100.0)
Platelets: 337 10*3/uL (ref 150–400)
RBC: 4.73 MIL/uL (ref 4.22–5.81)
RDW: 13.4 % (ref 11.5–15.5)
WBC: 6.7 10*3/uL (ref 4.0–10.5)

## 2014-09-03 LAB — BASIC METABOLIC PANEL
Anion gap: 11 (ref 5–15)
BUN: 18 mg/dL (ref 6–23)
CO2: 23 mEq/L (ref 19–32)
Calcium: 9.8 mg/dL (ref 8.4–10.5)
Chloride: 100 mEq/L (ref 96–112)
Creatinine, Ser: 1.25 mg/dL (ref 0.50–1.35)
GFR calc Af Amer: 65 mL/min — ABNORMAL LOW (ref >90)
GFR calc non Af Amer: 56 mL/min — ABNORMAL LOW (ref >90)
Glucose, Bld: 103 mg/dL — ABNORMAL HIGH (ref 70–99)
Potassium: 4.6 mEq/L (ref 3.7–5.3)
Sodium: 134 mEq/L — ABNORMAL LOW (ref 137–147)

## 2014-09-03 LAB — URINALYSIS, ROUTINE W REFLEX MICROSCOPIC
Bilirubin Urine: NEGATIVE
Glucose, UA: NEGATIVE mg/dL
Hgb urine dipstick: NEGATIVE
Ketones, ur: NEGATIVE mg/dL
Leukocytes, UA: NEGATIVE
Nitrite: NEGATIVE
Protein, ur: NEGATIVE mg/dL
Specific Gravity, Urine: 1.021 (ref 1.005–1.030)
Urobilinogen, UA: 0.2 mg/dL (ref 0.0–1.0)
pH: 6.5 (ref 5.0–8.0)

## 2014-09-03 LAB — PROTIME-INR
INR: 1.01 (ref 0.00–1.49)
Prothrombin Time: 13.3 seconds (ref 11.6–15.2)

## 2014-09-03 NOTE — Pre-Procedure Instructions (Signed)
09-03-14 EKG/ CXR 6'15 [facility].

## 2014-09-03 NOTE — Patient Instructions (Addendum)
[place]  09/03/2014   Your procedure is scheduled on:   09-14-2014  Enter through [facility] Entrance and follow signs to [place]. Arrive at      1130  AM .  Call this number if you have problems the morning of surgery: [phone]  Or Presurgical Testing [phone].   For Living Will and/or Health Care Power Attorney Forms: please provide copy for your medical record,may bring AM of surgery(Forms should be already notarized -we do not provide this service).(9-10-15Yes, information already provided 6'15.).      Do not eat food/ or drink: After Midnight.  Exception: may have clear liquids:up to 6 Hours before arrival. Nothing after: 0800 AM  Clear liquids include soda, tea, black coffee, apple or grape juice, broth.  Take these medicines the morning of surgery with A SIP OF WATER: Fenofibrate. Bystolic. Hytrin.   Do not wear jewelry, make-up or nail polish.  Do not wear lotions, powders, or perfumes. You may not  wear deodorant.  Do not shave 48 hours(2 days) prior to first CHG shower(legs and under arms).(Shaving face and neck okay.)  Do not bring valuables to the hospital.(Hospital is not responsible for lost valuables).  Contacts, dentures or removable bridgework, body piercing, hair pins may not be worn into surgery.  Leave suitcase in the car. After surgery it may be brought to your room.  For patients admitted to the hospital, checkout time is 11:00 AM the day of discharge.(Restricted visitors-Any Persons displaying flu-like symptoms or illness).    Patients discharged the day of surgery will not be allowed to drive home. Must have responsible person with you x 24 hours once discharged.  Name and phone number of your driver:  Spouse [phone]  Special Instructions: CHG(Chlorhedine 4%-"Hibiclens","Betasept","Aplicare") Shower Use Special Wash: see special instructions.(avoid face and genitals)   Please read over the following fact sheets that you were  given: MRSA Information, Blood Transfusion fact sheet, Incentive Spirometry Instruction.              CLEAR LIQUID DIET   Foods Allowed                                                                     Foods Excluded  Coffee and tea, regular and decaf                             liquids that you cannot  Plain Jell-O in any flavor                                             see through such as: Fruit ices (not with fruit pulp)                                     milk, soups, orange juice  Iced Popsicles  All solid food Carbonated beverages, regular and diet                                    Cranberry, grape and apple juices Sports drinks like Gatorade Lightly seasoned clear broth or consume(fat free) Sugar, honey syrup  Sample Menu Breakfast                                Lunch                                     Supper Cranberry juice                    Beef broth                            Chicken broth Jell-O                                     Grape juice                           Apple juice Coffee or tea                        Jell-O                                      Popsicle                                                Coffee or tea                        Coffee or tea  _____________________________________________________________________  [facility] - Preparing for Surgery Before surgery, you can play an important role.  Because skin is not sterile, your skin needs to be as free of germs as possible.  You can reduce the number of germs on your skin by washing with CHG (chlorahexidine gluconate) soap before surgery.  CHG is an antiseptic cleaner which kills germs and bonds with the skin to continue killing germs even after washing. Please DO NOT use if you have an allergy to CHG or antibacterial soaps.  If your skin becomes reddened/irritated stop using the CHG and inform your nurse when you arrive at Short Stay. Do not shave  (including legs and underarms) for at least 48 hours prior to the first CHG shower.  You may shave your face/neck. Please follow these instructions carefully:  1.  Shower with CHG Soap the night before surgery and the  morning of Surgery.  2.  If you choose to wash your hair, wash your hair first as usual with your  normal  shampoo.  3.  After you shampoo, rinse your hair and body thoroughly to remove the  shampoo.  4.  Use CHG as you would any other liquid soap.  You can apply chg directly  to the skin and wash                       Gently with a scrungie or clean washcloth.  5.  Apply the CHG Soap to your body ONLY FROM THE NECK DOWN.   Do not use on face/ open                           Wound or open sores. Avoid contact with eyes, ears mouth and genitals (private parts).                       Wash face,  Genitals (private parts) with your normal soap.             6.  Wash thoroughly, paying special attention to the area where your surgery  will be performed.  7.  Thoroughly rinse your body with warm water from the neck down.  8.  DO NOT shower/wash with your normal soap after using and rinsing off  the CHG Soap.                9.  Pat yourself dry with a clean towel.            10.  Wear clean pajamas.            11.  Place clean sheets on your bed the night of your first shower and do not  sleep with pets. Day of Surgery : Do not apply any lotions/deodorants the morning of surgery.  Please wear clean clothes to the hospital/surgery center.  FAILURE TO FOLLOW THESE INSTRUCTIONS MAY RESULT IN THE CANCELLATION OF YOUR SURGERY PATIENT SIGNATURE_________________________________  NURSE SIGNATURE__________________________________  ________________________________________________________________________   Adam Phenix  An incentive spirometer is a tool that can help keep your lungs clear and active. This tool measures how well you are filling your lungs with  each breath. Taking long deep breaths may help reverse or decrease the chance of developing breathing (pulmonary) problems (especially infection) following:  A long period of time when you are unable to move or be active. BEFORE THE PROCEDURE   If the spirometer includes an indicator to show your best effort, your nurse or respiratory therapist will set it to a desired goal.  If possible, sit up straight or lean slightly forward. Try not to slouch.  Hold the incentive spirometer in an upright position. INSTRUCTIONS FOR USE  1. Sit on the edge of your bed if possible, or sit up as far as you can in bed or on a chair. 2. Hold the incentive spirometer in an upright position. 3. Breathe out normally. 4. Place the mouthpiece in your mouth and seal your lips tightly around it. 5. Breathe in slowly and as deeply as possible, raising the piston or the ball toward the top of the column. 6. Hold your breath for 3-5 seconds or for as long as possible. Allow the piston or ball to fall to the bottom of the column. 7. Remove the mouthpiece from your mouth and breathe out normally. 8. Rest for a few seconds and repeat Steps 1 through 7 at least 10 times every 1-2 hours when you are awake. Take your time and take a few normal breaths between deep breaths. 9. The spirometer may include an indicator to show  your best effort. Use the indicator as a goal to work toward during each repetition. 10. After each set of 10 deep breaths, practice coughing to be sure your lungs are clear. If you have an incision (the cut made at the time of surgery), support your incision when coughing by placing a pillow or rolled up towels firmly against it. Once you are able to get out of bed, walk around indoors and cough well. You may stop using the incentive spirometer when instructed by your caregiver.  RISKS AND COMPLICATIONS  Take your time so you do not get dizzy or light-headed.  If you are in pain, you may need to take or  ask for pain medication before doing incentive spirometry. It is harder to take a deep breath if you are having pain. AFTER USE  Rest and breathe slowly and easily.  It can be helpful to keep track of a log of your progress. Your caregiver can provide you with a simple table to help with this. If you are using the spirometer at home, follow these instructions: [place] IF:   You are having difficultly using the spirometer.  You have trouble using the spirometer as often as instructed.  Your pain medication is not giving enough relief while using the spirometer.  You develop fever of 100.5 F (38.1 C) or higher. SEEK IMMEDIATE MEDICAL CARE IF:   You cough up bloody sputum that had not been present before.  You develop fever of 102 F (38.9 C) or greater.  You develop worsening pain at or near the incision site. MAKE SURE YOU:   Understand these instructions.  Will watch your condition.  Will get help right away if you are not doing well or get worse. Document Released: 04/23/2007 Document Revised: 03/04/2012 Document Reviewed: 06/24/2007 Exit[employer] Patient Information 2014 Exit[employer], [place].   ________________________________________________________________________  WHAT IS A BLOOD TRANSFUSION? Blood Transfusion Information  A transfusion is the replacement of blood or some of its parts. Blood is made up of multiple cells which provide different functions.  Red blood cells carry oxygen and are used for blood loss replacement.  White blood cells fight against infection.  Platelets control bleeding.  Plasma helps clot blood.  Other blood products are available for specialized needs, such as hemophilia or other clotting disorders. BEFORE THE TRANSFUSION  Who gives blood for transfusions?   Healthy volunteers who are fully evaluated to make sure their blood is safe. This is blood bank blood. Transfusion therapy is the safest it has ever been in the practice of  medicine. Before blood is taken from a donor, a complete history is taken to make sure that person has no history of diseases nor engages in risky social behavior (examples are intravenous drug use or sexual activity with multiple partners). The donor's travel history is screened to minimize risk of transmitting infections, such as malaria. The donated blood is tested for signs of infectious diseases, such as HIV and hepatitis. The blood is then tested to be sure it is compatible with you in order to minimize the chance of a transfusion reaction. If you or a relative donates blood, this is often done in anticipation of surgery and is not appropriate for emergency situations. It takes many days to process the donated blood. RISKS AND COMPLICATIONS Although transfusion therapy is very safe and saves many lives, the main dangers of transfusion include:   Getting an infectious disease.  Developing a transfusion reaction. This is an allergic reaction to  something in the blood you were given. Every precaution is taken to prevent this. The decision to have a blood transfusion has been considered carefully by your caregiver before blood is given. Blood is not given unless the benefits outweigh the risks. AFTER THE TRANSFUSION  Right after receiving a blood transfusion, you will usually feel much better and more energetic. This is especially true if your red blood cells have gotten low (anemic). The transfusion raises the level of the red blood cells which carry oxygen, and this usually causes an energy increase.  The nurse administering the transfusion will monitor you carefully for complications. HOME CARE INSTRUCTIONS  No special instructions are needed after a transfusion. You may find your energy is better. Speak with your caregiver about any limitations on activity for underlying diseases you may have. SEEK MEDICAL CARE IF:   Your condition is not improving after your transfusion.  You develop  redness or irritation at the intravenous (IV) site. SEEK IMMEDIATE MEDICAL CARE IF:  Any of the following symptoms occur over the next 12 hours:  Shaking chills.  You have a temperature by mouth above 102 F (38.9 C), not controlled by medicine.  Chest, back, or muscle pain.  People around you feel you are not acting correctly or are confused.  Shortness of breath or difficulty breathing.  Dizziness and fainting.  You get a rash or develop hives.  You have a decrease in urine output.  Your urine turns a dark color or changes to pink, red, or brown. Any of the following symptoms occur over the next 10 days:  You have a temperature by mouth above 102 F (38.9 C), not controlled by medicine.  Shortness of breath.  Weakness after normal activity.  The white part of the eye turns yellow (jaundice).  You have a decrease in the amount of urine or are urinating less often.  Your urine turns a dark color or changes to pink, red, or brown. Document Released: 12/08/2000 Document Revised: 03/04/2012 Document Reviewed: 07/27/2008 [facility] Patient Information 2014 [place], [place].  _______________________________________________________________________

## 2014-09-05 NOTE — H&P (Signed)
UNICOMPARTMENTAL KNEE ARTHROPLASTY ADMISSION H&P  Patient is being admitted for left medial unicompartmental knee arthroplasty.  Subjective:  Chief Complaint:   Left knee medial compartmental OA / pain.  HPI: Rayland Hamed, 73 y.o. male, has a history of pain and functional disability in the left knee due to arthritis and has failed non-surgical conservative treatments for greater than 12 weeks to include NSAID's and/or analgesics, corticosteriod injections and activity modification.  Onset of symptoms was gradual, starting 1+ years ago with gradually worsening course since that time. The patient noted prior procedures on the knee to include  arthroplasty on the right knee(s).  Patient currently rates pain in the left knee(s) at 6 out of 10 with activity. Patient has worsening of pain with activity and weight bearing, pain that interferes with activities of daily living, pain with passive range of motion, crepitus and joint swelling.  Patient has evidence of periarticular osteophytes and joint space narrowing of the medial compartment by imaging studies.  There is no active infection.  Risks, benefits and expectations were discussed with the patient.  Risks including but not limited to the risk of anesthesia, blood clots, nerve damage, blood vessel damage, failure of the prosthesis, infection and up to and including death.  Patient understand the risks, benefits and expectations and wishes to proceed with surgery.   PCP: Walker Kehr, MD  D/C Plans:      Home with HHPT  Post-op Meds:       No Rx given   Tranexamic Acid:      To be given - IV    Decadron:      Is not to be given  FYI:     ASA post-op  Norco post-op    Patient Active Problem List   Diagnosis Date Noted  . S/P right TKA 06/15/2014  . Osteoarthritis, knee 11/24/2013  . Weight loss 06/24/2013  . PAC (premature atrial contraction) 01/13/2013  . Heart murmur 01/13/2013  . Well adult exam 11/03/2012  . Paresthesia 10/22/2012   . Hypertension 04/18/2012  . DIABETES MELLITUS, TYPE II 10/20/2010  . VITAMIN B12 DEFICIENCY 10/20/2010  . VITAMIN D DEFICIENCY 10/20/2010  . HYPERLIPIDEMIA 07/14/2010  . BENIGN PROSTATIC HYPERTROPHY 07/14/2010  . OSTEOARTHRITIS 07/14/2010  . LOW BACK PAIN 07/14/2010  . HYPERGLYCEMIA 07/14/2010   Past Medical History  Diagnosis Date  . BPH (benign prostatic hypertrophy)   . Hyperlipidemia   . Statin intolerance   . LBP (low back pain)   . Osteoarthritis   . MVP (mitral valve prolapse)   . Vitiligo   . Vitamin D deficiency   . Vitamin B 12 deficiency 2011  . Type II or unspecified type diabetes mellitus without mention of complication, not stated as uncontrolled 2011  . Hypertension   . PAC (premature atrial contraction)     Past Surgical History  Procedure Laterality Date  . Cataract extraction  2012  . Arm surgery Left     ORIF left  forearm  . Hernia repair Bilateral 1971  . Total knee arthroplasty Right 06/15/2014    Procedure: RIGHT TOTAL KNEE ARTHROPLASTY;  Surgeon: Mauri Pole, MD;  Location: [facility] ORS;  Service: Orthopedics;  Laterality: Right;    No prescriptions prior to admission   Allergies  Allergen Reactions  . Atorvastatin     REACTION: aches  . Rosuvastatin     REACTION: aches    History  Substance Use Topics  . Smoking status: Current Some Day Smoker    Types: Cigars  .  Smokeless tobacco: Never Used  . Alcohol Use: Yes     Comment: 3-4 mixed drinks week    Family History  Problem Relation Age of Onset  . Hypertension Father   . Coronary artery disease Father   . Hypertension Brother   . Hypertension Other   . Arthritis Mother   . Hypertension Brother   . Coronary artery disease Other      Review of Systems  Constitutional: Negative.   HENT: Negative.   Eyes: Negative.   Respiratory: Negative.   Cardiovascular: Negative.   Gastrointestinal: Negative.   Genitourinary: Positive for frequency.  Musculoskeletal: Positive for back pain  and joint pain.  Skin: Negative.   Neurological: Negative.   Endo/Heme/Allergies: Negative.   Psychiatric/Behavioral: Negative.     Objective:  Physical Exam  Constitutional: He is oriented to person, place, and time. He appears well-developed and well-nourished.  HENT:  Head: Normocephalic and atraumatic.  Mouth/Throat: Oropharynx is clear and moist.  Eyes: Pupils are equal, round, and reactive to light.  Neck: Neck supple. No JVD present. No tracheal deviation present. No thyromegaly present.  Cardiovascular: Normal rate, regular rhythm and intact distal pulses.   Murmur heard. Respiratory: Effort normal and breath sounds normal. No stridor. No respiratory distress. He has no wheezes.  GI: Soft. There is no tenderness. There is no guarding.  Musculoskeletal:       Left knee: He exhibits decreased range of motion, swelling and bony tenderness. He exhibits no deformity, no laceration, no erythema and normal alignment. Tenderness found. Medial joint line tenderness noted. No lateral joint line tenderness noted.  Lymphadenopathy:    He has no cervical adenopathy.  Neurological: He is alert and oriented to person, place, and time.  Skin: Skin is warm and dry.  Psychiatric: He has a normal mood and affect.     Labs:  Estimated body mass index is 23.49 kg/(m^2) as calculated from the following:   Height as of 06/15/14: 5\' 7"  (1.702 m).   Weight as of 06/10/14: 68.04 kg (150 lb).   Imaging Review Plain radiographs demonstrate severe degenerative joint disease of the left knee medial compartment. The overall alignment is neutral. The bone quality appears to be good for age and reported activity level.  Assessment/Plan:  End stage arthritis of the medial compartment, left knee   The patient history, physical examination, clinical judgment of the provider and imaging studies are consistent with end stage degenerative joint disease of the left knee(s) and medial unicompartmental knee  arthroplasty is deemed medically necessary. The treatment options including medical management, injection therapy arthroscopy and arthroplasty were discussed at length. The risks and benefits of total knee arthroplasty were presented and reviewed. The risks due to aseptic loosening, infection, stiffness, patella tracking problems, thromboembolic complications and other imponderables were discussed. The patient acknowledged the explanation, agreed to proceed with the plan and consent was signed. Patient is being admitted for outpatient / observation treatment for surgery, pain control, PT, OT, prophylactic antibiotics, VTE prophylaxis, progressive ambulation and ADL's and discharge planning. The patient is planning to be discharged home with home health services.     West Pugh Babish   PA-C  09/05/2014, 5:21 PM

## 2014-09-14 ENCOUNTER — Encounter (HOSPITAL_COMMUNITY)
Admission: RE | Disposition: A | Discharge status: Home / Self Care | Payer: Self-pay | Source: Ambulatory Visit | Attending: Orthopedic Surgery

## 2014-09-14 ENCOUNTER — Encounter (HOSPITAL_COMMUNITY): Payer: Medicare Other | Admitting: Certified Registered"

## 2014-09-14 ENCOUNTER — Observation Stay (HOSPITAL_COMMUNITY)
Admission: RE | Admit: 2014-09-14 | Discharge: 2014-09-15 | Disposition: A | Discharge status: Home / Self Care | Payer: Medicare Other | Source: Ambulatory Visit | Attending: Orthopedic Surgery | Admitting: Orthopedic Surgery

## 2014-09-14 ENCOUNTER — Ambulatory Visit (HOSPITAL_COMMUNITY): Payer: Medicare Other | Admitting: Certified Registered"

## 2014-09-14 ENCOUNTER — Encounter (HOSPITAL_COMMUNITY): Payer: Self-pay | Admitting: *Deleted

## 2014-09-14 DIAGNOSIS — M1710 Unilateral primary osteoarthritis, unspecified knee: Principal | ICD-10-CM | POA: Insufficient documentation

## 2014-09-14 DIAGNOSIS — E785 Hyperlipidemia, unspecified: Secondary | ICD-10-CM | POA: Insufficient documentation

## 2014-09-14 DIAGNOSIS — E559 Vitamin D deficiency, unspecified: Secondary | ICD-10-CM | POA: Insufficient documentation

## 2014-09-14 DIAGNOSIS — I059 Rheumatic mitral valve disease, unspecified: Secondary | ICD-10-CM | POA: Diagnosis not present

## 2014-09-14 DIAGNOSIS — I491 Atrial premature depolarization: Secondary | ICD-10-CM | POA: Insufficient documentation

## 2014-09-14 DIAGNOSIS — E119 Type 2 diabetes mellitus without complications: Secondary | ICD-10-CM | POA: Diagnosis not present

## 2014-09-14 DIAGNOSIS — E538 Deficiency of other specified B group vitamins: Secondary | ICD-10-CM | POA: Insufficient documentation

## 2014-09-14 DIAGNOSIS — L80 Vitiligo: Secondary | ICD-10-CM | POA: Insufficient documentation

## 2014-09-14 DIAGNOSIS — Z79899 Other long term (current) drug therapy: Secondary | ICD-10-CM | POA: Insufficient documentation

## 2014-09-14 DIAGNOSIS — N400 Enlarged prostate without lower urinary tract symptoms: Secondary | ICD-10-CM | POA: Insufficient documentation

## 2014-09-14 DIAGNOSIS — Z7982 Long term (current) use of aspirin: Secondary | ICD-10-CM | POA: Diagnosis not present

## 2014-09-14 DIAGNOSIS — I10 Essential (primary) hypertension: Secondary | ICD-10-CM | POA: Insufficient documentation

## 2014-09-14 DIAGNOSIS — Z96652 Presence of left artificial knee joint: Secondary | ICD-10-CM

## 2014-09-14 HISTORY — PX: PARTIAL KNEE ARTHROPLASTY: SHX2174

## 2014-09-14 LAB — GLUCOSE, CAPILLARY
Glucose-Capillary: 109 mg/dL — ABNORMAL HIGH (ref 70–99)
Glucose-Capillary: 112 mg/dL — ABNORMAL HIGH (ref 70–99)
Glucose-Capillary: 192 mg/dL — ABNORMAL HIGH (ref 70–99)
Glucose-Capillary: 186 mg/dL — ABNORMAL HIGH (ref 70–99)

## 2014-09-14 LAB — TYPE AND SCREEN
ABO/RH(D): O POS
Antibody Screen: NEGATIVE

## 2014-09-14 SURGERY — ARTHROPLASTY, KNEE, UNICOMPARTMENTAL
Anesthesia: Spinal | Site: Knee | Laterality: Left

## 2014-09-14 MED ORDER — SODIUM CHLORIDE 0.9 % IJ SOLN
INTRAMUSCULAR | Status: DC | PRN
  Administered 2014-09-14: 9 mL

## 2014-09-14 MED ORDER — LOSARTAN POTASSIUM 50 MG PO TABS
100.0000 mg | ORAL_TABLET | Freq: Every morning | ORAL | Status: DC
  Administered 2014-09-14 – 2014-09-15 (×2): 100 mg via ORAL
  Filled 2014-09-14 (×2): qty 2

## 2014-09-14 MED ORDER — METOCLOPRAMIDE HCL 10 MG PO TABS: 5.0000 mg | ORAL_TABLET | Freq: Three times a day (TID) | ORAL | Status: DC | PRN

## 2014-09-14 MED ORDER — HYDROCODONE-ACETAMINOPHEN 7.5-325 MG PO TABS
1.0000 | ORAL_TABLET | ORAL | Status: DC
  Administered 2014-09-14 – 2014-09-15 (×5): 1 via ORAL
  Filled 2014-09-14: qty 1
  Filled 2014-09-14 (×2): qty 2
  Filled 2014-09-14 (×2): qty 1
  Filled 2014-09-14: qty 2

## 2014-09-14 MED ORDER — FENTANYL CITRATE 0.05 MG/ML IJ SOLN
INTRAMUSCULAR | Status: DC | PRN
  Administered 2014-09-14 (×2): 50 ug via INTRAVENOUS

## 2014-09-14 MED ORDER — KETOROLAC TROMETHAMINE 30 MG/ML IJ SOLN
INTRAMUSCULAR | Status: DC | PRN
  Administered 2014-09-14: 30 mg

## 2014-09-14 MED ORDER — MIDAZOLAM HCL 5 MG/5ML IJ SOLN
INTRAMUSCULAR | Status: DC | PRN
  Administered 2014-09-14: 2 mg via INTRAVENOUS

## 2014-09-14 MED ORDER — PHENOL 1.4 % MT LIQD
1.0000 | OROMUCOSAL | Status: DC | PRN
  Filled 2014-09-14: qty 177

## 2014-09-14 MED ORDER — PROPOFOL 10 MG/ML IV BOLUS
INTRAVENOUS | Status: AC
  Filled 2014-09-14: qty 20

## 2014-09-14 MED ORDER — DIPHENHYDRAMINE HCL 25 MG PO CAPS: 25.0000 mg | ORAL_CAPSULE | Freq: Four times a day (QID) | ORAL | Status: DC | PRN

## 2014-09-14 MED ORDER — SODIUM CHLORIDE 0.9 % IV SOLN
1000.0000 mg | Freq: Once | INTRAVENOUS | Status: AC
  Administered 2014-09-14: 1000 mg via INTRAVENOUS
  Filled 2014-09-14: qty 10

## 2014-09-14 MED ORDER — DOCUSATE SODIUM 100 MG PO CAPS
100.0000 mg | ORAL_CAPSULE | Freq: Two times a day (BID) | ORAL | Status: DC
  Administered 2014-09-15: 100 mg via ORAL

## 2014-09-14 MED ORDER — BUPIVACAINE IN DEXTROSE 0.75-8.25 % IT SOLN
INTRATHECAL | Status: DC | PRN
  Administered 2014-09-14: 1.8 mL via INTRATHECAL

## 2014-09-14 MED ORDER — BISACODYL 10 MG RE SUPP: 10.0000 mg | Freq: Every day | RECTAL | Status: DC | PRN

## 2014-09-14 MED ORDER — SODIUM CHLORIDE 0.9 % IJ SOLN
INTRAMUSCULAR | Status: AC
  Filled 2014-09-14: qty 10

## 2014-09-14 MED ORDER — METHOCARBAMOL 1000 MG/10ML IJ SOLN
500.0000 mg | Freq: Four times a day (QID) | INTRAVENOUS | Status: DC | PRN
  Administered 2014-09-14: 500 mg via INTRAVENOUS
  Filled 2014-09-14: qty 5

## 2014-09-14 MED ORDER — CHLORHEXIDINE GLUCONATE 4 % EX LIQD: 60.0000 mL | Freq: Once | CUTANEOUS | Status: DC

## 2014-09-14 MED ORDER — ONDANSETRON HCL 4 MG/2ML IJ SOLN
INTRAMUSCULAR | Status: DC | PRN
  Administered 2014-09-14: 4 mg via INTRAVENOUS

## 2014-09-14 MED ORDER — ONDANSETRON HCL 4 MG/2ML IJ SOLN
INTRAMUSCULAR | Status: AC
  Filled 2014-09-14: qty 2

## 2014-09-14 MED ORDER — EPHEDRINE SULFATE 50 MG/ML IJ SOLN
INTRAMUSCULAR | Status: DC | PRN
  Administered 2014-09-14 (×4): 5 mg via INTRAVENOUS

## 2014-09-14 MED ORDER — BUPIVACAINE-EPINEPHRINE (PF) 0.25% -1:200000 IJ SOLN
INTRAMUSCULAR | Status: AC
  Filled 2014-09-14: qty 30

## 2014-09-14 MED ORDER — FENOFIBRATE 160 MG PO TABS
160.0000 mg | ORAL_TABLET | Freq: Every day | ORAL | Status: DC
  Administered 2014-09-15: 160 mg via ORAL
  Filled 2014-09-14 (×2): qty 1

## 2014-09-14 MED ORDER — CEFAZOLIN SODIUM-DEXTROSE 2-3 GM-% IV SOLR
INTRAVENOUS | Status: AC
  Filled 2014-09-14: qty 50

## 2014-09-14 MED ORDER — BUPIVACAINE LIPOSOME 1.3 % IJ SUSP
20.0000 mL | Freq: Once | INTRAMUSCULAR | Status: AC
  Administered 2014-09-14: 20 mL
  Filled 2014-09-14: qty 20

## 2014-09-14 MED ORDER — BUPIVACAINE-EPINEPHRINE 0.25% -1:200000 IJ SOLN
INTRAMUSCULAR | Status: DC | PRN
  Administered 2014-09-14: 30 mL

## 2014-09-14 MED ORDER — LIDOCAINE HCL (CARDIAC) 20 MG/ML IV SOLN
INTRAVENOUS | Status: AC
  Filled 2014-09-14: qty 5

## 2014-09-14 MED ORDER — FENTANYL CITRATE 0.05 MG/ML IJ SOLN: 25.0000 ug | INTRAMUSCULAR | Status: DC | PRN

## 2014-09-14 MED ORDER — POTASSIUM CHLORIDE 2 MEQ/ML IV SOLN
INTRAVENOUS | Status: DC
  Administered 2014-09-14: 14:00:00 via INTRAVENOUS
  Filled 2014-09-14 (×4): qty 1000

## 2014-09-14 MED ORDER — METHOCARBAMOL 500 MG PO TABS
500.0000 mg | ORAL_TABLET | Freq: Four times a day (QID) | ORAL | Status: DC | PRN
Start: 2014-09-14 — End: 2014-09-15
  Administered 2014-09-15 (×2): 500 mg via ORAL
  Filled 2014-09-14 (×3): qty 1

## 2014-09-14 MED ORDER — DEXAMETHASONE SODIUM PHOSPHATE 10 MG/ML IJ SOLN
INTRAMUSCULAR | Status: AC
Start: 2014-09-14 — End: 2014-09-14
  Filled 2014-09-14: qty 1

## 2014-09-14 MED ORDER — NEBIVOLOL HCL 10 MG PO TABS
10.0000 mg | ORAL_TABLET | Freq: Every morning | ORAL | Status: DC
  Administered 2014-09-15: 10 mg via ORAL
  Filled 2014-09-14: qty 1

## 2014-09-14 MED ORDER — FENTANYL CITRATE 0.05 MG/ML IJ SOLN
INTRAMUSCULAR | Status: AC
  Filled 2014-09-14: qty 2

## 2014-09-14 MED ORDER — MEPERIDINE HCL 50 MG/ML IJ SOLN: 6.2500 mg | INTRAMUSCULAR | Status: DC | PRN

## 2014-09-14 MED ORDER — LIDOCAINE HCL (CARDIAC) 20 MG/ML IV SOLN
INTRAVENOUS | Status: DC | PRN
  Administered 2014-09-14: 100 mg via INTRAVENOUS

## 2014-09-14 MED ORDER — KETOROLAC TROMETHAMINE 30 MG/ML IJ SOLN
INTRAMUSCULAR | Status: AC
  Filled 2014-09-14: qty 1

## 2014-09-14 MED ORDER — POLYETHYLENE GLYCOL 3350 17 G PO PACK: 17.0000 g | PACK | Freq: Two times a day (BID) | ORAL | Status: DC

## 2014-09-14 MED ORDER — FERROUS SULFATE 325 (65 FE) MG PO TABS
325.0000 mg | ORAL_TABLET | Freq: Three times a day (TID) | ORAL | Status: DC
  Administered 2014-09-14 – 2014-09-15 (×2): 325 mg via ORAL
  Filled 2014-09-14 (×5): qty 1

## 2014-09-14 MED ORDER — MIDAZOLAM HCL 2 MG/2ML IJ SOLN
INTRAMUSCULAR | Status: AC
  Filled 2014-09-14: qty 2

## 2014-09-14 MED ORDER — CEFAZOLIN SODIUM-DEXTROSE 2-3 GM-% IV SOLR
2.0000 g | INTRAVENOUS | Status: AC
  Administered 2014-09-14: 2 g via INTRAVENOUS

## 2014-09-14 MED ORDER — ASPIRIN EC 325 MG PO TBEC
325.0000 mg | DELAYED_RELEASE_TABLET | Freq: Two times a day (BID) | ORAL | Status: DC
  Administered 2014-09-15: 325 mg via ORAL
  Filled 2014-09-14 (×3): qty 1

## 2014-09-14 MED ORDER — METFORMIN HCL 500 MG PO TABS
500.0000 mg | ORAL_TABLET | Freq: Two times a day (BID) | ORAL | Status: DC
  Administered 2014-09-15: 500 mg via ORAL
  Filled 2014-09-14 (×3): qty 1

## 2014-09-14 MED ORDER — CEFAZOLIN SODIUM-DEXTROSE 2-3 GM-% IV SOLR
2.0000 g | Freq: Four times a day (QID) | INTRAVENOUS | Status: AC
  Administered 2014-09-14 – 2014-09-15 (×2): 2 g via INTRAVENOUS
  Filled 2014-09-14 (×2): qty 50

## 2014-09-14 MED ORDER — MAGNESIUM CITRATE PO SOLN: 1.0000 | Freq: Once | ORAL | Status: AC | PRN

## 2014-09-14 MED ORDER — ALUM & MAG HYDROXIDE-SIMETH 200-200-20 MG/5ML PO SUSP: 30.0000 mL | ORAL | Status: DC | PRN

## 2014-09-14 MED ORDER — LACTATED RINGERS IV SOLN
INTRAVENOUS | Status: DC
  Administered 2014-09-14 (×3): via INTRAVENOUS

## 2014-09-14 MED ORDER — CELECOXIB 200 MG PO CAPS
200.0000 mg | ORAL_CAPSULE | Freq: Two times a day (BID) | ORAL | Status: DC
  Administered 2014-09-14 – 2014-09-15 (×2): 200 mg via ORAL
  Filled 2014-09-14 (×3): qty 1

## 2014-09-14 MED ORDER — DEXAMETHASONE SODIUM PHOSPHATE 10 MG/ML IJ SOLN
INTRAMUSCULAR | Status: DC | PRN
  Administered 2014-09-14: 10 mg via INTRAVENOUS

## 2014-09-14 MED ORDER — ONDANSETRON HCL 4 MG PO TABS: 4.0000 mg | ORAL_TABLET | Freq: Four times a day (QID) | ORAL | Status: DC | PRN

## 2014-09-14 MED ORDER — HYDROMORPHONE HCL 1 MG/ML IJ SOLN
0.5000 mg | INTRAMUSCULAR | Status: DC | PRN
  Administered 2014-09-14: 1 mg via INTRAVENOUS
  Filled 2014-09-14: qty 1

## 2014-09-14 MED ORDER — ONDANSETRON HCL 4 MG/2ML IJ SOLN: 4.0000 mg | Freq: Four times a day (QID) | INTRAMUSCULAR | Status: DC | PRN

## 2014-09-14 MED ORDER — PROPOFOL INFUSION 10 MG/ML OPTIME
INTRAVENOUS | Status: DC | PRN
  Administered 2014-09-14: 75 ug/kg/min via INTRAVENOUS

## 2014-09-14 MED ORDER — MENTHOL 3 MG MT LOZG
1.0000 | LOZENGE | OROMUCOSAL | Status: DC | PRN
  Filled 2014-09-14: qty 9

## 2014-09-14 MED ORDER — METOCLOPRAMIDE HCL 5 MG/ML IJ SOLN: 5.0000 mg | Freq: Three times a day (TID) | INTRAMUSCULAR | Status: DC | PRN

## 2014-09-14 MED ORDER — PROMETHAZINE HCL 25 MG/ML IJ SOLN: 6.2500 mg | INTRAMUSCULAR | Status: DC | PRN

## 2014-09-14 MED ORDER — INSULIN ASPART 100 UNIT/ML ~~LOC~~ SOLN
0.0000 [IU] | Freq: Three times a day (TID) | SUBCUTANEOUS | Status: DC
  Administered 2014-09-14: 3 [IU] via SUBCUTANEOUS

## 2014-09-14 MED ORDER — TERAZOSIN HCL 2 MG PO CAPS
2.0000 mg | ORAL_CAPSULE | Freq: Two times a day (BID) | ORAL | Status: DC
  Administered 2014-09-14 – 2014-09-15 (×2): 2 mg via ORAL
  Filled 2014-09-14 (×3): qty 1

## 2014-09-14 SURGICAL SUPPLY — 50 items
BAG ZIPLOCK 12X15 (MISCELLANEOUS) IMPLANT
BANDAGE ELASTIC 6 VELCRO ST LF (GAUZE/BANDAGES/DRESSINGS) ×2 IMPLANT
BANDAGE ESMARK 6X9 LF (GAUZE/BANDAGES/DRESSINGS) ×1 IMPLANT
BLADE SAW RECIPROCATING 77.5 (BLADE) ×2 IMPLANT
BLADE SAW SGTL 13.0X1.19X90.0M (BLADE) ×2 IMPLANT
BNDG ESMARK 6X9 LF (GAUZE/BANDAGES/DRESSINGS) ×2
BOWL SMART MIX CTS (DISPOSABLE) ×2 IMPLANT
CAPT KNEE OXFORD ×2 IMPLANT
CEMENT HV SMART SET (Cement) ×2 IMPLANT
CUFF TOURN SGL QUICK 34 (TOURNIQUET CUFF) ×1
CUFF TRNQT CYL 34X4X40X1 (TOURNIQUET CUFF) ×1 IMPLANT
DERMABOND ADVANCED (GAUZE/BANDAGES/DRESSINGS) ×1
DERMABOND ADVANCED .7 DNX12 (GAUZE/BANDAGES/DRESSINGS) ×1 IMPLANT
DRAPE EXTREMITY TIBURON (DRAPES) ×2 IMPLANT
DRAPE POUCH INSTRU U-SHP 10X18 (DRAPES) ×2 IMPLANT
DRAPE U-SHAPE 47X51 STRL (DRAPES) ×2 IMPLANT
DRSG AQUACEL AG ADV 3.5X10 (GAUZE/BANDAGES/DRESSINGS) ×2 IMPLANT
DRSG TEGADERM 4X4.75 (GAUZE/BANDAGES/DRESSINGS) IMPLANT
DURAPREP 26ML APPLICATOR (WOUND CARE) ×4 IMPLANT
ELECT REM PT RETURN 9FT ADLT (ELECTROSURGICAL) ×2
ELECTRODE REM PT RTRN 9FT ADLT (ELECTROSURGICAL) ×1 IMPLANT
EVACUATOR 1/8 PVC DRAIN (DRAIN) IMPLANT
FACESHIELD WRAPAROUND (MASK) ×8 IMPLANT
GAUZE SPONGE 2X2 8PLY STRL LF (GAUZE/BANDAGES/DRESSINGS) IMPLANT
GLOVE BIOGEL PI IND STRL 7.5 (GLOVE) ×6 IMPLANT
GLOVE BIOGEL PI IND STRL 8.5 (GLOVE) ×1 IMPLANT
GLOVE BIOGEL PI INDICATOR 7.5 (GLOVE) ×6
GLOVE BIOGEL PI INDICATOR 8.5 (GLOVE) ×1
GLOVE ECLIPSE 8.0 STRL XLNG CF (GLOVE) ×2 IMPLANT
GLOVE ORTHO TXT STRL SZ7.5 (GLOVE) ×4 IMPLANT
GOWN SPEC L3 XXLG W/TWL (GOWN DISPOSABLE) ×2 IMPLANT
GOWN STRL REUS W/TWL LRG LVL3 (GOWN DISPOSABLE) ×6 IMPLANT
KIT BASIN OR (CUSTOM PROCEDURE TRAY) ×2 IMPLANT
LEGGING LITHOTOMY PAIR STRL (DRAPES) ×2 IMPLANT
MANIFOLD NEPTUNE II (INSTRUMENTS) ×2 IMPLANT
NDL SAFETY ECLIPSE 18X1.5 (NEEDLE) ×1 IMPLANT
NEEDLE HYPO 18GX1.5 SHARP (NEEDLE) ×1
PACK ICE MAXI GEL EZY WRAP (MISCELLANEOUS) ×2 IMPLANT
PACK TOTAL JOINT (CUSTOM PROCEDURE TRAY) ×2 IMPLANT
SPONGE GAUZE 2X2 STER 10/PKG (GAUZE/BANDAGES/DRESSINGS)
SUCTION FRAZIER TIP 10 FR DISP (SUCTIONS) ×2 IMPLANT
SUT MNCRL AB 4-0 PS2 18 (SUTURE) ×2 IMPLANT
SUT VIC AB 1 CT1 36 (SUTURE) ×2 IMPLANT
SUT VIC AB 2-0 CT1 27 (SUTURE) ×2
SUT VIC AB 2-0 CT1 TAPERPNT 27 (SUTURE) ×2 IMPLANT
SUT VLOC 180 0 24IN GS25 (SUTURE) ×2 IMPLANT
SYR 50ML LL SCALE MARK (SYRINGE) ×2 IMPLANT
TOWEL OR 17X26 10 PK STRL BLUE (TOWEL DISPOSABLE) ×2 IMPLANT
TOWEL OR NON WOVEN STRL DISP B (DISPOSABLE) ×2 IMPLANT
TRAY FOLEY CATH 14FRSI W/METER (CATHETERS) IMPLANT

## 2014-09-14 NOTE — Interval H&P Note (Signed)
History and Physical Interval Note:  09/14/2014 10:01 AM  Louis Kemp  has presented today for surgery, with the diagnosis of LEFT MEDIAL COMPARTMENTAL OA   The various methods of treatment have been discussed with the patient and family. After consideration of risks, benefits and other options for treatment, the patient has consented to  Procedure(s): LEFT UNICOMPARTMENTAL KNEE ARTHROPLASTY MEDIALLY (Left) as a surgical intervention .  The patient's history has been reviewed, patient examined, no change in status, stable for surgery.  I have reviewed the patient's chart and labs.  Questions were answered to the patient's satisfaction.     Mauri Pole

## 2014-09-14 NOTE — Evaluation (Addendum)
Physical Therapy Evaluation Patient Details Name: Louis Kemp MRN: [id number] DOB: [birth year] Today's Date: 09/14/2014   History of Present Illness  L UKR; PMHx: R TKA, DM  Clinical Impression  Pt will benefit from PT to address deficits below; Will complete stair training and HEP prior to D/C in am; He is doing very well at time of this eval    Follow Up Recommendations Home health PT    Equipment Recommendations  None recommended by PT    Recommendations for Other Services       Precautions / Restrictions Precautions Precautions: Knee Restrictions Other Position/Activity Restrictions: WBAT      Mobility  Bed Mobility Overal bed mobility: Needs Assistance Bed Mobility: Supine to Sit     Supine to sit: Supervision        Transfers Overall transfer level: Needs assistance Equipment used: Rolling walker (2 wheeled) Transfers: Sit to/from Stand Sit to Stand: Supervision;Min guard         General transfer comment: cues for hand placement  Ambulation/Gait Ambulation/Gait assistance: Min guard Ambulation Distance (Feet): 90 Feet Assistive device: Rolling walker (2 wheeled) Gait Pattern/deviations: Step-through pattern;Antalgic     General Gait Details: pt initally with heavy use of UEs, reported feeling "shaky d/t meds, improved with distance;  Stairs            Wheelchair Mobility    Modified Rankin (Stroke Patients Only)       Balance                                             Pertinent Vitals/Pain Pain Assessment: 0-10 Pain Score: 2  Pain Location: L knee Pain Intervention(s): Monitored during session;Limited activity within patient's tolerance;Premedicated before session;Ice applied    Home Living Family/patient expects to be discharged to:: Private residence Living Arrangements: Spouse/significant other Available Help at Discharge: Available 24 hours/day Type of Home: House Home Access: Stairs to enter    [employer] of Steps: 3 Home Layout: One level Home Equipment: [place] - 2 wheels;Bedside commode;Cane - single point      Prior Function Level of Independence: Independent               Hand Dominance        Extremity/Trunk Assessment   Upper Extremity Assessment: Overall WFL for tasks assessed           Lower Extremity Assessment: LLE deficits/detail   LLE Deficits / Details: strength grossly 3+/5; knee flexion 122*  Cervical / Trunk Assessment: Normal  Communication   Communication: No difficulties  Cognition Arousal/Alertness: Awake/alert Behavior During Therapy: WFL for tasks assessed/performed Overall Cognitive Status: Within Functional Limits for tasks assessed                      General Comments    Exercises Total Joint Exercises Ankle Circles/Pumps: AROM;Both;10 reps Quad Sets: AROM;Both;10 reps Heel Slides: AROM;Left;10 reps Goniometric ROM: 122* flexion      Assessment/Plan    PT Assessment Patient needs continued PT services  PT Diagnosis Difficulty walking   PT Problem List Decreased activity tolerance;Decreased balance;Decreased mobility;Decreased knowledge of use of DME  PT Treatment Interventions DME instruction;Gait training;Stair training;Patient/family education;Therapeutic exercise   PT Goals (Current goals can be found in the Care Plan section) Acute Rehab PT Goals Patient Stated Goal: return to I with less pain PT Goal Formulation:  With patient Time For Goal Achievement: 09/16/14 Potential to Achieve Goals: Good    Frequency 7X/week   Barriers to discharge        Co-evaluation               End of Session Equipment Utilized During Treatment: Gait belt Activity Tolerance: Patient tolerated treatment well Patient left: in chair;with call bell/phone within reach;with family/visitor present           Time:  -      Charges:         PT G CodesKenyon Ana 2014-09-23,  9:41 PM

## 2014-09-14 NOTE — Anesthesia Postprocedure Evaluation (Signed)
  Anesthesia Post-op Note  Patient: Louis Kemp  Procedure(s) Performed: Procedure(s) (LRB): LEFT UNICOMPARTMENTAL KNEE ARTHROPLASTY MEDIALLY (Left)  Patient Location: PACU  Anesthesia Type: Spinal  Level of Consciousness: awake and alert   Airway and Oxygen Therapy: Patient Spontanous Breathing  Post-op Pain: mild  Post-op Assessment: Post-op Vital signs reviewed, Patient's Cardiovascular Status Stable, Respiratory Function Stable, Patent Airway and No signs of Nausea or vomiting  Last Vitals:  Filed Vitals:   09/14/14 1241  BP: 112/60  Pulse: 62  Temp: 36.7 C  Resp: 15    Post-op Vital Signs: stable   Complications: No apparent anesthesia complications

## 2014-09-14 NOTE — Anesthesia Preprocedure Evaluation (Addendum)
Anesthesia Evaluation  Patient identified by MRN, date of birth, ID band Patient awake    Reviewed: Allergy & Precautions, H&P , NPO status , Patient's Chart, lab work & pertinent test results  Airway Mallampati: II TM Distance: >3 FB Neck ROM: Full    Dental  (+) Dental Advisory Given   Pulmonary Current Smoker,  breath sounds clear to auscultation        Cardiovascular hypertension, Pt. on medications + Valvular Problems/Murmurs Rhythm:Regular Rate:Normal     Neuro/Psych negative neurological ROS  negative psych ROS   GI/Hepatic negative GI ROS, Neg liver ROS,   Endo/Other  negative endocrine ROSdiabetes, Type 2, Oral Hypoglycemic Agents  Renal/GU negative Renal ROS     Musculoskeletal negative musculoskeletal ROS (+)   Abdominal   Peds  Hematology negative hematology ROS (+)   Anesthesia Other Findings   Reproductive/Obstetrics negative OB ROS                          Anesthesia Physical  Anesthesia Plan  ASA: III  Anesthesia Plan: Spinal   Post-op Pain Management:    Induction: Intravenous  Airway Management Planned:   Additional Equipment:   Intra-op Plan:   Post-operative Plan:   Informed Consent: I have reviewed the patients History and Physical, chart, labs and discussed the procedure including the risks, benefits and alternatives for the proposed anesthesia with the patient or authorized representative who has indicated his/her understanding and acceptance.   Dental advisory given  Plan Discussed with: CRNA  Anesthesia Plan Comments:         Anesthesia Quick Evaluation

## 2014-09-14 NOTE — Transfer of Care (Signed)
Immediate Anesthesia Transfer of Care Note  Patient: Louis Kemp  Procedure(s) Performed: Procedure(s): LEFT UNICOMPARTMENTAL KNEE ARTHROPLASTY MEDIALLY (Left)  Patient Location: PACU  Anesthesia Type:Regional  Level of Consciousness: awake, alert  and oriented  Airway & Oxygen Therapy: Patient Spontanous Breathing and Patient connected to face mask oxygen  Post-op Assessment: Report given to PACU RN and Post -op Vital signs reviewed and stable  Post vital signs: Reviewed and stable  Complications: No apparent anesthesia complications

## 2014-09-14 NOTE — Anesthesia Procedure Notes (Signed)
Spinal  Patient location during procedure: OR End time: 09/14/2014 11:05 AM Staffing Anesthesiologist: Montez Hageman Performed by: anesthesiologist  Preanesthetic Checklist Completed: patient identified, site marked, surgical consent, pre-op evaluation, timeout performed, IV checked, risks and benefits discussed and monitors and equipment checked Spinal Block Patient position: sitting Prep: Betadine Patient monitoring: heart rate, continuous pulse ox and blood pressure Approach: left paramedian Location: L3-4 Injection technique: single-shot Needle Needle type: Spinocan  Needle gauge: 22 G Needle length: 9 cm Assessment Sensory level: T6 Additional Notes Expiration date of kit checked and confirmed. Patient tolerated procedure well, without complications.

## 2014-09-14 NOTE — Op Note (Signed)
NAME: Louis Kemp    MEDICAL RECORD NO.: [id number]   FACILITY: [facility] OF BIRTH: 09-Dec-1941  PHYSICIAN: Pietro Cassis. Alvan Dame, M.D.    DATE OF PROCEDURE: 09/14/2014    OPERATIVE REPORT   PREOPERATIVE DIAGNOSIS: Left knee medial compartment osteoarthritis.   POSTOPERATIVE DIAGNOSIS: Left knee medial compartment osteoarthritis.  PROCEDURE: Left partial knee replacement utilizing Biomet Oxford knee  component, size small femur, a left medial size AA tibial tray with a size 4 mm insert.   SURGEON: Pietro Cassis. Alvan Dame, M.D.   ASSISTANT: Danae Orleans, PAC.  Please note that Mr. Louis Kemp was present for the entirety of the case,  utilized for preoperative positioning, perioperative retractor  management, general facilitation of the case and primary wound closure.   ANESTHESIA: Spinal.   SPECIMENS: None.   COMPLICATIONS: None.  DRAINS: 1 medium HV   TOURNIQUET TIME: 31 minutes at 250 mmHg.   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  He had initially presented with bilateral knee issues and has successfully made it though a right total knee.  He is now presenting with primary complaints of pain on the medial side of their knee. Radiographs revealed advanced medial compartment arthritis with specifically an antero-medial wear pattern.  There was bone on bone changes noted with subchondral sclerosis and osteophytes present. The patient has had progressive problems failing to respond to conservative measures of medications, injections and activity modification. Risks of infection, DVT, component failure, need for future revision surgery were all discussed and reviewed.  Consent was obtained for benefit of pain relief.   PROCEDURE IN DETAIL: The patient was brought to the operative theater.  Once adequate anesthesia, preoperative antibiotics, 2gm Ancef administered, the patient was positioned in supine position with a  left thigh tourniquet  placed. The left lower extremity was prepped and draped in sterile  fashion with the leg on the Oxford leg holder.  The leg was allowed to flex to 120 degrees. A time-out  was performed identifying the patient, planned procedure, and extremity.  The leg was exsanguinated, tourniquet elevated to 250 mmHg. A midline  incision was made from the proximal pole of the patella to the tibial tubercle. A  soft tissue plane was created and partial median arthrotomy was then  made to allow for subluxation of the patella. Following initial synovectomy and  debridement, the osteophytes were removed off the medial aspect of the  knee.   Attention was first directed to the tibia. The tibial  extramedullary guide was positioned over the anterior crest of the tibia  and pinned into position, and using a measured resection guide from the  [place] system, a 4 mm resection was made off the proximal tibia. First  the reciprocating saw along the medial aspect of the tibial spines, then the oscillating saw.    At this point, I sized this cut surface seem to be best fit for a size AA tibial tray.  With the retractors out of the wound and the knee held at 90 degrees the size 4 feeler gauge had appropriate tension on the medial ligament.   At this point, the femoral canal was opened with a drill and the  intramedullary rod passed. Then using the guide for a small posterior resection off  the posterior aspect of the femur was positioned over the mid portion of the medial femoral condyle.  The orientation was set using the guide that mates  the femoral guide to the intramedullary rod.  The 2 drill holes were made into the distal femur.  The posterior guide was then impacted into place and the posterior  femoral cut made.  At this point, I milled the distal femur with a size 4 spigot in place. At this point, we did a trial reduction of the small femur, size AA tibial tray and a size 4 feeler gauge.  At 20 degrees of flexion the tension on the MCL felt more symmetric with the 3 feeler gauge compared to the 4 at 90 degrees.  Given the difference in the tension between the knee in 90 degrees versus that in 20 degrees I had to place the 5 spigot into the femur and re-mill the distal femur.  Remaining bone was removed and debrided.  I repeated the trial reduction and found that now at both 90 degrees and 20 degrees the knee ligament were tension symmetrically with the 4 feeler gauge.  Given these findings, the trial femoral component was removed. Final preparation of tibia was carried out by pinning it in position. Then  using a reciprocating saw I removed bone for the keel. Further bone was  removed with an osteotome.  Trial reduction was now carried out with the small femur, the left medial AA tibia, and the size 4 lollipop insert. The balance of the  ligaments appeared to be symmetric at 20 degrees and 90 degrees. Given  all these findings, the trial components were removed.   Cement was mixed. The final components were opened. The knee was irrigated with  normal saline solution. Then final debridements of the  soft tissue was carried out, I also drilled the sclerotic bone with a drill.  The final components were cemented with a single batch of cement in a  two-stage technique with the tibial component cemented first. The knee  was then brought  to 45 degrees of flexion with a 4 feeler gauge, held with pressure for a minute and half.  After this the femoral component was cemented in place.  The knee was again held at 45 degrees of flexion while the cement fully cured.  Excess cement was removed throughout the knee. Tourniquet was let down  after 31 minutes. After the cement had fully cured and excessive cement  was removed throughout the knee there was no visualized cement present.   The final left medial size 4 insert to match the small femur was chosen and snapped into position. We  re-irrigated  the knee. The extensor mechanism  was then reapproximated using a #1 Vicryl and #0 V-lock with the knee in flexion. The  remaining wound was closed with 2-0 Vicryl and a running 4-0 Monocryl.  The knee was cleaned, dried, and dressed sterilely using Dermabond and  Aquacel dressing. The patient  was brought to the recovery room, Ace wrap in place, tolerating the  procedure well. He will be in the hospital for overnight observation.  We will initiate physical therapy and progress to ambulate.     Pietro Cassis Alvan Dame, M.D.

## 2014-09-15 DIAGNOSIS — M1710 Unilateral primary osteoarthritis, unspecified knee: Secondary | ICD-10-CM | POA: Diagnosis not present

## 2014-09-15 DIAGNOSIS — Z96652 Presence of left artificial knee joint: Secondary | ICD-10-CM

## 2014-09-15 LAB — CBC
HCT: 37.2 % — ABNORMAL LOW (ref 39.0–52.0)
Hemoglobin: 12.6 g/dL — ABNORMAL LOW (ref 13.0–17.0)
MCH: 29.6 pg (ref 26.0–34.0)
MCHC: 33.9 g/dL (ref 30.0–36.0)
MCV: 87.5 fL (ref 78.0–100.0)
Platelets: 298 10*3/uL (ref 150–400)
RBC: 4.25 MIL/uL (ref 4.22–5.81)
RDW: 13.2 % (ref 11.5–15.5)
WBC: 14.7 10*3/uL — ABNORMAL HIGH (ref 4.0–10.5)

## 2014-09-15 LAB — BASIC METABOLIC PANEL
Anion gap: 10 (ref 5–15)
BUN: 23 mg/dL (ref 6–23)
CO2: 23 mEq/L (ref 19–32)
Calcium: 9 mg/dL (ref 8.4–10.5)
Chloride: 106 mEq/L (ref 96–112)
Creatinine, Ser: 1.37 mg/dL — ABNORMAL HIGH (ref 0.50–1.35)
GFR calc Af Amer: 58 mL/min — ABNORMAL LOW (ref >90)
GFR calc non Af Amer: 50 mL/min — ABNORMAL LOW (ref >90)
Glucose, Bld: 154 mg/dL — ABNORMAL HIGH (ref 70–99)
Potassium: 4.8 mEq/L (ref 3.7–5.3)
Sodium: 139 mEq/L (ref 137–147)

## 2014-09-15 LAB — GLUCOSE, CAPILLARY
Glucose-Capillary: 133 mg/dL — ABNORMAL HIGH (ref 70–99)
Glucose-Capillary: 182 mg/dL — ABNORMAL HIGH (ref 70–99)

## 2014-09-15 MED ORDER — FERROUS SULFATE 325 (65 FE) MG PO TABS: 325.0000 mg | ORAL_TABLET | Freq: Three times a day (TID) | ORAL | Status: DC

## 2014-09-15 MED ORDER — POLYETHYLENE GLYCOL 3350 17 G PO PACK
17.0000 g | PACK | Freq: Two times a day (BID) | ORAL | Status: DC
Start: 2014-09-15 — End: 2015-02-23

## 2014-09-15 MED ORDER — DSS 100 MG PO CAPS: 100.0000 mg | ORAL_CAPSULE | Freq: Two times a day (BID) | ORAL | Status: DC

## 2014-09-15 MED ORDER — ASPIRIN 325 MG PO TBEC: 325.0000 mg | DELAYED_RELEASE_TABLET | Freq: Two times a day (BID) | ORAL | Status: DC

## 2014-09-15 MED ORDER — METHOCARBAMOL 500 MG PO TABS: 500.0000 mg | ORAL_TABLET | Freq: Four times a day (QID) | ORAL | Status: DC | PRN

## 2014-09-15 MED ORDER — HYDROCODONE-ACETAMINOPHEN 7.5-325 MG PO TABS: 1.0000 | ORAL_TABLET | ORAL | Status: DC | PRN

## 2014-09-15 NOTE — Progress Notes (Signed)
[facility] is providing the following services: received orders for rw and commode - patient has both at home already from previous surgery.  No DME needs at this time.   If patient discharges after hours, please call [phone].   Linward Headland 09/15/2014, 8:47 AM

## 2014-09-15 NOTE — Progress Notes (Signed)
Patient ID: Louis Kemp, male   DOB: 1941/02/10, 73 y.o.   MRN: [id number] Subjective: 1 Day Post-Op Procedure(s) (LRB): LEFT UNICOMPARTMENTAL KNEE ARTHROPLASTY MEDIALLY (Left)    Patient reports pain as mild.  Feels less pain with this partial than with right total as to be expected  Objective:   VITALS:   Filed Vitals:   09/15/14 0615  BP: 159/82  Pulse: 51  Temp: 97.8 F (36.6 C)  Resp: 16    Neurovascular intact Incision: dressing C/D/I  Already flexed to 115 degrees  LABS  Recent Labs  09/15/14 0438  HGB 12.6*  HCT 37.2*  WBC 14.7*  PLT 298     Recent Labs  09/15/14 0438  NA 139  K 4.8  BUN 23  CREATININE 1.37*  GLUCOSE 154*    No results found for this basename: LABPT, INR,  in the last 72 hours   Assessment/Plan: 1 Day Post-Op Procedure(s) (LRB): LEFT UNICOMPARTMENTAL KNEE ARTHROPLASTY MEDIALLY (Left)   Advance diet Up with therapy Discharge home with home health today  RTC in 2 weeks

## 2014-09-15 NOTE — Progress Notes (Signed)
09/14/14 1716  PT Time Calculation  PT Start Time 1652  PT Stop Time 1712  PT Time Calculation (min) 20 min  PT G-Codes **NOT FOR INPATIENT CLASS**  Functional Assessment Tool Used clinical observation  Functional Limitation Mobility: Walking and moving around  Mobility: Walking and Moving Around Current Status (X5217) CI  Mobility: Walking and Moving Around Goal Status (G7159) CH  PT General Charges  $$ ACUTE PT VISIT 1 Procedure  PT Evaluation

## 2014-09-15 NOTE — Progress Notes (Signed)
OT Cancellation Note  Patient Details Name: Louis Kemp MRN: [id number] DOB: 16-May-1941   Cancelled Treatment:    Reason Eval/Treat Not Completed: OT screened, no needs identified, will sign off  Per PT pt does not need OT  Services. Thanks,   Betsy Pries 09/15/2014, 11:17 AM

## 2014-09-15 NOTE — Discharge Summary (Signed)
Physician Discharge Summary  Patient ID: Louis Kemp MRN: [id number] DOB/AGE: [birth year] 73 y.o.  Admit date: 09/14/2014 Discharge date: 09/15/2014   Procedures:  Procedure(s) (LRB): LEFT UNICOMPARTMENTAL KNEE ARTHROPLASTY MEDIALLY (Left)  Attending Physician:  Dr. Paralee Cancel   Admission Diagnoses:   Left knee medial compartmental OA / pain  Discharge Diagnoses:  Principal Problem:   S/P left UKR Active Problems:   Status post left partial knee replacement  Past Medical History  Diagnosis Date  . BPH (benign prostatic hypertrophy)   . Hyperlipidemia   . Statin intolerance   . LBP (low back pain)   . Osteoarthritis   . MVP (mitral valve prolapse)   . Vitiligo   . Vitamin D deficiency   . Vitamin B 12 deficiency 2011  . Type II or unspecified type diabetes mellitus without mention of complication, not stated as uncontrolled 2011  . Hypertension   . PAC (premature atrial contraction)     HPI: Louis Kemp, 73 y.o. male, has a history of pain and functional disability in the left knee due to arthritis and has failed non-surgical conservative treatments for greater than 12 weeks to include NSAID's and/or analgesics, corticosteriod injections and activity modification. Onset of symptoms was gradual, starting 1+ years ago with gradually worsening course since that time. The patient noted prior procedures on the knee to include arthroplasty on the right knee(s). Patient currently rates pain in the left knee(s) at 6 out of 10 with activity. Patient has worsening of pain with activity and weight bearing, pain that interferes with activities of daily living, pain with passive range of motion, crepitus and joint swelling. Patient has evidence of periarticular osteophytes and joint space narrowing of the medial compartment by imaging studies. There is no active infection. Risks, benefits and expectations were discussed with the patient. Risks including but not limited to the risk of  anesthesia, blood clots, nerve damage, blood vessel damage, failure of the prosthesis, infection and up to and including death. Patient understand the risks, benefits and expectations and wishes to proceed with surgery.  PCP: Walker Kehr, MD   Discharged Condition: good  Hospital Course:  Patient underwent the above stated procedure on 09/14/2014. Patient tolerated the procedure well and brought to the recovery room in good condition and subsequently to the floor.  POD #1 BP: 159/82 ; Pulse: 51 ; Temp: 97.8 F (36.6 C) ; Resp: 16  Patient reports pain as mild. Feels less pain with this partial than with right total as to be expected. Ready to be discharged home. Dorsiflexion/plantar flexion intact, incision: dressing C/D/I, no cellulitis present and compartment soft. Already flexed to 115 degrees.  LABS  Basename    HGB  12.6  HCT  37.2    Discharge Exam: General appearance: alert, cooperative and no distress Extremities: Homans sign is negative, no sign of DVT, no edema, redness or tenderness in the calves or thighs and no ulcers, gangrene or trophic changes  Disposition: Home with follow up in 2 weeks   Follow-up Information   Follow up with Mauri Pole, MD. Schedule an appointment as soon as possible for a visit in 2 weeks.   Specialty:  Orthopedic Surgery   Contact information:   [address] [place] [address] [phone]       Discharge Instructions   Call MD / Call 911    Complete by:  As directed   If you experience chest pain or shortness of breath, CALL 911 and be transported  to the hospital emergency room.  If you develope a fever above 101 F, pus (white drainage) or increased drainage or redness at the wound, or calf pain, call your surgeon's office.     Change dressing    Complete by:  As directed   Maintain surgical dressing for 10-14 days, or until follow up in the clinic.     Constipation Prevention    Complete by:  As directed    Drink plenty of fluids.  Prune juice may be helpful.  You may use a stool softener, such as Colace (over the counter) 100 mg twice a day.  Use MiraLax (over the counter) for constipation as needed.     Diet - low sodium heart healthy    Complete by:  As directed      Discharge instructions    Complete by:  As directed   Maintain surgical dressing for 10-14 days, or until follow up in the clinic. Follow up in 2 weeks at [facility]. Call with any questions or concerns.     Increase activity slowly as tolerated    Complete by:  As directed      TED hose    Complete by:  As directed   Use stockings (TED hose) for 2 weeks on both leg(s).  You may remove them at night for sleeping.     Weight bearing as tolerated    Complete by:  As directed   Laterality:  left  Extremity:  Lower             Medication List    STOP taking these medications       acetaminophen 500 MG tablet  Commonly known as:  TYLENOL     naproxen sodium 220 MG tablet  Commonly known as:  ANAPROX      TAKE these medications       aspirin 325 MG EC tablet  Take 1 tablet (325 mg total) by mouth 2 (two) times daily.     cyanocobalamin 1000 MCG/ML injection  Commonly known as:  (VITAMIN B-12)  Inject 1,000 mcg into the muscle every 14 (fourteen) days.     DSS 100 MG Caps  Take 100 mg by mouth 2 (two) times daily.     fenofibrate 145 MG tablet  Commonly known as:  TRICOR  Take 145 mg by mouth every morning.     ferrous sulfate 325 (65 FE) MG tablet  Take 1 tablet (325 mg total) by mouth 3 (three) times daily after meals.     HYDROcodone-acetaminophen 7.5-325 MG per tablet  Commonly known as:  NORCO  Take 1-2 tablets by mouth every 4 (four) hours as needed for moderate pain.     losartan 100 MG tablet  Commonly known as:  COZAAR  Take 100 mg by mouth every morning.     metFORMIN 500 MG tablet  Commonly known as:  GLUCOPHAGE  Take 500 mg by mouth 2 (two) times daily with a meal.      methocarbamol 500 MG tablet  Commonly known as:  ROBAXIN  Take 1 tablet (500 mg total) by mouth every 6 (six) hours as needed for muscle spasms.     nebivolol 10 MG tablet  Commonly known as:  BYSTOLIC  Take 10 mg by mouth every morning.     polyethylene glycol packet  Commonly known as:  MIRALAX / GLYCOLAX  Take 17 g by mouth 2 (two) times daily.     terazosin 2 MG capsule  Commonly known as:  HYTRIN  Take 2 mg by mouth 2 (two) times daily.     Vitamin D 2000 UNITS Caps  Take 1 capsule by mouth every morning.         Signed: West Pugh. Babish   PA-C  09/15/2014, 2:08 PM

## 2014-09-15 NOTE — Progress Notes (Signed)
Physical Therapy Treatment Patient Details Name: Louis Kemp MRN: [id number] DOB: Jan 22, 1941 Today's Date: 18-Sep-2014    History of Present Illness L UKR; PMHx: R TKA, DM    PT Comments    Pt doing very well this am; Feels ready to D/C home and ok from PT standpoint;   Follow Up Recommendations  Home health PT     Equipment Recommendations  None recommended by PT    Recommendations for Other Services       Precautions / Restrictions Precautions Precautions: Knee    Mobility  Bed Mobility Overal bed mobility: Needs Assistance Bed Mobility: Supine to Sit     Supine to sit: Modified independent (Device/Increase time)        Transfers Overall transfer level: Needs assistance Equipment used: Rolling walker (2 wheeled) Transfers: Sit to/from Stand Sit to Stand: Supervision         General transfer comment: cues for hand placement  Ambulation/Gait Ambulation/Gait assistance: Supervision;Modified independent (Device/Increase time) Ambulation Distance (Feet): 300 Feet Assistive device: Rolling walker (2 wheeled) Gait Pattern/deviations: Step-through pattern     General Gait Details: cues for heel to toe and knee flexion during swing phase on LLE   Stairs Stairs: Yes Stairs assistance: Supervision Stair Management: One rail Right;Forwards;Step to pattern;Alternating pattern Number of Stairs: 4 General stair comments: cues for sequence, pt did 2 steps with reciprocal pattern per his request, did very well  Wheelchair Mobility    Modified Rankin (Stroke Patients Only)       Balance                                    Cognition Arousal/Alertness: Awake/alert Behavior During Therapy: WFL for tasks assessed/performed Overall Cognitive Status: Within Functional Limits for tasks assessed                      Exercises Total Joint Exercises Ankle Circles/Pumps: AROM;Both;10 reps Quad Sets: AROM;Both;15 reps Heel Slides:  AROM;Left;10 reps Hip ABduction/ADduction: AROM;Left;15 reps Straight Leg Raises: AROM;Left;10 reps Knee Flexion: AROM;AAROM;Left;5 reps Goniometric ROM: -2 to 121* flexion    General Comments        Pertinent Vitals/Pain Pain Assessment: 0-10 Pain Score: 3  Pain Location: L knee Pain Intervention(s): Premedicated before session;Ice applied    Home Living                      Prior Function            PT Goals (current goals can now be found in the care plan section) Acute Rehab PT Goals Patient Stated Goal: return to I with less pain Time For Goal Achievement: 09/16/14 Potential to Achieve Goals: Good Progress towards PT goals: Progressing toward goals    Frequency  7X/week    PT Plan Current plan remains appropriate    Co-evaluation             End of Session Equipment Utilized During Treatment: Gait belt Activity Tolerance: Patient tolerated treatment well Patient left: in chair;with call bell/phone within reach     Time: 0825-0901 PT Time Calculation (min): 36 min  Charges:  $Gait Training: 8-22 mins $Therapeutic Exercise: 8-22 mins                    G Codes:      WILLIAMS,TARA September 18, 2014, 9:04 AM

## 2014-09-16 NOTE — Progress Notes (Signed)
CARE MANAGEMENT NOTE 09/16/2014  Patient:  IDAN, PRIME   Account Number:  [id number]  Date Initiated:  09/16/2014  Documentation initiated by:  DAVIS,RHONDA  Subjective/Objective Assessment:   left knee revision     Action/Plan:   home with hhc   Anticipated DC Date:  09/15/2014   Anticipated DC Plan:  [place] referral  NA      DC Planning Services  CM consult      [facility] Choice  NA   Choice offered to / List presented to:  C-1 Patient   DME arranged  NA      DME agency  NA     [place] arranged  HH-2 PT      [place]   Status of service:  Completed, signed off Medicare Important Message given?   (If response is "NO", the following Medicare IM given date fields will be blank) Date Medicare IM given:   Medicare IM given by:   Date Additional Medicare IM given:   Additional Medicare IM given by:    Discharge Disposition:  [place]  Per UR Regulation:  Reviewed for med. necessity/level of care/duration of stay  If discussed at [place] of Stay Meetings, dates discussed:    Comments:  Suanne Marker Davis,RN,BSN,CCM

## 2014-09-25 ENCOUNTER — Ambulatory Visit: Payer: Medicare Other | Admitting: Internal Medicine

## 2014-09-30 ENCOUNTER — Encounter: Payer: Self-pay | Admitting: Internal Medicine

## 2014-09-30 ENCOUNTER — Other Ambulatory Visit (INDEPENDENT_AMBULATORY_CARE_PROVIDER_SITE_OTHER): Payer: Medicare Other

## 2014-09-30 ENCOUNTER — Ambulatory Visit (INDEPENDENT_AMBULATORY_CARE_PROVIDER_SITE_OTHER): Payer: Medicare Other | Admitting: Internal Medicine

## 2014-09-30 VITALS — BP 120/70 | HR 60 | Temp 97.6°F | Resp 18 | Wt 148.0 lb

## 2014-09-30 DIAGNOSIS — E119 Type 2 diabetes mellitus without complications: Secondary | ICD-10-CM | POA: Diagnosis not present

## 2014-09-30 DIAGNOSIS — Z96651 Presence of right artificial knee joint: Secondary | ICD-10-CM | POA: Diagnosis not present

## 2014-09-30 DIAGNOSIS — Z23 Encounter for immunization: Secondary | ICD-10-CM

## 2014-09-30 DIAGNOSIS — I10 Essential (primary) hypertension: Secondary | ICD-10-CM

## 2014-09-30 DIAGNOSIS — E559 Vitamin D deficiency, unspecified: Secondary | ICD-10-CM

## 2014-09-30 DIAGNOSIS — R634 Abnormal weight loss: Secondary | ICD-10-CM

## 2014-09-30 DIAGNOSIS — Z96652 Presence of left artificial knee joint: Secondary | ICD-10-CM

## 2014-09-30 DIAGNOSIS — D62 Acute posthemorrhagic anemia: Secondary | ICD-10-CM

## 2014-09-30 LAB — IBC PANEL
Iron: 76 ug/dL (ref 42–165)
Saturation Ratios: 20 % (ref 20.0–50.0)
Transferrin: 272 mg/dL (ref 212.0–360.0)

## 2014-09-30 LAB — BASIC METABOLIC PANEL
BUN: 17 mg/dL (ref 6–23)
CO2: 22 mEq/L (ref 19–32)
Calcium: 9.6 mg/dL (ref 8.4–10.5)
Chloride: 106 mEq/L (ref 96–112)
Creatinine, Ser: 1.4 mg/dL (ref 0.4–1.5)
GFR: 53.76 mL/min — ABNORMAL LOW (ref >60.00)
Glucose, Bld: 109 mg/dL — ABNORMAL HIGH (ref 70–99)
Potassium: 4.1 mEq/L (ref 3.5–5.1)
Sodium: 141 mEq/L (ref 135–145)

## 2014-09-30 LAB — HEPATIC FUNCTION PANEL
ALT: 11 U/L (ref 0–53)
AST: 21 U/L (ref 0–37)
Albumin: 3.5 g/dL (ref 3.5–5.2)
Alkaline Phosphatase: 64 U/L (ref 39–117)
Bilirubin, Direct: 0.1 mg/dL (ref 0.0–0.3)
Total Bilirubin: 0.4 mg/dL (ref 0.2–1.2)
Total Protein: 7.7 g/dL (ref 6.0–8.3)

## 2014-09-30 LAB — HEMOGLOBIN A1C: Hgb A1c MFr Bld: 6.3 % (ref 4.6–6.5)

## 2014-09-30 MED ORDER — CARVEDILOL 12.5 MG PO TABS: 12.5000 mg | ORAL_TABLET | Freq: Two times a day (BID) | ORAL | Status: DC

## 2014-09-30 NOTE — Assessment & Plan Note (Signed)
Continue with current prescription therapy as reflected on the Med list.  

## 2014-09-30 NOTE — Assessment & Plan Note (Signed)
Wt Readings from Last 3 Encounters:  09/30/14 148 lb (67.132 kg)  09/14/14 148 lb (67.132 kg)  09/14/14 148 lb (67.132 kg)

## 2014-09-30 NOTE — Assessment & Plan Note (Signed)
Summer 2015

## 2014-09-30 NOTE — Progress Notes (Signed)
Pre visit review using our clinic review tool, if applicable. No additional management support is needed unless otherwise documented below in the visit note. 

## 2014-09-30 NOTE — Assessment & Plan Note (Signed)
Fall 2015 Dr Alvan Dame

## 2014-09-30 NOTE — Progress Notes (Signed)
   Subjective:    HPI    The patient presents for a follow-up of  Chronic vit D and B12 def, chronic dyslipidemia, type 2 diabetes controlled with medicines, neuropathy sx's. He retired in 4/13. F/u wt loss and no appetite. He saw Dr Collene Mares.  F/u knee OA - s/p B THR by Dr Alvan Dame - recovering C/o Bystolic is too $$$  BP is nl at home  BP Readings from Last 3 Encounters:  09/30/14 120/70  09/15/14 138/63  09/15/14 138/63   Wt Readings from Last 3 Encounters:  09/30/14 148 lb (67.132 kg)  09/14/14 148 lb (67.132 kg)  09/14/14 148 lb (67.132 kg)      Review of Systems  Constitutional: Negative for appetite change, fatigue and unexpected weight change.  HENT: Negative for congestion, nosebleeds, sneezing, sore throat and trouble swallowing.   Eyes: Negative for itching and visual disturbance.  Cardiovascular: Negative for leg swelling.  Gastrointestinal: Negative for diarrhea, blood in stool and abdominal distention.  Genitourinary: Negative for frequency and hematuria.  Musculoskeletal: Negative for back pain, gait problem, joint swelling and neck pain.  Skin: Negative for rash.  Neurological: Negative for tremors and speech difficulty.  Psychiatric/Behavioral: Negative for sleep disturbance, dysphoric mood and agitation. The patient is not nervous/anxious.        Objective:   Physical Exam  Constitutional: He is oriented to person, place, and time. He appears well-developed.  HENT:  Mouth/Throat: Oropharynx is clear and moist.  Eyes: Conjunctivae are normal. Pupils are equal, round, and reactive to light.  Neck: Normal range of motion. No JVD present. No thyromegaly present.  Cardiovascular: Normal rate, regular rhythm, normal heart sounds and intact distal pulses.  Exam reveals no gallop and no friction rub.   No murmur heard. Pulmonary/Chest: Effort normal and breath sounds normal. No respiratory distress. He has no wheezes. He has no rales. He exhibits no tenderness.   Abdominal: Soft. Bowel sounds are normal. He exhibits no distension and no mass. There is no tenderness. There is no rebound and no guarding.  Musculoskeletal: Normal range of motion. He exhibits no edema and no tenderness.  Lymphadenopathy:    He has no cervical adenopathy.  Neurological: He is alert and oriented to person, place, and time. He has normal reflexes. No cranial nerve deficit. He exhibits normal muscle tone. Coordination normal.  Skin: Skin is warm and dry. No rash noted.  Psychiatric: He has a normal mood and affect. His behavior is normal. Judgment and thought content normal.  looks well R knee is tender w/ROM  Lab Results  Component Value Date   WBC 14.7* 09/15/2014   HGB 12.6* 09/15/2014   HCT 37.2* 09/15/2014   PLT 298 09/15/2014   GLUCOSE 154* 09/15/2014   CHOL 192 10/16/2012   TRIG 65 10/16/2012   HDL 53 10/16/2012   LDLCALC 126* 10/16/2012   ALT 21 10/16/2012   AST 25 10/16/2012   NA 139 09/15/2014   K 4.8 09/15/2014   CL 106 09/15/2014   CREATININE 1.37* 09/15/2014   BUN 23 09/15/2014   CO2 23 09/15/2014   TSH 1.35 11/24/2013   PSA 0.42 02/19/2013   INR 1.01 09/03/2014   HGBA1C 6.3 03/25/2014    abd CT     Assessment & Plan:

## 2014-10-01 ENCOUNTER — Telehealth: Payer: Self-pay | Admitting: Internal Medicine

## 2014-10-01 NOTE — Telephone Encounter (Signed)
emmi emailed °

## 2015-01-26 ENCOUNTER — Other Ambulatory Visit: Payer: Self-pay | Admitting: Internal Medicine

## 2015-02-01 ENCOUNTER — Ambulatory Visit: Payer: Medicare Other | Admitting: Internal Medicine

## 2015-02-23 ENCOUNTER — Ambulatory Visit (INDEPENDENT_AMBULATORY_CARE_PROVIDER_SITE_OTHER): Payer: Medicare Other | Admitting: Internal Medicine

## 2015-02-23 ENCOUNTER — Other Ambulatory Visit (INDEPENDENT_AMBULATORY_CARE_PROVIDER_SITE_OTHER): Payer: Medicare Other

## 2015-02-23 ENCOUNTER — Encounter: Payer: Self-pay | Admitting: Internal Medicine

## 2015-02-23 VITALS — BP 130/70 | HR 64 | Temp 98.8°F | Wt 148.0 lb

## 2015-02-23 DIAGNOSIS — E559 Vitamin D deficiency, unspecified: Secondary | ICD-10-CM

## 2015-02-23 DIAGNOSIS — E119 Type 2 diabetes mellitus without complications: Secondary | ICD-10-CM | POA: Diagnosis not present

## 2015-02-23 DIAGNOSIS — R634 Abnormal weight loss: Secondary | ICD-10-CM | POA: Diagnosis not present

## 2015-02-23 DIAGNOSIS — E538 Deficiency of other specified B group vitamins: Secondary | ICD-10-CM

## 2015-02-23 LAB — BASIC METABOLIC PANEL
BUN: 17 mg/dL (ref 6–23)
CO2: 31 mEq/L (ref 19–32)
Calcium: 9.8 mg/dL (ref 8.4–10.5)
Chloride: 105 mEq/L (ref 96–112)
Creatinine, Ser: 1.59 mg/dL — ABNORMAL HIGH (ref 0.40–1.50)
GFR: 45.6 mL/min — ABNORMAL LOW (ref >60.00)
Glucose, Bld: 119 mg/dL — ABNORMAL HIGH (ref 70–99)
Potassium: 4.2 mEq/L (ref 3.5–5.1)
Sodium: 139 mEq/L (ref 135–145)

## 2015-02-23 LAB — VITAMIN B12: Vitamin B-12: 774 pg/mL (ref 211–911)

## 2015-02-23 LAB — LIPID PANEL
Cholesterol: 134 mg/dL (ref 0–200)
HDL: 37.8 mg/dL — ABNORMAL LOW (ref >39.00)
LDL Cholesterol: 82 mg/dL (ref 0–99)
NonHDL: 96.2
Total CHOL/HDL Ratio: 4
Triglycerides: 73 mg/dL (ref 0.0–149.0)
VLDL: 14.6 mg/dL (ref 0.0–40.0)

## 2015-02-23 LAB — HEPATIC FUNCTION PANEL
ALT: 20 U/L (ref 0–53)
AST: 24 U/L (ref 0–37)
Albumin: 4.2 g/dL (ref 3.5–5.2)
Alkaline Phosphatase: 63 U/L (ref 39–117)
Bilirubin, Direct: 0.2 mg/dL (ref 0.0–0.3)
Total Bilirubin: 0.6 mg/dL (ref 0.2–1.2)
Total Protein: 7.3 g/dL (ref 6.0–8.3)

## 2015-02-23 LAB — HEMOGLOBIN A1C: Hgb A1c MFr Bld: 6.6 % — ABNORMAL HIGH (ref 4.6–6.5)

## 2015-02-23 NOTE — Assessment & Plan Note (Signed)
Wt Readings from Last 3 Encounters:  02/23/15 148 lb (67.132 kg)  09/30/14 148 lb (67.132 kg)  09/14/14 148 lb (67.132 kg)

## 2015-02-23 NOTE — Progress Notes (Signed)
Pre visit review using our clinic review tool, if applicable. No additional management support is needed unless otherwise documented below in the visit note. 

## 2015-02-23 NOTE — Progress Notes (Signed)
   Subjective:    HPI    The patient presents for a follow-up of  Chronic vit D and B12 def, chronic dyslipidemia, type 2 diabetes controlled with medicines, neuropathy sx's. He retired in 4/13. F/u wt loss and no appetite. He saw Dr Collene Mares.  F/u knee OA - s/p B THR by Dr Alvan Dame - recovering C/o Bystolic is too $$$  BP is nl at home  BP Readings from Last 3 Encounters:  02/23/15 130/70  09/30/14 120/70  09/15/14 138/63   Wt Readings from Last 3 Encounters:  02/23/15 148 lb (67.132 kg)  09/30/14 148 lb (67.132 kg)  09/14/14 148 lb (67.132 kg)      Review of Systems  Constitutional: Negative for appetite change, fatigue and unexpected weight change.  HENT: Negative for congestion, nosebleeds, sneezing, sore throat and trouble swallowing.   Eyes: Negative for itching and visual disturbance.  Cardiovascular: Negative for leg swelling.  Gastrointestinal: Negative for diarrhea, blood in stool and abdominal distention.  Genitourinary: Negative for frequency and hematuria.  Musculoskeletal: Negative for back pain, joint swelling, gait problem and neck pain.  Skin: Negative for rash.  Neurological: Negative for tremors and speech difficulty.  Psychiatric/Behavioral: Negative for sleep disturbance, dysphoric mood and agitation. The patient is not nervous/anxious.        Objective:   Physical Exam  Constitutional: He is oriented to person, place, and time. He appears well-developed.  HENT:  Mouth/Throat: Oropharynx is clear and moist.  Eyes: Conjunctivae are normal. Pupils are equal, round, and reactive to light.  Neck: Normal range of motion. No JVD present. No thyromegaly present.  Cardiovascular: Normal rate, regular rhythm, normal heart sounds and intact distal pulses.  Exam reveals no gallop and no friction rub.   No murmur heard. Pulmonary/Chest: Effort normal and breath sounds normal. No respiratory distress. He has no wheezes. He has no rales. He exhibits no tenderness.   Abdominal: Soft. Bowel sounds are normal. He exhibits no distension and no mass. There is no tenderness. There is no rebound and no guarding.  Musculoskeletal: Normal range of motion. He exhibits no edema or tenderness.  Lymphadenopathy:    He has no cervical adenopathy.  Neurological: He is alert and oriented to person, place, and time. He has normal reflexes. No cranial nerve deficit. He exhibits normal muscle tone. Coordination normal.  Skin: Skin is warm and dry. No rash noted.  Psychiatric: He has a normal mood and affect. His behavior is normal. Judgment and thought content normal.  looks well R knee is tender w/ROM  Lab Results  Component Value Date   WBC 14.7* 09/15/2014   HGB 12.6* 09/15/2014   HCT 37.2* 09/15/2014   PLT 298 09/15/2014   GLUCOSE 109* 09/30/2014   CHOL 192 10/16/2012   TRIG 65 10/16/2012   HDL 53 10/16/2012   LDLCALC 126* 10/16/2012   ALT 11 09/30/2014   AST 21 09/30/2014   NA 141 09/30/2014   K 4.1 09/30/2014   CL 106 09/30/2014   CREATININE 1.4 09/30/2014   BUN 17 09/30/2014   CO2 22 09/30/2014   TSH 1.35 11/24/2013   PSA 0.42 02/19/2013   INR 1.01 09/03/2014   HGBA1C 6.3 09/30/2014    abd CT     Assessment & Plan:

## 2015-02-23 NOTE — Assessment & Plan Note (Signed)
Continue with current prescription therapy as reflected on the Med list.  

## 2015-02-23 NOTE — Assessment & Plan Note (Signed)
Chronic. 

## 2015-02-23 NOTE — Assessment & Plan Note (Signed)
Labs  Cont w/Metformin

## 2015-04-26 ENCOUNTER — Other Ambulatory Visit: Payer: Self-pay | Admitting: Internal Medicine

## 2015-05-14 DIAGNOSIS — Z0100 Encounter for examination of eyes and vision without abnormal findings: Secondary | ICD-10-CM | POA: Diagnosis not present

## 2015-06-25 ENCOUNTER — Encounter: Payer: Self-pay | Admitting: Internal Medicine

## 2015-06-25 ENCOUNTER — Ambulatory Visit (INDEPENDENT_AMBULATORY_CARE_PROVIDER_SITE_OTHER): Payer: Medicare Other | Admitting: Internal Medicine

## 2015-06-25 ENCOUNTER — Other Ambulatory Visit (INDEPENDENT_AMBULATORY_CARE_PROVIDER_SITE_OTHER): Payer: Medicare Other

## 2015-06-25 VITALS — BP 100/69 | HR 57 | Temp 97.9°F | Wt 152.0 lb

## 2015-06-25 DIAGNOSIS — E119 Type 2 diabetes mellitus without complications: Secondary | ICD-10-CM | POA: Diagnosis not present

## 2015-06-25 DIAGNOSIS — E559 Vitamin D deficiency, unspecified: Secondary | ICD-10-CM

## 2015-06-25 DIAGNOSIS — E538 Deficiency of other specified B group vitamins: Secondary | ICD-10-CM | POA: Diagnosis not present

## 2015-06-25 DIAGNOSIS — I10 Essential (primary) hypertension: Secondary | ICD-10-CM

## 2015-06-25 LAB — BASIC METABOLIC PANEL
BUN: 22 mg/dL (ref 6–23)
CO2: 29 mEq/L (ref 19–32)
Calcium: 10 mg/dL (ref 8.4–10.5)
Chloride: 102 mEq/L (ref 96–112)
Creatinine, Ser: 1.69 mg/dL — ABNORMAL HIGH (ref 0.40–1.50)
GFR: 42.46 mL/min — ABNORMAL LOW (ref >60.00)
Glucose, Bld: 107 mg/dL — ABNORMAL HIGH (ref 70–99)
Potassium: 4.3 mEq/L (ref 3.5–5.1)
Sodium: 139 mEq/L (ref 135–145)

## 2015-06-25 LAB — HEMOGLOBIN A1C: Hgb A1c MFr Bld: 6.2 % (ref 4.6–6.5)

## 2015-06-25 MED ORDER — LOSARTAN POTASSIUM 100 MG PO TABS: 50.0000 mg | ORAL_TABLET | Freq: Every day | ORAL | Status: DC

## 2015-06-25 MED ORDER — METFORMIN HCL 500 MG PO TABS: 500.0000 mg | ORAL_TABLET | Freq: Three times a day (TID) | ORAL | Status: DC

## 2015-06-25 MED ORDER — CYANOCOBALAMIN 1000 MCG/ML IJ SOLN: 1000.0000 ug | INTRAMUSCULAR | Status: DC

## 2015-06-25 NOTE — Assessment & Plan Note (Signed)
On B12 

## 2015-06-25 NOTE — Assessment & Plan Note (Signed)
On Vit D 

## 2015-06-25 NOTE — Progress Notes (Signed)
Pre visit review using our clinic review tool, if applicable. No additional management support is needed unless otherwise documented below in the visit note. 

## 2015-06-25 NOTE — Assessment & Plan Note (Signed)
On Hytrin, Coreg, Losatan

## 2015-06-25 NOTE — Progress Notes (Signed)
   Subjective:    HPI    The patient presents for a follow-up of  Chronic vit D and B12 def, chronic dyslipidemia, type 2 diabetes controlled with medicines, neuropathy sx's. He retired in 4/13.  F/u knee OA - s/p B THR by Dr Alvan Dame - recovered well   BP is nl at home  BP Readings from Last 3 Encounters:  06/25/15 100/69  02/23/15 130/70  09/30/14 120/70   Wt Readings from Last 3 Encounters:  06/25/15 152 lb (68.947 kg)  02/23/15 148 lb (67.132 kg)  09/30/14 148 lb (67.132 kg)      Review of Systems  Constitutional: Negative for appetite change, fatigue and unexpected weight change.  HENT: Negative for congestion, nosebleeds, sneezing, sore throat and trouble swallowing.   Eyes: Negative for itching and visual disturbance.  Cardiovascular: Negative for leg swelling.  Gastrointestinal: Negative for diarrhea, blood in stool and abdominal distention.  Genitourinary: Negative for frequency and hematuria.  Musculoskeletal: Negative for back pain, joint swelling, gait problem and neck pain.  Skin: Negative for rash.  Neurological: Negative for tremors and speech difficulty.  Psychiatric/Behavioral: Negative for sleep disturbance, dysphoric mood and agitation. The patient is not nervous/anxious.        Objective:   Physical Exam  Constitutional: He is oriented to person, place, and time. He appears well-developed.  HENT:  Mouth/Throat: Oropharynx is clear and moist.  Eyes: Conjunctivae are normal. Pupils are equal, round, and reactive to light.  Neck: Normal range of motion. No JVD present. No thyromegaly present.  Cardiovascular: Normal rate, regular rhythm, normal heart sounds and intact distal pulses.  Exam reveals no gallop and no friction rub.   No murmur heard. Pulmonary/Chest: Effort normal and breath sounds normal. No respiratory distress. He has no wheezes. He has no rales. He exhibits no tenderness.  Abdominal: Soft. Bowel sounds are normal. He exhibits no  distension and no mass. There is no tenderness. There is no rebound and no guarding.  Musculoskeletal: Normal range of motion. He exhibits no edema or tenderness.  Lymphadenopathy:    He has no cervical adenopathy.  Neurological: He is alert and oriented to person, place, and time. He has normal reflexes. No cranial nerve deficit. He exhibits normal muscle tone. Coordination normal.  Skin: Skin is warm and dry. No rash noted.  Psychiatric: He has a normal mood and affect. His behavior is normal. Judgment and thought content normal.  looks well   Lab Results  Component Value Date   WBC 14.7* 09/15/2014   HGB 12.6* 09/15/2014   HCT 37.2* 09/15/2014   PLT 298 09/15/2014   GLUCOSE 119* 02/23/2015   CHOL 134 02/23/2015   TRIG 73.0 02/23/2015   HDL 37.80* 02/23/2015   LDLCALC 82 02/23/2015   ALT 20 02/23/2015   AST 24 02/23/2015   NA 139 02/23/2015   K 4.2 02/23/2015   CL 105 02/23/2015   CREATININE 1.59* 02/23/2015   BUN 17 02/23/2015   CO2 31 02/23/2015   TSH 1.35 11/24/2013   PSA 0.42 02/19/2013   INR 1.01 09/03/2014   HGBA1C 6.6* 02/23/2015    abd CT     Assessment & Plan:

## 2015-06-25 NOTE — Assessment & Plan Note (Signed)
On metformin

## 2015-07-27 ENCOUNTER — Other Ambulatory Visit: Payer: Self-pay | Admitting: Internal Medicine

## 2015-09-13 ENCOUNTER — Other Ambulatory Visit: Payer: Self-pay

## 2015-09-13 MED ORDER — CARVEDILOL 12.5 MG PO TABS: 12.5000 mg | ORAL_TABLET | Freq: Two times a day (BID) | ORAL | Status: DC

## 2015-10-06 DIAGNOSIS — Z471 Aftercare following joint replacement surgery: Secondary | ICD-10-CM | POA: Diagnosis not present

## 2015-10-06 DIAGNOSIS — Z96651 Presence of right artificial knee joint: Secondary | ICD-10-CM | POA: Diagnosis not present

## 2015-10-06 DIAGNOSIS — Z96652 Presence of left artificial knee joint: Secondary | ICD-10-CM | POA: Diagnosis not present

## 2015-10-06 DIAGNOSIS — Z96653 Presence of artificial knee joint, bilateral: Secondary | ICD-10-CM | POA: Diagnosis not present

## 2015-12-28 ENCOUNTER — Other Ambulatory Visit: Payer: Medicare Other

## 2015-12-28 ENCOUNTER — Other Ambulatory Visit (INDEPENDENT_AMBULATORY_CARE_PROVIDER_SITE_OTHER): Payer: Medicare Other

## 2015-12-28 ENCOUNTER — Ambulatory Visit (INDEPENDENT_AMBULATORY_CARE_PROVIDER_SITE_OTHER): Payer: Medicare Other | Admitting: Internal Medicine

## 2015-12-28 ENCOUNTER — Encounter: Payer: Self-pay | Admitting: Internal Medicine

## 2015-12-28 VITALS — BP 114/70 | HR 60 | Wt 148.0 lb

## 2015-12-28 DIAGNOSIS — E785 Hyperlipidemia, unspecified: Secondary | ICD-10-CM

## 2015-12-28 DIAGNOSIS — E538 Deficiency of other specified B group vitamins: Secondary | ICD-10-CM | POA: Diagnosis not present

## 2015-12-28 DIAGNOSIS — E559 Vitamin D deficiency, unspecified: Secondary | ICD-10-CM | POA: Diagnosis not present

## 2015-12-28 DIAGNOSIS — E119 Type 2 diabetes mellitus without complications: Secondary | ICD-10-CM

## 2015-12-28 LAB — HEPATIC FUNCTION PANEL
ALT: 13 U/L (ref 0–53)
AST: 19 U/L (ref 0–37)
Albumin: 4.3 g/dL (ref 3.5–5.2)
Alkaline Phosphatase: 55 U/L (ref 39–117)
Bilirubin, Direct: 0.1 mg/dL (ref 0.0–0.3)
Total Bilirubin: 0.5 mg/dL (ref 0.2–1.2)
Total Protein: 7.6 g/dL (ref 6.0–8.3)

## 2015-12-28 LAB — CBC WITH DIFFERENTIAL/PLATELET
Basophils Absolute: 0 K/uL (ref 0.0–0.1)
Basophils Relative: 0.6 % (ref 0.0–3.0)
Eosinophils Absolute: 0.2 K/uL (ref 0.0–0.7)
Eosinophils Relative: 3.1 % (ref 0.0–5.0)
HCT: 42.1 % (ref 39.0–52.0)
Hemoglobin: 13.7 g/dL (ref 13.0–17.0)
Lymphocytes Relative: 43 % (ref 12.0–46.0)
Lymphs Abs: 3.4 K/uL (ref 0.7–4.0)
MCHC: 32.5 g/dL (ref 30.0–36.0)
MCV: 90.6 fl (ref 78.0–100.0)
Monocytes Absolute: 0.6 K/uL (ref 0.1–1.0)
Monocytes Relative: 7.5 % (ref 3.0–12.0)
Neutro Abs: 3.6 K/uL (ref 1.4–7.7)
Neutrophils Relative %: 45.8 % (ref 43.0–77.0)
Platelets: 354 K/uL (ref 150.0–400.0)
RBC: 4.65 Mil/uL (ref 4.22–5.81)
RDW: 14.6 % (ref 11.5–15.5)
WBC: 7.9 K/uL (ref 4.0–10.5)

## 2015-12-28 LAB — URINALYSIS
Bilirubin Urine: NEGATIVE
Hgb urine dipstick: NEGATIVE
Ketones, ur: NEGATIVE
Leukocytes, UA: NEGATIVE
Nitrite: NEGATIVE
Specific Gravity, Urine: >=1.03 — AB (ref 1.000–1.030)
Total Protein, Urine: NEGATIVE
Urine Glucose: NEGATIVE
Urobilinogen, UA: 0.2 (ref 0.0–1.0)
pH: 5.5 (ref 5.0–8.0)

## 2015-12-28 LAB — LIPID PANEL
Cholesterol: 218 mg/dL — ABNORMAL HIGH (ref 0–200)
HDL: 46.6 mg/dL
LDL Cholesterol: 148 mg/dL — ABNORMAL HIGH (ref 0–99)
NonHDL: 171.73
Total CHOL/HDL Ratio: 5
Triglycerides: 121 mg/dL (ref 0.0–149.0)
VLDL: 24.2 mg/dL (ref 0.0–40.0)

## 2015-12-28 LAB — BASIC METABOLIC PANEL
BUN: 18 mg/dL (ref 6–23)
CO2: 30 mEq/L (ref 19–32)
Calcium: 10.3 mg/dL (ref 8.4–10.5)
Chloride: 105 mEq/L (ref 96–112)
Creatinine, Ser: 1.5 mg/dL (ref 0.40–1.50)
GFR: 48.66 mL/min — ABNORMAL LOW (ref >60.00)
Glucose, Bld: 126 mg/dL — ABNORMAL HIGH (ref 70–99)
Potassium: 4.3 mEq/L (ref 3.5–5.1)
Sodium: 140 mEq/L (ref 135–145)

## 2015-12-28 LAB — HEMOGLOBIN A1C: Hgb A1c MFr Bld: 6.3 % (ref 4.6–6.5)

## 2015-12-28 LAB — IBC PANEL
Iron: 111 ug/dL (ref 42–165)
Saturation Ratios: 23.8 % (ref 20.0–50.0)
Transferrin: 333 mg/dL (ref 212.0–360.0)

## 2015-12-28 LAB — PSA: PSA: 1.14 ng/mL (ref 0.10–4.00)

## 2015-12-28 LAB — TSH: TSH: 1.64 u[IU]/mL (ref 0.35–4.50)

## 2015-12-28 MED ORDER — FENOFIBRATE 160 MG PO TABS: 160.0000 mg | ORAL_TABLET | Freq: Every day | ORAL | Status: DC

## 2015-12-28 NOTE — Assessment & Plan Note (Signed)
On Fenofibrate 

## 2015-12-28 NOTE — Assessment & Plan Note (Signed)
Metformin Labs 

## 2015-12-28 NOTE — Assessment & Plan Note (Signed)
On Vit D 

## 2015-12-28 NOTE — Assessment & Plan Note (Signed)
On B12 

## 2015-12-28 NOTE — Progress Notes (Signed)
Subjective:  Patient ID: Louis Kemp, male    DOB: 1941/12/13  Age: 75 y.o. MRN: [id number]  CC: No chief complaint on file.   HPI Louis Kemp presents for HTN, B12 def, DM2 f/u  Outpatient Prescriptions Prior to Visit  Medication Sig Dispense Refill  . carvedilol (COREG) 12.5 MG tablet Take 1 tablet (12.5 mg total) by mouth 2 (two) times daily with a meal. 180 tablet 3  . Cholecalciferol (VITAMIN D) 2000 UNITS CAPS Take 1 capsule by mouth every morning.    . cyanocobalamin (,VITAMIN B-12,) 1000 MCG/ML injection Inject 1 mL (1,000 mcg total) into the muscle every 14 (fourteen) days. 30 mL 6  . losartan (COZAAR) 100 MG tablet TAKE ONE TABLET BY MOUTH DAILY 90 tablet 3  . metFORMIN (GLUCOPHAGE) 500 MG tablet Take 1 tablet (500 mg total) by mouth 3 (three) times daily. 270 tablet 11  . losartan (COZAAR) 100 MG tablet Take 0.5 tablets (50 mg total) by mouth daily. 90 tablet 1  . aspirin EC 325 MG EC tablet Take 1 tablet (325 mg total) by mouth 2 (two) times daily. 60 tablet 0  . ferrous sulfate 325 (65 FE) MG tablet Take 1 tablet (325 mg total) by mouth 3 (three) times daily after meals. (Patient not taking: Reported on 12/28/2015)  3  . fenofibrate (TRICOR) 145 MG tablet Take 145 mg by mouth every morning. Reported on 12/28/2015    . terazosin (HYTRIN) 2 MG capsule TAKE 1 CAPSULE BY MOUTH TWICE DAILY (Patient not taking: Reported on 12/28/2015) 180 capsule 3   No facility-administered medications prior to visit.    ROS Review of Systems  Constitutional: Negative for appetite change, fatigue and unexpected weight change.  HENT: Negative for congestion, nosebleeds, sneezing, sore throat and trouble swallowing.   Eyes: Negative for itching and visual disturbance.  Respiratory: Negative for cough.   Cardiovascular: Negative for chest pain, palpitations and leg swelling.  Gastrointestinal: Negative for nausea, diarrhea, blood in stool and abdominal distention.  Genitourinary: Negative for  frequency and hematuria.  Musculoskeletal: Negative for back pain, joint swelling, gait problem and neck pain.  Skin: Negative for rash.  Neurological: Negative for dizziness, tremors, speech difficulty and weakness.  Psychiatric/Behavioral: Negative for sleep disturbance, dysphoric mood and agitation. The patient is not nervous/anxious.     Objective:  BP 114/70 mmHg  Pulse 60  Wt 148 lb (67.132 kg)  SpO2 97%  BP Readings from Last 3 Encounters:  12/28/15 114/70  06/25/15 100/69  02/23/15 130/70    Wt Readings from Last 3 Encounters:  12/28/15 148 lb (67.132 kg)  06/25/15 152 lb (68.947 kg)  02/23/15 148 lb (67.132 kg)    Physical Exam  Constitutional: He is oriented to person, place, and time. He appears well-developed. No distress.  NAD  HENT:  Mouth/Throat: Oropharynx is clear and moist.  Eyes: Conjunctivae are normal. Pupils are equal, round, and reactive to light.  Neck: Normal range of motion. No JVD present. No thyromegaly present.  Cardiovascular: Normal rate, regular rhythm, normal heart sounds and intact distal pulses.  Exam reveals no gallop and no friction rub.   No murmur heard. Pulmonary/Chest: Effort normal and breath sounds normal. No respiratory distress. He has no wheezes. He has no rales. He exhibits no tenderness.  Abdominal: Soft. Bowel sounds are normal. He exhibits no distension and no mass. There is no tenderness. There is no rebound and no guarding.  Musculoskeletal: Normal range of motion. He exhibits no edema  or tenderness.  Lymphadenopathy:    He has no cervical adenopathy.  Neurological: He is alert and oriented to person, place, and time. He has normal reflexes. No cranial nerve deficit. He exhibits normal muscle tone. He displays a negative Romberg sign. Coordination and gait normal.  Skin: Skin is warm and dry. No rash noted.  Psychiatric: He has a normal mood and affect. His behavior is normal. Judgment and thought content normal.    Lab  Results  Component Value Date   WBC 14.7* 09/15/2014   HGB 12.6* 09/15/2014   HCT 37.2* 09/15/2014   PLT 298 09/15/2014   GLUCOSE 107* 06/25/2015   CHOL 134 02/23/2015   TRIG 73.0 02/23/2015   HDL 37.80* 02/23/2015   LDLCALC 82 02/23/2015   ALT 20 02/23/2015   AST 24 02/23/2015   NA 139 06/25/2015   K 4.3 06/25/2015   CL 102 06/25/2015   CREATININE 1.69* 06/25/2015   BUN 22 06/25/2015   CO2 29 06/25/2015   TSH 1.35 11/24/2013   PSA 0.42 02/19/2013   INR 1.01 09/03/2014   HGBA1C 6.2 06/25/2015    No results found.  Assessment & Plan:   Diagnoses and all orders for this visit:  B12 deficiency -     Hemoglobin A1c; Future -     Hepatic function panel; Future -     Basic metabolic panel; Future -     PSA; Future -     Lipid panel; Future -     TSH; Future -     Urinalysis; Future -     CBC with Differential/Platelet; Future -     IBC panel; Future  Controlled type 2 diabetes mellitus without complication, without long-term current use of insulin (HCC) -     Hemoglobin A1c; Future -     Hepatic function panel; Future -     Basic metabolic panel; Future -     PSA; Future -     Lipid panel; Future -     TSH; Future -     Urinalysis; Future -     CBC with Differential/Platelet; Future -     IBC panel; Future  Vitamin D deficiency -     Hemoglobin A1c; Future -     Hepatic function panel; Future -     Basic metabolic panel; Future -     PSA; Future -     Lipid panel; Future -     TSH; Future -     Urinalysis; Future -     CBC with Differential/Platelet; Future -     IBC panel; Future  Dyslipidemia -     Hemoglobin A1c; Future -     Hepatic function panel; Future -     Basic metabolic panel; Future -     PSA; Future -     Lipid panel; Future -     TSH; Future -     Urinalysis; Future -     CBC with Differential/Platelet; Future -     IBC panel; Future  Other orders -     fenofibrate 160 MG tablet; Take 1 tablet (160 mg total) by mouth daily.  I have  discontinued Mr. Taniguchi fenofibrate and terazosin. I have also changed his fenofibrate. Additionally, I am having him maintain his Vitamin D, aspirin, ferrous sulfate, metFORMIN, cyanocobalamin, losartan, carvedilol, and tamsulosin.  Meds ordered this encounter  Medications  . DISCONTD: fenofibrate 160 MG tablet    Sig: Take 160 mg by mouth daily.  Marland Kitchen  tamsulosin (FLOMAX) 0.4 MG CAPS capsule    Sig: Take 0.4 mg by mouth daily.  . fenofibrate 160 MG tablet    Sig: Take 1 tablet (160 mg total) by mouth daily.    Dispense:  90 tablet    Refill:  3     Follow-up: Return in about 6 months (around 06/26/2016) for Wellness Exam.  Walker Kehr, MD

## 2015-12-28 NOTE — Progress Notes (Signed)
Pre visit review using our clinic review tool, if applicable. No additional management support is needed unless otherwise documented below in the visit note. 

## 2016-05-01 ENCOUNTER — Other Ambulatory Visit: Payer: Self-pay | Admitting: Internal Medicine

## 2016-06-29 ENCOUNTER — Ambulatory Visit: Payer: Medicare Other | Admitting: Internal Medicine

## 2016-07-07 ENCOUNTER — Other Ambulatory Visit (INDEPENDENT_AMBULATORY_CARE_PROVIDER_SITE_OTHER): Payer: Medicare Other

## 2016-07-07 ENCOUNTER — Ambulatory Visit (INDEPENDENT_AMBULATORY_CARE_PROVIDER_SITE_OTHER): Payer: Medicare Other | Admitting: Internal Medicine

## 2016-07-07 ENCOUNTER — Encounter: Payer: Self-pay | Admitting: Internal Medicine

## 2016-07-07 VITALS — BP 140/60 | HR 48 | Wt 152.0 lb

## 2016-07-07 DIAGNOSIS — I10 Essential (primary) hypertension: Secondary | ICD-10-CM

## 2016-07-07 DIAGNOSIS — E538 Deficiency of other specified B group vitamins: Secondary | ICD-10-CM

## 2016-07-07 DIAGNOSIS — E119 Type 2 diabetes mellitus without complications: Secondary | ICD-10-CM | POA: Diagnosis not present

## 2016-07-07 DIAGNOSIS — I491 Atrial premature depolarization: Secondary | ICD-10-CM | POA: Diagnosis not present

## 2016-07-07 DIAGNOSIS — E785 Hyperlipidemia, unspecified: Secondary | ICD-10-CM

## 2016-07-07 LAB — BASIC METABOLIC PANEL
BUN: 20 mg/dL (ref 6–23)
CO2: 25 mEq/L (ref 19–32)
Calcium: 9.7 mg/dL (ref 8.4–10.5)
Chloride: 106 mEq/L (ref 96–112)
Creatinine, Ser: 1.49 mg/dL (ref 0.40–1.50)
GFR: 48.97 mL/min — ABNORMAL LOW (ref >60.00)
Glucose, Bld: 139 mg/dL — ABNORMAL HIGH (ref 70–99)
Potassium: 4.5 mEq/L (ref 3.5–5.1)
Sodium: 140 mEq/L (ref 135–145)

## 2016-07-07 LAB — HEMOGLOBIN A1C: Hgb A1c MFr Bld: 6.2 % (ref 4.6–6.5)

## 2016-07-07 NOTE — Assessment & Plan Note (Signed)
On Hytrin, Coreg - reduce dose, Losatan

## 2016-07-07 NOTE — Assessment & Plan Note (Signed)
Not on krill oil

## 2016-07-07 NOTE — Assessment & Plan Note (Signed)
On Metformin 

## 2016-07-07 NOTE — Progress Notes (Signed)
Pre visit review using our clinic review tool, if applicable. No additional management support is needed unless otherwise documented below in the visit note. 

## 2016-07-07 NOTE — Progress Notes (Signed)
Subjective:  Patient ID: Louis Kemp, male    DOB: 05/02/1941  Age: 75 y.o. MRN: [id number]  CC: No chief complaint on file.   HPI ETHANAEL MARIA presents for HTN, DM, dyslipidemia f/u  Outpatient Prescriptions Prior to Visit  Medication Sig Dispense Refill  . carvedilol (COREG) 12.5 MG tablet Take 1 tablet (12.5 mg total) by mouth 2 (two) times daily with a meal. 180 tablet 3  . Cholecalciferol (VITAMIN D) 2000 UNITS CAPS Take 1 capsule by mouth every morning.    . cyanocobalamin (,VITAMIN B-12,) 1000 MCG/ML injection Inject 1 mL (1,000 mcg total) into the muscle every 14 (fourteen) days. 30 mL 6  . fenofibrate 160 MG tablet Take 1 tablet (160 mg total) by mouth daily. 90 tablet 3  . losartan (COZAAR) 100 MG tablet TAKE ONE TABLET BY MOUTH DAILY 90 tablet 3  . metFORMIN (GLUCOPHAGE) 500 MG tablet TAKE ONE TABLET BY MOUTH THREE TIMES DAILY 270 tablet 2  . tamsulosin (FLOMAX) 0.4 MG CAPS capsule Take 0.4 mg by mouth daily.    . ferrous sulfate 325 (65 FE) MG tablet Take 1 tablet (325 mg total) by mouth 3 (three) times daily after meals. (Patient not taking: Reported on 07/07/2016)  3  . aspirin EC 325 MG EC tablet Take 1 tablet (325 mg total) by mouth 2 (two) times daily. 60 tablet 0   No facility-administered medications prior to visit.    ROS Review of Systems  Constitutional: Negative for appetite change, fatigue and unexpected weight change.  HENT: Negative for congestion, nosebleeds, sneezing, sore throat and trouble swallowing.   Eyes: Negative for itching and visual disturbance.  Respiratory: Negative for cough.   Cardiovascular: Negative for chest pain, palpitations and leg swelling.  Gastrointestinal: Negative for nausea, diarrhea, blood in stool and abdominal distention.  Genitourinary: Negative for frequency and hematuria.  Musculoskeletal: Negative for back pain, joint swelling, gait problem and neck pain.  Skin: Negative for rash.  Neurological: Negative for  dizziness, tremors, speech difficulty and weakness.  Psychiatric/Behavioral: Negative for sleep disturbance, dysphoric mood and agitation. The patient is not nervous/anxious.     Objective:  BP 140/60 mmHg  Pulse 48  Wt 152 lb (68.947 kg)  SpO2 98%  BP Readings from Last 3 Encounters:  07/07/16 140/60  12/28/15 114/70  06/25/15 100/69    Wt Readings from Last 3 Encounters:  07/07/16 152 lb (68.947 kg)  12/28/15 148 lb (67.132 kg)  06/25/15 152 lb (68.947 kg)    Physical Exam  Constitutional: He is oriented to person, place, and time. He appears well-developed. No distress.  NAD  HENT:  Mouth/Throat: Oropharynx is clear and moist.  Eyes: Conjunctivae are normal. Pupils are equal, round, and reactive to light.  Neck: Normal range of motion. No JVD present. No thyromegaly present.  Cardiovascular: Normal rate, regular rhythm, normal heart sounds and intact distal pulses.  Exam reveals no gallop and no friction rub.   No murmur heard. Pulmonary/Chest: Effort normal and breath sounds normal. No respiratory distress. He has no wheezes. He has no rales. He exhibits no tenderness.  Abdominal: Soft. Bowel sounds are normal. He exhibits no distension and no mass. There is no tenderness. There is no rebound and no guarding.  Musculoskeletal: Normal range of motion. He exhibits no edema or tenderness.  Lymphadenopathy:    He has no cervical adenopathy.  Neurological: He is alert and oriented to person, place, and time. He has normal reflexes. No cranial nerve deficit.  He exhibits normal muscle tone. He displays a negative Romberg sign. Coordination and gait normal.  Skin: Skin is warm and dry. No rash noted.  Psychiatric: He has a normal mood and affect. His behavior is normal. Judgment and thought content normal.    Lab Results  Component Value Date   WBC 7.9 12/28/2015   HGB 13.7 12/28/2015   HCT 42.1 12/28/2015   PLT 354.0 12/28/2015   GLUCOSE 126* 12/28/2015   CHOL 218*  12/28/2015   TRIG 121.0 12/28/2015   HDL 46.60 12/28/2015   LDLCALC 148* 12/28/2015   ALT 13 12/28/2015   AST 19 12/28/2015   NA 140 12/28/2015   K 4.3 12/28/2015   CL 105 12/28/2015   CREATININE 1.50 12/28/2015   BUN 18 12/28/2015   CO2 30 12/28/2015   TSH 1.64 12/28/2015   PSA 1.14 12/28/2015   INR 1.01 09/03/2014   HGBA1C 6.3 12/28/2015    No results found.  Assessment & Plan:   There are no diagnoses linked to this encounter. I have discontinued Mr. Stiggers aspirin. I am also having him maintain his Vitamin D, ferrous sulfate, cyanocobalamin, losartan, carvedilol, tamsulosin, fenofibrate, metFORMIN, and aspirin EC.  Meds ordered this encounter  Medications  . aspirin EC 81 MG tablet    Sig: Take 81 mg by mouth daily.     Follow-up: No Follow-up on file.  Walker Kehr, MD

## 2016-07-07 NOTE — Assessment & Plan Note (Signed)
Doing well 

## 2016-07-07 NOTE — Assessment & Plan Note (Signed)
On B12 

## 2016-10-01 ENCOUNTER — Other Ambulatory Visit: Payer: Self-pay | Admitting: Internal Medicine

## 2016-10-19 ENCOUNTER — Other Ambulatory Visit: Payer: Self-pay | Admitting: *Deleted

## 2016-10-19 MED ORDER — LOSARTAN POTASSIUM 100 MG PO TABS: 100.0000 mg | ORAL_TABLET | Freq: Every day | ORAL | 2 refills | Status: DC

## 2016-11-28 ENCOUNTER — Other Ambulatory Visit: Payer: Self-pay | Admitting: *Deleted

## 2016-11-28 MED ORDER — TAMSULOSIN HCL 0.4 MG PO CAPS: 0.4000 mg | ORAL_CAPSULE | Freq: Every day | ORAL | 0 refills | Status: DC

## 2016-12-27 ENCOUNTER — Other Ambulatory Visit: Payer: Self-pay | Admitting: Internal Medicine

## 2017-01-02 ENCOUNTER — Other Ambulatory Visit: Payer: Self-pay | Admitting: General Practice

## 2017-01-02 MED ORDER — FENOFIBRATE 160 MG PO TABS: 160.0000 mg | ORAL_TABLET | Freq: Every day | ORAL | 1 refills | Status: DC

## 2017-01-08 ENCOUNTER — Ambulatory Visit (INDEPENDENT_AMBULATORY_CARE_PROVIDER_SITE_OTHER): Payer: Medicare Other | Admitting: Internal Medicine

## 2017-01-08 ENCOUNTER — Other Ambulatory Visit (INDEPENDENT_AMBULATORY_CARE_PROVIDER_SITE_OTHER): Payer: Medicare Other

## 2017-01-08 ENCOUNTER — Encounter: Payer: Self-pay | Admitting: Internal Medicine

## 2017-01-08 VITALS — BP 134/80 | HR 56 | Ht 67.0 in | Wt 146.0 lb

## 2017-01-08 DIAGNOSIS — E119 Type 2 diabetes mellitus without complications: Secondary | ICD-10-CM

## 2017-01-08 DIAGNOSIS — Z0000 Encounter for general adult medical examination without abnormal findings: Secondary | ICD-10-CM

## 2017-01-08 DIAGNOSIS — N320 Bladder-neck obstruction: Secondary | ICD-10-CM

## 2017-01-08 DIAGNOSIS — E538 Deficiency of other specified B group vitamins: Secondary | ICD-10-CM

## 2017-01-08 DIAGNOSIS — R634 Abnormal weight loss: Secondary | ICD-10-CM | POA: Diagnosis not present

## 2017-01-08 DIAGNOSIS — E785 Hyperlipidemia, unspecified: Secondary | ICD-10-CM | POA: Diagnosis not present

## 2017-01-08 DIAGNOSIS — E559 Vitamin D deficiency, unspecified: Secondary | ICD-10-CM

## 2017-01-08 DIAGNOSIS — I10 Essential (primary) hypertension: Secondary | ICD-10-CM | POA: Diagnosis not present

## 2017-01-08 LAB — URINALYSIS
Bilirubin Urine: NEGATIVE
Hgb urine dipstick: NEGATIVE
Ketones, ur: NEGATIVE
Leukocytes, UA: NEGATIVE
Nitrite: NEGATIVE
Specific Gravity, Urine: 1.015 (ref 1.000–1.030)
Total Protein, Urine: NEGATIVE
Urine Glucose: NEGATIVE
Urobilinogen, UA: 0.2 (ref 0.0–1.0)
pH: 5.5 (ref 5.0–8.0)

## 2017-01-08 LAB — HEMOGLOBIN A1C: Hgb A1c MFr Bld: 6.3 % (ref 4.6–6.5)

## 2017-01-08 LAB — CBC WITH DIFFERENTIAL/PLATELET
Basophils Absolute: 0.1 10*3/uL (ref 0.0–0.1)
Basophils Relative: 0.7 % (ref 0.0–3.0)
Eosinophils Absolute: 0.3 10*3/uL (ref 0.0–0.7)
Eosinophils Relative: 3.2 % (ref 0.0–5.0)
HCT: 41.3 % (ref 39.0–52.0)
Hemoglobin: 13.8 g/dL (ref 13.0–17.0)
Lymphocytes Relative: 43 % (ref 12.0–46.0)
Lymphs Abs: 3.7 10*3/uL (ref 0.7–4.0)
MCHC: 33.4 g/dL (ref 30.0–36.0)
MCV: 89.9 fl (ref 78.0–100.0)
Monocytes Absolute: 0.7 10*3/uL (ref 0.1–1.0)
Monocytes Relative: 8.4 % (ref 3.0–12.0)
Neutro Abs: 3.8 10*3/uL (ref 1.4–7.7)
Neutrophils Relative %: 44.7 % (ref 43.0–77.0)
Platelets: 322 10*3/uL (ref 150.0–400.0)
RBC: 4.59 Mil/uL (ref 4.22–5.81)
RDW: 13.8 % (ref 11.5–15.5)
WBC: 8.5 10*3/uL (ref 4.0–10.5)

## 2017-01-08 LAB — HEPATIC FUNCTION PANEL
ALT: 14 U/L (ref 0–53)
AST: 21 U/L (ref 0–37)
Albumin: 4.3 g/dL (ref 3.5–5.2)
Alkaline Phosphatase: 49 U/L (ref 39–117)
Bilirubin, Direct: 0.1 mg/dL (ref 0.0–0.3)
Total Bilirubin: 0.4 mg/dL (ref 0.2–1.2)
Total Protein: 7.4 g/dL (ref 6.0–8.3)

## 2017-01-08 LAB — BASIC METABOLIC PANEL
BUN: 18 mg/dL (ref 6–23)
CO2: 28 mEq/L (ref 19–32)
Calcium: 9.9 mg/dL (ref 8.4–10.5)
Chloride: 104 mEq/L (ref 96–112)
Creatinine, Ser: 1.5 mg/dL (ref 0.40–1.50)
GFR: 48.52 mL/min — ABNORMAL LOW (ref >60.00)
Glucose, Bld: 99 mg/dL (ref 70–99)
Potassium: 4.1 mEq/L (ref 3.5–5.1)
Sodium: 141 mEq/L (ref 135–145)

## 2017-01-08 LAB — LIPID PANEL
Cholesterol: 201 mg/dL — ABNORMAL HIGH (ref 0–200)
HDL: 45.5 mg/dL (ref >39.00)
LDL Cholesterol: 135 mg/dL — ABNORMAL HIGH (ref 0–99)
NonHDL: 155.12
Total CHOL/HDL Ratio: 4
Triglycerides: 102 mg/dL (ref 0.0–149.0)
VLDL: 20.4 mg/dL (ref 0.0–40.0)

## 2017-01-08 LAB — MICROALBUMIN / CREATININE URINE RATIO
Creatinine,U: 47.9 mg/dL
Microalb Creat Ratio: 1.7 mg/g (ref 0.0–30.0)
Microalb, Ur: 0.8 mg/dL (ref 0.0–1.9)

## 2017-01-08 LAB — TSH: TSH: 1.93 u[IU]/mL (ref 0.35–4.50)

## 2017-01-08 LAB — VITAMIN B12: Vitamin B-12: >1500 pg/mL — ABNORMAL HIGH (ref 211–911)

## 2017-01-08 LAB — PSA: PSA: 0.55 ng/mL (ref 0.10–4.00)

## 2017-01-08 MED ORDER — METFORMIN HCL 500 MG PO TABS: 500.0000 mg | ORAL_TABLET | Freq: Three times a day (TID) | ORAL | 3 refills | Status: DC

## 2017-01-08 MED ORDER — TAMSULOSIN HCL 0.4 MG PO CAPS: 0.4000 mg | ORAL_CAPSULE | Freq: Every day | ORAL | 3 refills | Status: DC

## 2017-01-08 MED ORDER — LOSARTAN POTASSIUM 100 MG PO TABS: 100.0000 mg | ORAL_TABLET | Freq: Every day | ORAL | 3 refills | Status: DC

## 2017-01-08 NOTE — Assessment & Plan Note (Signed)
Cont Hytrin, Coreg, [employer]

## 2017-01-08 NOTE — Assessment & Plan Note (Addendum)
Here for medicare wellness/physical  Diet: heart healthy  Physical activity: not sedentary  Depression/mood screen: negative  Hearing: intact to whispered voice  Visual acuity: grossly normal, performs annual eye exam  ADLs: capable  Fall risk: low to none  Home safety: good  Cognitive evaluation: intact to orientation, naming, recall and repetition  EOL planning: adv directives, full code/ I agree  I have personally reviewed and have noted  1. The patient's medical, surgical and social history  2. Their use of alcohol, tobacco or illicit drugs  3. Their current medications and supplements  4. The patient's functional ability including ADL's, fall risks, home safety risks and hearing or visual impairment.  5. Diet and physical activities  6. Evidence for depression or mood disorders 7. The roster of all physicians providing medical care to patient - is listed in the Snapshot section of the chart and reviewed today.    Today patient counseled on age appropriate routine health concerns for screening and prevention, each reviewed and up to date or declined. Immunizations reviewed and up to date or declined. Labs ordered and reviewed. Risk factors for depression reviewed and negative. Hearing function and visual acuity are intact. ADLs screened and addressed as needed. Functional ability and level of safety reviewed and appropriate. Education, counseling and referrals performed based on assessed risks today. Patient provided with a copy of personalized plan for preventive services.   Colon 2015 Dr Collene Mares

## 2017-01-08 NOTE — Assessment & Plan Note (Signed)
On B12 

## 2017-01-08 NOTE — Progress Notes (Signed)
Pre visit review using our clinic review tool, if applicable. No additional management support is needed unless otherwise documented below in the visit note. 

## 2017-01-08 NOTE — Assessment & Plan Note (Signed)
Metformin 

## 2017-01-08 NOTE — Assessment & Plan Note (Signed)
On Vit D 

## 2017-01-08 NOTE — Progress Notes (Signed)
Subjective:  Patient ID: Louis Kemp, male    DOB: [birth year]  Age: 76 y.o. MRN: [id number]  CC: No chief complaint on file.   HPI Louis Kemp presents for a well exam  Outpatient Medications Prior to Visit  Medication Sig Dispense Refill  . aspirin EC 81 MG tablet Take 81 mg by mouth daily.    . carvedilol (COREG) 12.5 MG tablet TAKE 1 TABLET (12.5 MG TOTAL) BY MOUTH 2 (TWO) TIMES DAILY WITH A MEAL. 180 tablet 3  . Cholecalciferol (VITAMIN D) 2000 UNITS CAPS Take 1 capsule by mouth every morning.    . cyanocobalamin (,VITAMIN B-12,) 1000 MCG/ML injection Inject 1 mL (1,000 mcg total) into the muscle every 14 (fourteen) days. 30 mL 6  . fenofibrate 160 MG tablet Take 1 tablet (160 mg total) by mouth daily. 90 tablet 1  . losartan (COZAAR) 100 MG tablet Take 1 tablet (100 mg total) by mouth daily. 90 tablet 2  . metFORMIN (GLUCOPHAGE) 500 MG tablet TAKE ONE TABLET BY MOUTH THREE TIMES DAILY 270 tablet 2  . tamsulosin (FLOMAX) 0.4 MG CAPS capsule Take 1 capsule (0.4 mg total) by mouth daily. Yearly physical is due must see MD for refills 30 capsule 0   No facility-administered medications prior to visit.     ROS Review of Systems  Constitutional: Negative for appetite change, fatigue and unexpected weight change.  HENT: Negative for congestion, nosebleeds, sneezing, sore throat and trouble swallowing.   Eyes: Negative for itching and visual disturbance.  Respiratory: Negative for cough.   Cardiovascular: Negative for chest pain, palpitations and leg swelling.  Gastrointestinal: Negative for abdominal distention, blood in stool, diarrhea and nausea.  Genitourinary: Negative for frequency and hematuria.  Musculoskeletal: Negative for back pain, gait problem, joint swelling and neck pain.  Skin: Negative for rash.  Neurological: Negative for dizziness, tremors, speech difficulty and weakness.  Psychiatric/Behavioral: Negative for agitation, dysphoric mood and sleep disturbance.  The patient is not nervous/anxious.     Objective:  BP 134/80   Pulse (!) 56   Ht 5\' 7"  (1.702 m)   Wt 146 lb (66.2 kg)   SpO2 97%   BMI 22.87 kg/m   BP Readings from Last 3 Encounters:  01/08/17 134/80  07/07/16 140/60  12/28/15 114/70    Wt Readings from Last 3 Encounters:  01/08/17 146 lb (66.2 kg)  07/07/16 152 lb (68.9 kg)  12/28/15 148 lb (67.1 kg)    Physical Exam  Constitutional: He is oriented to person, place, and time. He appears well-developed and well-nourished. No distress.  HENT:  Head: Normocephalic and atraumatic.  Right Ear: External ear normal.  Left Ear: External ear normal.  Nose: Nose normal.  Mouth/Throat: Oropharynx is clear and moist. No oropharyngeal exudate.  Eyes: Conjunctivae and EOM are normal. Pupils are equal, round, and reactive to light. Right eye exhibits no discharge. Left eye exhibits no discharge. No scleral icterus.  Neck: Normal range of motion. Neck supple. No JVD present. No tracheal deviation present. No thyromegaly present.  Cardiovascular: Normal rate, regular rhythm, normal heart sounds and intact distal pulses.  Exam reveals no gallop and no friction rub.   No murmur heard. Pulmonary/Chest: Effort normal and breath sounds normal. No stridor. No respiratory distress. He has no wheezes. He has no rales. He exhibits no tenderness.  Abdominal: Soft. Bowel sounds are normal. He exhibits no distension and no mass. There is no tenderness. There is no rebound and no guarding.  Genitourinary:  Rectum normal, prostate normal and penis normal. Rectal exam shows guaiac negative stool. No penile tenderness.  Musculoskeletal: Normal range of motion. He exhibits no edema or tenderness.  Lymphadenopathy:    He has no cervical adenopathy.  Neurological: He is alert and oriented to person, place, and time. He has normal reflexes. No cranial nerve deficit. He exhibits normal muscle tone. Coordination normal.  Skin: Skin is warm and dry. No rash  noted. He is not diaphoretic. No erythema. No pallor.  Psychiatric: He has a normal mood and affect. His behavior is normal. Judgment and thought content normal.    Lab Results  Component Value Date   WBC 7.9 12/28/2015   HGB 13.7 12/28/2015   HCT 42.1 12/28/2015   PLT 354.0 12/28/2015   GLUCOSE 139 (H) 07/07/2016   CHOL 218 (H) 12/28/2015   TRIG 121.0 12/28/2015   HDL 46.60 12/28/2015   LDLCALC 148 (H) 12/28/2015   ALT 13 12/28/2015   AST 19 12/28/2015   NA 140 07/07/2016   K 4.5 07/07/2016   CL 106 07/07/2016   CREATININE 1.49 07/07/2016   BUN 20 07/07/2016   CO2 25 07/07/2016   TSH 1.64 12/28/2015   PSA 1.14 12/28/2015   INR 1.01 09/03/2014   HGBA1C 6.2 07/07/2016    No results found.  Assessment & Plan:   There are no diagnoses linked to this encounter. I am having Mr. Louis Kemp maintain his Vitamin D, cyanocobalamin, metFORMIN, aspirin EC, carvedilol, losartan, tamsulosin, and fenofibrate.  No orders of the defined types were placed in this encounter.    Follow-up: No Follow-up on file.  Walker Kehr, MD

## 2017-01-08 NOTE — Assessment & Plan Note (Signed)
Labs

## 2017-06-15 DIAGNOSIS — H04123 Dry eye syndrome of bilateral lacrimal glands: Secondary | ICD-10-CM | POA: Diagnosis not present

## 2017-06-15 DIAGNOSIS — H02051 Trichiasis without entropian right upper eyelid: Secondary | ICD-10-CM | POA: Diagnosis not present

## 2017-06-15 DIAGNOSIS — H40013 Open angle with borderline findings, low risk, bilateral: Secondary | ICD-10-CM | POA: Diagnosis not present

## 2017-06-15 DIAGNOSIS — E119 Type 2 diabetes mellitus without complications: Secondary | ICD-10-CM | POA: Diagnosis not present

## 2017-06-27 ENCOUNTER — Other Ambulatory Visit: Payer: Self-pay | Admitting: Internal Medicine

## 2017-07-11 ENCOUNTER — Encounter: Payer: Self-pay | Admitting: Internal Medicine

## 2017-07-11 ENCOUNTER — Other Ambulatory Visit: Payer: Self-pay | Admitting: Internal Medicine

## 2017-07-11 ENCOUNTER — Ambulatory Visit (INDEPENDENT_AMBULATORY_CARE_PROVIDER_SITE_OTHER): Payer: Medicare Other | Admitting: Internal Medicine

## 2017-07-11 DIAGNOSIS — I10 Essential (primary) hypertension: Secondary | ICD-10-CM

## 2017-07-11 DIAGNOSIS — E119 Type 2 diabetes mellitus without complications: Secondary | ICD-10-CM | POA: Diagnosis not present

## 2017-07-11 DIAGNOSIS — E538 Deficiency of other specified B group vitamins: Secondary | ICD-10-CM | POA: Diagnosis not present

## 2017-07-11 MED ORDER — ZOSTER VAC RECOMB ADJUVANTED 50 MCG/0.5ML IM SUSR: 0.5000 mL | Freq: Once | INTRAMUSCULAR | 1 refills | Status: AC

## 2017-07-11 MED ORDER — ZOSTER VAC RECOMB ADJUVANTED 50 MCG/0.5ML IM SUSR: 0.5000 mL | Freq: Once | INTRAMUSCULAR | 1 refills | Status: DC

## 2017-07-11 NOTE — Progress Notes (Signed)
Subjective:  Patient ID: Louis Kemp, male    DOB: 1941-11-17  Age: 76 y.o. MRN: [id number]  CC: No chief complaint on file.   HPI Louis Kemp presents for DM, HTN, dyslipidemia f/u  Outpatient Medications Prior to Visit  Medication Sig Dispense Refill  . aspirin EC 81 MG tablet Take 81 mg by mouth daily.    . carvedilol (COREG) 12.5 MG tablet TAKE 1 TABLET (12.5 MG TOTAL) BY MOUTH 2 (TWO) TIMES DAILY WITH A MEAL. 180 tablet 3  . Cholecalciferol (VITAMIN D) 2000 UNITS CAPS Take 1 capsule by mouth every morning.    . cyanocobalamin (,VITAMIN B-12,) 1000 MCG/ML injection Inject 1 mL (1,000 mcg total) into the muscle every 14 (fourteen) days. 30 mL 6  . fenofibrate 160 MG tablet TAKE ONE TABLET BY MOUTH DAILY 90 tablet 2  . losartan (COZAAR) 100 MG tablet Take 1 tablet (100 mg total) by mouth daily. 90 tablet 3  . metFORMIN (GLUCOPHAGE) 500 MG tablet Take 1 tablet (500 mg total) by mouth 3 (three) times daily. 270 tablet 3  . tamsulosin (FLOMAX) 0.4 MG CAPS capsule Take 1 capsule (0.4 mg total) by mouth daily. 90 capsule 3   No facility-administered medications prior to visit.     ROS Review of Systems  Constitutional: Negative for appetite change, fatigue and unexpected weight change.  HENT: Negative for congestion, nosebleeds, sneezing, sore throat and trouble swallowing.   Eyes: Negative for itching and visual disturbance.  Respiratory: Negative for cough.   Cardiovascular: Negative for chest pain, palpitations and leg swelling.  Gastrointestinal: Negative for abdominal distention, blood in stool, diarrhea and nausea.  Genitourinary: Negative for frequency and hematuria.  Musculoskeletal: Negative for back pain, gait problem, joint swelling and neck pain.  Skin: Negative for rash.  Neurological: Negative for dizziness, tremors, speech difficulty and weakness.  Psychiatric/Behavioral: Negative for agitation, dysphoric mood and sleep disturbance. The patient is not  nervous/anxious.     Objective:  BP 128/82 (BP Location: Left Arm, Patient Position: Sitting, Cuff Size: Normal)   Pulse (!) 47   Temp 98.3 F (36.8 C) (Oral)   Ht 5\' 7"  (1.702 m)   Wt 145 lb (65.8 kg)   SpO2 98%   BMI 22.71 kg/m   BP Readings from Last 3 Encounters:  07/11/17 128/82  01/08/17 134/80  07/07/16 140/60    Wt Readings from Last 3 Encounters:  07/11/17 145 lb (65.8 kg)  01/08/17 146 lb (66.2 kg)  07/07/16 152 lb (68.9 kg)    Physical Exam  Constitutional: He is oriented to person, place, and time. He appears well-developed. No distress.  NAD  HENT:  Mouth/Throat: Oropharynx is clear and moist.  Eyes: Pupils are equal, round, and reactive to light. Conjunctivae are normal.  Neck: Normal range of motion. No JVD present. No thyromegaly present.  Cardiovascular: Normal rate, regular rhythm, normal heart sounds and intact distal pulses.  Exam reveals no gallop and no friction rub.   No murmur heard. Pulmonary/Chest: Effort normal and breath sounds normal. No respiratory distress. He has no wheezes. He has no rales. He exhibits no tenderness.  Abdominal: Soft. Bowel sounds are normal. He exhibits no distension and no mass. There is no tenderness. There is no rebound and no guarding.  Musculoskeletal: Normal range of motion. He exhibits no edema or tenderness.  Lymphadenopathy:    He has no cervical adenopathy.  Neurological: He is alert and oriented to person, place, and time. He has normal reflexes.  No cranial nerve deficit. He exhibits normal muscle tone. He displays a negative Romberg sign. Coordination and gait normal.  Skin: Skin is warm and dry. No rash noted.  Psychiatric: He has a normal mood and affect. His behavior is normal. Judgment and thought content normal.    Lab Results  Component Value Date   WBC 8.5 01/08/2017   HGB 13.8 01/08/2017   HCT 41.3 01/08/2017   PLT 322.0 01/08/2017   GLUCOSE 99 01/08/2017   CHOL 201 (H) 01/08/2017   TRIG 102.0  01/08/2017   HDL 45.50 01/08/2017   LDLCALC 135 (H) 01/08/2017   ALT 14 01/08/2017   AST 21 01/08/2017   NA 141 01/08/2017   K 4.1 01/08/2017   CL 104 01/08/2017   CREATININE 1.50 01/08/2017   BUN 18 01/08/2017   CO2 28 01/08/2017   TSH 1.93 01/08/2017   PSA 0.55 01/08/2017   INR 1.01 09/03/2014   HGBA1C 6.3 01/08/2017   MICROALBUR 0.8 01/08/2017    No results found.  Assessment & Plan:   There are no diagnoses linked to this encounter. I am having Louis Kemp maintain his Vitamin D, cyanocobalamin, aspirin EC, carvedilol, tamsulosin, losartan, metFORMIN, and fenofibrate.  No orders of the defined types were placed in this encounter.    Follow-up: No Follow-up on file.  Walker Kehr, MD

## 2017-07-11 NOTE — Assessment & Plan Note (Signed)
On B12 

## 2017-07-11 NOTE — Assessment & Plan Note (Addendum)
On Metformin Labs at [facility]

## 2017-07-11 NOTE — Assessment & Plan Note (Signed)
On Hytrin, Coreg, Losatan

## 2017-07-12 LAB — BASIC METABOLIC PANEL
BUN/Creatinine Ratio: 13 (ref 10–24)
BUN: 22 mg/dL (ref 8–27)
CO2: 23 mmol/L (ref 20–29)
Calcium: 9.9 mg/dL (ref 8.6–10.2)
Chloride: 101 mmol/L (ref 96–106)
Creatinine, Ser: 1.64 mg/dL — ABNORMAL HIGH (ref 0.76–1.27)
GFR calc Af Amer: 47 mL/min/{1.73_m2} — ABNORMAL LOW (ref >59)
GFR calc non Af Amer: 40 mL/min/{1.73_m2} — ABNORMAL LOW (ref >59)
Glucose: 106 mg/dL — ABNORMAL HIGH (ref 65–99)
Potassium: 4.7 mmol/L (ref 3.5–5.2)
Sodium: 138 mmol/L (ref 134–144)

## 2017-07-12 LAB — HGB A1C W/O EAG: Hgb A1c MFr Bld: 6.3 % — ABNORMAL HIGH (ref 4.8–5.6)

## 2017-07-29 ENCOUNTER — Other Ambulatory Visit: Payer: Self-pay | Admitting: Internal Medicine

## 2017-07-29 DIAGNOSIS — I10 Essential (primary) hypertension: Secondary | ICD-10-CM

## 2017-09-20 ENCOUNTER — Other Ambulatory Visit: Payer: Self-pay | Admitting: Internal Medicine

## 2017-12-17 ENCOUNTER — Other Ambulatory Visit: Payer: Self-pay | Admitting: Internal Medicine

## 2018-01-03 ENCOUNTER — Telehealth: Payer: Self-pay

## 2018-01-03 NOTE — Telephone Encounter (Signed)
Pt got a letter about the Losartan recall. Pt would like to know about possible medication change. Please advise.

## 2018-01-04 MED ORDER — IRBESARTAN 150 MG PO TABS: 150.0000 mg | ORAL_TABLET | Freq: Every day | ORAL | 3 refills | Status: DC

## 2018-01-04 NOTE — Telephone Encounter (Signed)
Ok Irbesartan (Rx emailed) Another option is to get Losartan from a different manufacturer Thx

## 2018-01-08 NOTE — Telephone Encounter (Signed)
Pt.notified

## 2018-01-10 ENCOUNTER — Other Ambulatory Visit: Payer: Self-pay | Admitting: Internal Medicine

## 2018-01-10 ENCOUNTER — Encounter: Payer: Self-pay | Admitting: Internal Medicine

## 2018-01-10 ENCOUNTER — Ambulatory Visit: Payer: Medicare Other | Admitting: Internal Medicine

## 2018-01-10 VITALS — BP 140/80 | HR 44 | Temp 97.7°F | Ht 67.0 in | Wt 144.0 lb

## 2018-01-10 DIAGNOSIS — E119 Type 2 diabetes mellitus without complications: Secondary | ICD-10-CM | POA: Diagnosis not present

## 2018-01-10 DIAGNOSIS — R7309 Other abnormal glucose: Secondary | ICD-10-CM | POA: Diagnosis not present

## 2018-01-10 DIAGNOSIS — I10 Essential (primary) hypertension: Secondary | ICD-10-CM | POA: Diagnosis not present

## 2018-01-10 DIAGNOSIS — N320 Bladder-neck obstruction: Secondary | ICD-10-CM

## 2018-01-10 DIAGNOSIS — Z7689 Persons encountering health services in other specified circumstances: Secondary | ICD-10-CM | POA: Diagnosis not present

## 2018-01-10 DIAGNOSIS — M25551 Pain in right hip: Secondary | ICD-10-CM | POA: Diagnosis not present

## 2018-01-10 DIAGNOSIS — E785 Hyperlipidemia, unspecified: Secondary | ICD-10-CM | POA: Diagnosis not present

## 2018-01-10 DIAGNOSIS — E538 Deficiency of other specified B group vitamins: Secondary | ICD-10-CM

## 2018-01-10 NOTE — Patient Instructions (Signed)
Iliotibial Band Syndrome Rehab Ask your health care provider which exercises are safe for you. Do exercises exactly as told by your health care provider and adjust them as directed. It is normal to feel mild stretching, pulling, tightness, or discomfort as you do these exercises, but you should stop right away if you feel sudden pain or your pain gets worse.Do not begin these exercises until told by your health care provider. Stretching and range of motion exercises These exercises warm up your muscles and joints and improve the movement and flexibility of your hip and pelvis. Exercise A: Quadriceps, prone  1. Lie on your abdomen on a firm surface, such as a bed or padded floor. 2. Bend your left / right knee and hold your ankle. If you cannot reach your ankle or pant leg, loop a belt around your foot and grab the belt instead. 3. Gently pull your heel toward your buttocks. Your knee should not slide out to the side. You should feel a stretch in the front of your thigh and knee. 4. Hold this position for __________ seconds. Repeat __________ times. Complete this stretch __________ times a day. Exercise B: Iliotibial band  1. Lie on your side with your left / right leg in the top position. 2. Bend both of your knees and grab your left / right ankle. Stretch out your bottom arm to help you balance. 3. Slowly bring your top knee back so your thigh goes behind your trunk. 4. Slowly lower your top leg toward the floor until you feel a gentle stretch on the outside of your left / right hip and thigh. If you do not feel a stretch and your knee will not fall farther, place the heel of your other foot on top of your knee and pull your knee down toward the floor with your foot. 5. Hold this position for __________ seconds. Repeat __________ times. Complete this stretch __________ times a day. Strengthening exercises These exercises build strength and endurance in your hip and pelvis. Endurance is the  ability to use your muscles for a long time, even after they get tired. Exercise C: Straight leg raises ( hip abductors) 1. Lie on your side with your left / right leg in the top position. Lie so your head, shoulder, knee, and hip line up. You may bend your bottom knee to help you balance. 2. Roll your hips slightly forward so your hips are stacked directly over each other and your left / right knee is facing forward. 3. Tense the muscles in your outer thigh and lift your top leg 4-6 inches (10-15 cm). 4. Hold this position for __________ seconds. 5. Slowly return to the starting position. Let your muscles relax completely before doing another repetition. Repeat __________ times. Complete this exercise __________ times a day. Exercise D: Straight leg raises ( hip extensors) 1. Lie on your abdomen on your bed or a firm surface. You can put a pillow under your hips if that is more comfortable. 2. Bend your left / right knee so your foot is straight up in the air. 3. Squeeze your buttock muscles and lift your left / right thigh off the bed. Do not let your back arch. 4. Tense this muscle as hard as you can without increasing any knee pain. 5. Hold this position for __________ seconds. 6. Slowly lower your leg to the starting position and allow it to relax completely. Repeat __________ times. Complete this exercise __________ times a day. Exercise E: Hip  5. Hold this position for __________ seconds.  6. Slowly lower your leg to the starting position and allow it to relax completely.  Repeat __________ times. Complete this exercise __________ times a day.  Exercise E: Hip hike  1. Stand sideways on a bottom step. Stand on your left / right leg with your other foot unsupported next to the step. You can hold onto the railing or wall if needed for balance.  2. Keep your knees straight and your torso square. Then, lift your left / right hip up toward the ceiling.  3. Slowly let your left / right hip lower toward the floor, past the starting position. Your foot should get closer to the floor. Do not lean or bend your knees.  Repeat __________ times. Complete this exercise __________ times a day.  This information is not  intended to replace advice given to you by your health care provider. Make sure you discuss any questions you have with your health care provider.  Document Released: 12/11/2005 Document Revised: 08/15/2016 Document Reviewed: 11/12/2015  Elsevier Interactive Patient Education © 2018 [employer]    Iliotibial Band Syndrome  Iliotibial band syndrome (ITBS) is a condition that often causes knee pain. It can also cause pain in the outside of your hip, thigh, and knee. The iliotibial band is a strip of tissue that runs from the outside of your hip and down your thigh to the outside of your knee.  Repeatedly bending and straightening your knee can irritate the iliotibial band.  What are the causes?  This condition is caused by inflammation and irritation from the friction of the iliotibial band moving over the thigh bone (femur) when you repeatedly bend and straighten your knee.  What increases the risk?  This condition is more likely to develop in people who:  · Frequently change elevation during their workouts.  · Run very long distances.  · Recently increased the length or intensity of their workouts.  · Run downhill often, or just started running downhill.  · Ride a bike very far or often.    You may also be at greater risk if you start a new workout routine without first warming up or if you have a job that requires you to bend, squat, or climb frequently.  What are the signs or symptoms?  Symptoms of this condition include:  · Pain along the outside of your knee that may be worse with activity, especially running or going up and down stairs.  · A “snapping” sensation over your knee.  · Swelling on the outside of your knee.  · Pain or a feeling of tightness in your hip.    How is this diagnosed?  This condition is diagnosed based on your symptoms, medical history, and physical exam. You may also see a health care provider who specializes in reducing pain and increasing mobility (physical therapist). A physical  therapist may do an exam to check your balance, movement, and way of walking or running (gait) to see whether the way you move could contribute to your injury. You may also have tests to measure your strength, flexibility, and range of motion.  How is this treated?  Treatment for this condition includes:  · Resting and limiting exercise.  · Returning to activities gradually.  · Doing range-of-motion and strengthening exercises (physical therapy) as told by your health care provider.  · Including low-impact activities, such as swimming, in your exercise routine.    Follow these instructions at home:  ·   If directed, apply ice to the injured area.  ? Put ice in a plastic bag.  ? Place a towel between your skin and the bag.  ? Leave the ice on for 20 minutes, 2-3 times per day.  · Return to your normal activities as told by your health care provider. Ask your health care provider what activities are safe for you.  · Keep all follow-up visits with your health care provider. This is important.  Contact a health care provider if:  · Your pain does not improve or gets worse despite treatment.  This information is not intended to replace advice given to you by your health care provider. Make sure you discuss any questions you have with your health care provider.  Document Released: 06/02/2002 Document Revised: 01/12/2017 Document Reviewed: 01/12/2017  Elsevier Interactive Patient Education © 2018 [employer]

## 2018-01-10 NOTE — Assessment & Plan Note (Signed)
On Metformin 

## 2018-01-10 NOTE — Assessment & Plan Note (Signed)
Labs

## 2018-01-10 NOTE — Assessment & Plan Note (Signed)
On B12 

## 2018-01-10 NOTE — Assessment & Plan Note (Signed)
On Hytrin, Coreg, Losatan

## 2018-01-10 NOTE — Progress Notes (Signed)
Subjective:  Patient ID: Louis Kemp, male    DOB: 01-07-1941  Age: 77 y.o. MRN: [id number]  CC: No chief complaint on file.   HPI Louis Kemp presents for B12 def, DM, HTN C/o R lat thigh pain x 3 wks - worse in am  Outpatient Medications Prior to Visit  Medication Sig Dispense Refill  . carvedilol (COREG) 12.5 MG tablet TAKE 1 TABLET (12.5 MG TOTAL) BY MOUTH 2 (TWO) TIMES DAILY WITH A MEAL. 180 tablet 2  . Cholecalciferol (VITAMIN D) 2000 UNITS CAPS Take 1 capsule by mouth every morning.    . cyanocobalamin (,VITAMIN B-12,) 1000 MCG/ML injection Inject 1 mL (1,000 mcg total) into the muscle every 14 (fourteen) days. 30 mL 6  . fenofibrate 160 MG tablet TAKE ONE TABLET BY MOUTH DAILY 90 tablet 2  . irbesartan (AVAPRO) 150 MG tablet Take 1 tablet (150 mg total) by mouth daily. 90 tablet 3  . metFORMIN (GLUCOPHAGE) 500 MG tablet Take 1 tablet (500 mg total) by mouth 3 (three) times daily. 270 tablet 3  . tamsulosin (FLOMAX) 0.4 MG CAPS capsule TAKE ONE CAPSULE BY MOUTH DAILY 67 capsule 2  . aspirin EC 81 MG tablet Take 81 mg by mouth daily.    Marland Kitchen losartan (COZAAR) 100 MG tablet Take 1 tablet (100 mg total) by mouth daily. 90 tablet 3   No facility-administered medications prior to visit.     ROS Review of Systems  Constitutional: Negative for appetite change, fatigue and unexpected weight change.  HENT: Negative for congestion, nosebleeds, sneezing, sore throat and trouble swallowing.   Eyes: Negative for itching and visual disturbance.  Respiratory: Negative for cough.   Cardiovascular: Negative for chest pain, palpitations and leg swelling.  Gastrointestinal: Negative for abdominal distention, blood in stool, diarrhea and nausea.  Genitourinary: Negative for frequency and hematuria.  Musculoskeletal: Positive for arthralgias. Negative for back pain, gait problem, joint swelling and neck pain.  Skin: Negative for rash.  Neurological: Negative for dizziness, tremors, speech  difficulty and weakness.  Psychiatric/Behavioral: Negative for agitation, dysphoric mood and sleep disturbance. The patient is not nervous/anxious.     Objective:  BP 140/80 (BP Location: Left Arm, Patient Position: Sitting, Cuff Size: Normal)   Pulse (!) 44   Temp 97.7 F (36.5 C) (Oral)   Ht 5\' 7"  (1.702 m)   Wt 144 lb (65.3 kg)   SpO2 98%   BMI 22.55 kg/m   BP Readings from Last 3 Encounters:  01/10/18 140/80  07/11/17 128/82  01/08/17 134/80    Wt Readings from Last 3 Encounters:  01/10/18 144 lb (65.3 kg)  07/11/17 145 lb (65.8 kg)  01/08/17 146 lb (66.2 kg)    Physical Exam  Constitutional: He is oriented to person, place, and time. He appears well-developed. No distress.  NAD  HENT:  Mouth/Throat: Oropharynx is clear and moist.  Eyes: Conjunctivae are normal. Pupils are equal, round, and reactive to light.  Neck: Normal range of motion. No JVD present. No thyromegaly present.  Cardiovascular: Normal rate, regular rhythm, normal heart sounds and intact distal pulses. Exam reveals no gallop and no friction rub.  No murmur heard. Pulmonary/Chest: Effort normal and breath sounds normal. No respiratory distress. He has no wheezes. He has no rales. He exhibits no tenderness.  Abdominal: Soft. Bowel sounds are normal. He exhibits no distension and no mass. There is no tenderness. There is no rebound and no guarding.  Musculoskeletal: Normal range of motion. He exhibits tenderness.  He exhibits no edema.  Lymphadenopathy:    He has no cervical adenopathy.  Neurological: He is alert and oriented to person, place, and time. He has normal reflexes. No cranial nerve deficit. He exhibits normal muscle tone. He displays a negative Romberg sign. Coordination and gait normal.  Skin: Skin is warm and dry. No rash noted.  Psychiatric: He has a normal mood and affect. His behavior is normal. Judgment and thought content normal.  R trochanteric bursa area is tender [place] leg elev is  (-) Hips w/good ROM B  Lab Results  Component Value Date   WBC 8.5 01/08/2017   HGB 13.8 01/08/2017   HCT 41.3 01/08/2017   PLT 322.0 01/08/2017   GLUCOSE 106 (H) 07/11/2017   CHOL 201 (H) 01/08/2017   TRIG 102.0 01/08/2017   HDL 45.50 01/08/2017   LDLCALC 135 (H) 01/08/2017   ALT 14 01/08/2017   AST 21 01/08/2017   NA 138 07/11/2017   K 4.7 07/11/2017   CL 101 07/11/2017   CREATININE 1.64 (H) 07/11/2017   BUN 22 07/11/2017   CO2 23 07/11/2017   TSH 1.93 01/08/2017   PSA 0.55 01/08/2017   INR 1.01 09/03/2014   HGBA1C 6.3 (H) 07/11/2017   MICROALBUR 0.8 01/08/2017    No results found.  Assessment & Plan:   There are no diagnoses linked to this encounter. I have discontinued Tsutomu K. Dewan's aspirin EC and losartan. I am also having him maintain his Vitamin D, cyanocobalamin, metFORMIN, fenofibrate, carvedilol, tamsulosin, and irbesartan.  No orders of the defined types were placed in this encounter.    Follow-up: No Follow-up on file.  Walker Kehr, MD

## 2018-01-10 NOTE — Assessment & Plan Note (Signed)
Aleve Stretching

## 2018-01-11 LAB — LIPID PANEL W/O CHOL/HDL RATIO
Cholesterol, Total: 213 mg/dL — ABNORMAL HIGH (ref 100–199)
HDL: 50 mg/dL (ref >39)
LDL Calculated: 141 mg/dL — ABNORMAL HIGH (ref 0–99)
Triglycerides: 110 mg/dL (ref 0–149)
VLDL Cholesterol Cal: 22 mg/dL (ref 5–40)

## 2018-01-11 LAB — URINALYSIS, ROUTINE W REFLEX MICROSCOPIC
Bilirubin, UA: NEGATIVE
Glucose, UA: NEGATIVE
Ketones, UA: NEGATIVE
Leukocytes, UA: NEGATIVE
Nitrite, UA: NEGATIVE
Protein, UA: NEGATIVE
RBC, UA: NEGATIVE
Specific Gravity, UA: 1.021 (ref 1.005–1.030)
Urobilinogen, Ur: 0.2 mg/dL (ref 0.2–1.0)
pH, UA: 5 (ref 5.0–7.5)

## 2018-01-11 LAB — CBC WITH DIFFERENTIAL/PLATELET
Basophils Absolute: 0.1 10*3/uL (ref 0.0–0.2)
Basos: 1 %
EOS (ABSOLUTE): 0.2 10*3/uL (ref 0.0–0.4)
Eos: 2 %
Hematocrit: 41.2 % (ref 37.5–51.0)
Hemoglobin: 14.2 g/dL (ref 13.0–17.7)
Immature Grans (Abs): 0 10*3/uL (ref 0.0–0.1)
Immature Granulocytes: 0 %
Lymphocytes Absolute: 3.9 10*3/uL — ABNORMAL HIGH (ref 0.7–3.1)
Lymphs: 49 %
MCH: 30.6 pg (ref 26.6–33.0)
MCHC: 34.5 g/dL (ref 31.5–35.7)
MCV: 89 fL (ref 79–97)
Monocytes Absolute: 0.5 10*3/uL (ref 0.1–0.9)
Monocytes: 7 %
Neutrophils Absolute: 3.2 10*3/uL (ref 1.4–7.0)
Neutrophils: 41 %
Platelets: 367 10*3/uL (ref 150–379)
RBC: 4.64 x10E6/uL (ref 4.14–5.80)
RDW: 13.3 % (ref 12.3–15.4)
WBC: 7.8 10*3/uL (ref 3.4–10.8)

## 2018-01-11 LAB — HEPATIC FUNCTION PANEL
ALT: 16 IU/L (ref 0–44)
AST: 24 IU/L (ref 0–40)
Albumin: 4.6 g/dL (ref 3.5–4.8)
Alkaline Phosphatase: 63 IU/L (ref 39–117)
Bilirubin Total: 0.4 mg/dL (ref 0.0–1.2)
Bilirubin, Direct: 0.17 mg/dL (ref 0.00–0.40)
Total Protein: 7.8 g/dL (ref 6.0–8.5)

## 2018-01-11 LAB — BASIC METABOLIC PANEL
BUN/Creatinine Ratio: 11 (ref 10–24)
BUN: 18 mg/dL (ref 8–27)
CO2: 22 mmol/L (ref 20–29)
Calcium: 10.2 mg/dL (ref 8.6–10.2)
Chloride: 103 mmol/L (ref 96–106)
Creatinine, Ser: 1.67 mg/dL — ABNORMAL HIGH (ref 0.76–1.27)
GFR calc Af Amer: 46 mL/min/{1.73_m2} — ABNORMAL LOW (ref >59)
GFR calc non Af Amer: 39 mL/min/{1.73_m2} — ABNORMAL LOW (ref >59)
Glucose: 97 mg/dL (ref 65–99)
Potassium: 5.2 mmol/L (ref 3.5–5.2)
Sodium: 143 mmol/L (ref 134–144)

## 2018-01-11 LAB — HGB A1C W/O EAG: Hgb A1c MFr Bld: 6.3 % — ABNORMAL HIGH (ref 4.8–5.6)

## 2018-01-11 LAB — PSA: Prostate Specific Ag, Serum: 0.6 ng/mL (ref 0.0–4.0)

## 2018-01-11 LAB — TSH: TSH: 2.23 u[IU]/mL (ref 0.450–4.500)

## 2018-03-15 ENCOUNTER — Other Ambulatory Visit: Payer: Self-pay | Admitting: Internal Medicine

## 2018-06-25 DIAGNOSIS — E119 Type 2 diabetes mellitus without complications: Secondary | ICD-10-CM | POA: Diagnosis not present

## 2018-06-25 DIAGNOSIS — H04123 Dry eye syndrome of bilateral lacrimal glands: Secondary | ICD-10-CM | POA: Diagnosis not present

## 2018-06-25 DIAGNOSIS — H40013 Open angle with borderline findings, low risk, bilateral: Secondary | ICD-10-CM | POA: Diagnosis not present

## 2018-06-25 DIAGNOSIS — H02051 Trichiasis without entropian right upper eyelid: Secondary | ICD-10-CM | POA: Diagnosis not present

## 2018-06-25 LAB — HM DIABETES EYE EXAM

## 2018-07-04 DIAGNOSIS — Z86010 Personal history of colonic polyps: Secondary | ICD-10-CM | POA: Diagnosis not present

## 2018-07-04 DIAGNOSIS — Z1211 Encounter for screening for malignant neoplasm of colon: Secondary | ICD-10-CM | POA: Diagnosis not present

## 2018-07-04 DIAGNOSIS — K5730 Diverticulosis of large intestine without perforation or abscess without bleeding: Secondary | ICD-10-CM | POA: Diagnosis not present

## 2018-07-11 ENCOUNTER — Encounter: Payer: Self-pay | Admitting: Internal Medicine

## 2018-07-11 ENCOUNTER — Other Ambulatory Visit: Payer: Self-pay | Admitting: Internal Medicine

## 2018-07-11 ENCOUNTER — Ambulatory Visit: Payer: Medicare Other | Admitting: Internal Medicine

## 2018-07-11 VITALS — BP 128/72 | HR 59 | Temp 98.1°F | Ht 67.0 in | Wt 142.0 lb

## 2018-07-11 DIAGNOSIS — R634 Abnormal weight loss: Secondary | ICD-10-CM | POA: Diagnosis not present

## 2018-07-11 DIAGNOSIS — E559 Vitamin D deficiency, unspecified: Secondary | ICD-10-CM | POA: Diagnosis not present

## 2018-07-11 DIAGNOSIS — R7989 Other specified abnormal findings of blood chemistry: Secondary | ICD-10-CM

## 2018-07-11 DIAGNOSIS — E538 Deficiency of other specified B group vitamins: Secondary | ICD-10-CM

## 2018-07-11 DIAGNOSIS — E785 Hyperlipidemia, unspecified: Secondary | ICD-10-CM

## 2018-07-11 DIAGNOSIS — E119 Type 2 diabetes mellitus without complications: Secondary | ICD-10-CM | POA: Diagnosis not present

## 2018-07-11 DIAGNOSIS — R748 Abnormal levels of other serum enzymes: Secondary | ICD-10-CM

## 2018-07-11 MED ORDER — CYANOCOBALAMIN 1000 MCG/ML IJ SOLN: 1000.0000 ug | INTRAMUSCULAR | 0 refills | Status: DC

## 2018-07-11 MED ORDER — AMLODIPINE BESYLATE 2.5 MG PO TABS: 2.5000 mg | ORAL_TABLET | Freq: Every day | ORAL | 3 refills | Status: DC

## 2018-07-11 NOTE — Assessment & Plan Note (Signed)
  On diet  

## 2018-07-11 NOTE — Assessment & Plan Note (Signed)
On metformin

## 2018-07-11 NOTE — Assessment & Plan Note (Signed)
Labs Norvasc [place]

## 2018-07-11 NOTE — Progress Notes (Signed)
Subjective:  Patient ID: Louis Kemp, male    DOB: Dec 28, 1940  Age: 77 y.o. MRN: [id number]  CC: No chief complaint on file.   HPI JERALD HENNINGTON presents for CRI, DM, HTN f/u  Outpatient Medications Prior to Visit  Medication Sig Dispense Refill  . carvedilol (COREG) 12.5 MG tablet TAKE 1 TABLET (12.5 MG TOTAL) BY MOUTH 2 (TWO) TIMES DAILY WITH A MEAL. 180 tablet 2  . Cholecalciferol (VITAMIN D) 2000 UNITS CAPS Take 1 capsule by mouth every morning.    . cyanocobalamin (,VITAMIN B-12,) 1000 MCG/ML injection Inject 1 mL (1,000 mcg total) into the muscle every 14 (fourteen) days. 30 mL 6  . fenofibrate 160 MG tablet TAKE ONE TABLET BY MOUTH DAILY 90 tablet 3  . irbesartan (AVAPRO) 150 MG tablet Take 1 tablet (150 mg total) by mouth daily. 90 tablet 3  . metFORMIN (GLUCOPHAGE) 500 MG tablet TAKE ONE TABLET BY MOUTH THREE TIMES A DAY (Patient taking differently: TAKE ONE TABLET BY MOUTH TWO TIMES A DAY) 270 tablet 3  . tamsulosin (FLOMAX) 0.4 MG CAPS capsule TAKE ONE CAPSULE BY MOUTH DAILY 67 capsule 2  . losartan (COZAAR) 100 MG tablet TAKE ONE TABLET BY MOUTH DAILY 90 tablet 3   No facility-administered medications prior to visit.     ROS: Review of Systems  Constitutional: Negative for appetite change, fatigue and unexpected weight change.  HENT: Negative for congestion, nosebleeds, sneezing, sore throat and trouble swallowing.   Eyes: Negative for itching and visual disturbance.  Respiratory: Negative for cough.   Cardiovascular: Negative for chest pain, palpitations and leg swelling.  Gastrointestinal: Negative for abdominal distention, blood in stool, diarrhea and nausea.  Genitourinary: Negative for frequency and hematuria.  Musculoskeletal: Positive for back pain. Negative for gait problem, joint swelling and neck pain.  Skin: Negative for rash.  Neurological: Negative for dizziness, tremors, speech difficulty and weakness.  Psychiatric/Behavioral: Negative for agitation,  dysphoric mood and sleep disturbance. The patient is not nervous/anxious.     Objective:  BP 128/72 (BP Location: Left Arm, Patient Position: Sitting, Cuff Size: Normal)   Pulse (!) 59   Temp 98.1 F (36.7 C) (Oral)   Ht 5\' 7"  (1.702 m)   Wt 142 lb (64.4 kg)   SpO2 97%   BMI 22.24 kg/m   BP Readings from Last 3 Encounters:  07/11/18 128/72  01/10/18 140/80  07/11/17 128/82    Wt Readings from Last 3 Encounters:  07/11/18 142 lb (64.4 kg)  01/10/18 144 lb (65.3 kg)  07/11/17 145 lb (65.8 kg)    Physical Exam  Constitutional: He is oriented to person, place, and time. He appears well-developed. No distress.  NAD  HENT:  Mouth/Throat: Oropharynx is clear and moist.  Eyes: Pupils are equal, round, and reactive to light. Conjunctivae are normal.  Neck: Normal range of motion. No JVD present. No thyromegaly present.  Cardiovascular: Normal rate, regular rhythm, normal heart sounds and intact distal pulses. Exam reveals no gallop and no friction rub.  No murmur heard. Pulmonary/Chest: Effort normal and breath sounds normal. No respiratory distress. He has no wheezes. He has no rales. He exhibits no tenderness.  Abdominal: Soft. Bowel sounds are normal. He exhibits no distension and no mass. There is no tenderness. There is no rebound and no guarding.  Musculoskeletal: Normal range of motion. He exhibits no edema or tenderness.  Lymphadenopathy:    He has no cervical adenopathy.  Neurological: He is alert and oriented to person,  place, and time. He has normal reflexes. No cranial nerve deficit. He exhibits normal muscle tone. He displays a negative Romberg sign. Coordination and gait normal.  Skin: Skin is warm and dry. No rash noted.  Psychiatric: He has a normal mood and affect. His behavior is normal. Judgment and thought content normal.    Lab Results  Component Value Date   WBC 7.8 01/10/2018   HGB 14.2 01/10/2018   HCT 41.2 01/10/2018   PLT 367 01/10/2018   GLUCOSE 97  01/10/2018   CHOL 213 (H) 01/10/2018   TRIG 110 01/10/2018   HDL 50 01/10/2018   LDLCALC 141 (H) 01/10/2018   ALT 16 01/10/2018   AST 24 01/10/2018   NA 143 01/10/2018   K 5.2 01/10/2018   CL 103 01/10/2018   CREATININE 1.67 (H) 01/10/2018   BUN 18 01/10/2018   CO2 22 01/10/2018   TSH 2.230 01/10/2018   PSA 0.55 01/08/2017   INR 1.01 09/03/2014   HGBA1C 6.3 (H) 01/10/2018   MICROALBUR 0.8 01/08/2017    No results found.  Assessment & Plan:    Walker Kehr, MD

## 2018-07-11 NOTE — Assessment & Plan Note (Signed)
On B12 

## 2018-07-11 NOTE — Assessment & Plan Note (Signed)
Wt Readings from Last 3 Encounters:  07/11/18 142 lb (64.4 kg)  01/10/18 144 lb (65.3 kg)  07/11/17 145 lb (65.8 kg)

## 2018-07-11 NOTE — Assessment & Plan Note (Signed)
On Vit D 

## 2018-07-12 ENCOUNTER — Other Ambulatory Visit: Payer: Self-pay

## 2018-07-12 DIAGNOSIS — E119 Type 2 diabetes mellitus without complications: Secondary | ICD-10-CM

## 2018-07-12 DIAGNOSIS — R7989 Other specified abnormal findings of blood chemistry: Secondary | ICD-10-CM

## 2018-07-15 ENCOUNTER — Other Ambulatory Visit: Payer: Self-pay | Admitting: Internal Medicine

## 2018-07-17 ENCOUNTER — Encounter: Payer: Self-pay | Admitting: Internal Medicine

## 2018-07-17 LAB — PROTEIN ELECTROPHORESIS, SERUM
Albumin ELP: 4.2 g/dL (ref 3.8–4.8)
Alpha 1: 0.3 g/dL (ref 0.2–0.3)
Alpha 2: 0.7 g/dL (ref 0.5–0.9)
Beta 2: 0.4 g/dL (ref 0.2–0.5)
Beta Globulin: 0.6 g/dL (ref 0.4–0.6)
Gamma Globulin: 1.1 g/dL (ref 0.8–1.7)
Total Protein: 7.4 g/dL (ref 6.1–8.1)

## 2018-07-17 LAB — SEDIMENTATION RATE: Sed Rate: 22 mm/h — ABNORMAL HIGH (ref 0–20)

## 2018-07-17 LAB — MICROALBUMIN / CREATININE URINE RATIO
Creatinine, Urine: 70 mg/dL (ref 20–320)
Microalb Creat Ratio: 7 mcg/mg creat (ref <30)
Microalb, Ur: 0.5 mg/dL

## 2018-07-19 DIAGNOSIS — K5730 Diverticulosis of large intestine without perforation or abscess without bleeding: Secondary | ICD-10-CM | POA: Diagnosis not present

## 2018-07-19 DIAGNOSIS — K6389 Other specified diseases of intestine: Secondary | ICD-10-CM | POA: Diagnosis not present

## 2018-07-19 DIAGNOSIS — K635 Polyp of colon: Secondary | ICD-10-CM | POA: Diagnosis not present

## 2018-07-19 DIAGNOSIS — Z86010 Personal history of colonic polyps: Secondary | ICD-10-CM | POA: Diagnosis not present

## 2018-07-19 DIAGNOSIS — Z1211 Encounter for screening for malignant neoplasm of colon: Secondary | ICD-10-CM | POA: Diagnosis not present

## 2018-07-19 DIAGNOSIS — D120 Benign neoplasm of cecum: Secondary | ICD-10-CM | POA: Diagnosis not present

## 2018-07-19 LAB — HM COLONOSCOPY

## 2018-07-29 NOTE — Addendum Note (Signed)
Addended by: Karren Cobble on: 07/29/2018 09:23 AM   Modules accepted: Orders

## 2018-07-30 ENCOUNTER — Ambulatory Visit
Admission: RE | Admit: 2018-07-30 | Discharge: 2018-07-30 | Disposition: A | Discharge status: Home / Self Care | Payer: Medicare Other | Source: Ambulatory Visit | Attending: Internal Medicine | Admitting: Internal Medicine

## 2018-07-30 ENCOUNTER — Other Ambulatory Visit: Payer: Medicare Other

## 2018-07-30 ENCOUNTER — Encounter (INDEPENDENT_AMBULATORY_CARE_PROVIDER_SITE_OTHER): Payer: Self-pay

## 2018-07-30 DIAGNOSIS — N281 Cyst of kidney, acquired: Secondary | ICD-10-CM | POA: Diagnosis not present

## 2018-07-30 DIAGNOSIS — R7989 Other specified abnormal findings of blood chemistry: Secondary | ICD-10-CM

## 2018-08-08 ENCOUNTER — Encounter: Payer: Self-pay | Admitting: Internal Medicine

## 2018-10-17 ENCOUNTER — Encounter: Payer: Self-pay | Admitting: Internal Medicine

## 2018-10-17 ENCOUNTER — Other Ambulatory Visit (INDEPENDENT_AMBULATORY_CARE_PROVIDER_SITE_OTHER): Payer: Medicare Other

## 2018-10-17 ENCOUNTER — Ambulatory Visit: Payer: Medicare Other | Admitting: Internal Medicine

## 2018-10-17 DIAGNOSIS — I10 Essential (primary) hypertension: Secondary | ICD-10-CM

## 2018-10-17 DIAGNOSIS — E785 Hyperlipidemia, unspecified: Secondary | ICD-10-CM | POA: Diagnosis not present

## 2018-10-17 DIAGNOSIS — E119 Type 2 diabetes mellitus without complications: Secondary | ICD-10-CM | POA: Diagnosis not present

## 2018-10-17 DIAGNOSIS — E538 Deficiency of other specified B group vitamins: Secondary | ICD-10-CM | POA: Diagnosis not present

## 2018-10-17 DIAGNOSIS — E559 Vitamin D deficiency, unspecified: Secondary | ICD-10-CM

## 2018-10-17 LAB — HEMOGLOBIN A1C: Hgb A1c MFr Bld: 6.5 % (ref 4.6–6.5)

## 2018-10-17 LAB — BASIC METABOLIC PANEL
BUN: 22 mg/dL (ref 6–23)
CO2: 28 mEq/L (ref 19–32)
Calcium: 9.5 mg/dL (ref 8.4–10.5)
Chloride: 102 mEq/L (ref 96–112)
Creatinine, Ser: 1.77 mg/dL — ABNORMAL HIGH (ref 0.40–1.50)
GFR: 39.9 mL/min — ABNORMAL LOW (ref >60.00)
Glucose, Bld: 109 mg/dL — ABNORMAL HIGH (ref 70–99)
Potassium: 4.4 mEq/L (ref 3.5–5.1)
Sodium: 138 mEq/L (ref 135–145)

## 2018-10-17 NOTE — Assessment & Plan Note (Signed)
On Hytrin, Coreg, Losatan

## 2018-10-17 NOTE — Assessment & Plan Note (Signed)
Metformin 

## 2018-10-17 NOTE — Assessment & Plan Note (Signed)
- 

## 2018-10-17 NOTE — Patient Instructions (Signed)

## 2018-10-17 NOTE — Assessment & Plan Note (Signed)
Vit D 

## 2018-10-17 NOTE — Assessment & Plan Note (Signed)
On B12 

## 2018-10-17 NOTE — Progress Notes (Signed)
Subjective:  Patient ID: Louis Kemp, male    DOB: [birth year]  Age: 77 y.o. MRN: [id number]  CC: No chief complaint on file.   HPI AYRTON MCVAY presents for DM, HTN, B12 def f/u  Outpatient Medications Prior to Visit  Medication Sig Dispense Refill  . amLODipine (NORVASC) 2.5 MG tablet Take 1 tablet (2.5 mg total) by mouth daily. 90 tablet 3  . carvedilol (COREG) 12.5 MG tablet TAKE ONE TABLET BY MOUTH TWICE A DAY WITH A MEAL 180 tablet 1  . Cholecalciferol (VITAMIN D) 2000 UNITS CAPS Take 1 capsule by mouth every morning.    . cyanocobalamin (,VITAMIN B-12,) 1000 MCG/ML injection Inject 1 mL (1,000 mcg total) into the muscle every 14 (fourteen) days. 30 mL 0  . fenofibrate 160 MG tablet TAKE ONE TABLET BY MOUTH DAILY 90 tablet 3  . metFORMIN (GLUCOPHAGE) 500 MG tablet TAKE ONE TABLET BY MOUTH THREE TIMES A DAY (Patient taking differently: TAKE ONE TABLET BY MOUTH TWO TIMES A DAY) 270 tablet 3  . tamsulosin (FLOMAX) 0.4 MG CAPS capsule TAKE ONE CAPSULE BY MOUTH DAILY 90 capsule 1   No facility-administered medications prior to visit.     ROS: Review of Systems  Constitutional: Negative for appetite change, fatigue and unexpected weight change.  HENT: Negative for congestion, nosebleeds, sneezing, sore throat and trouble swallowing.   Eyes: Negative for itching and visual disturbance.  Respiratory: Negative for cough.   Cardiovascular: Negative for chest pain, palpitations and leg swelling.  Gastrointestinal: Negative for abdominal distention, blood in stool, diarrhea and nausea.  Genitourinary: Negative for frequency and hematuria.  Musculoskeletal: Negative for back pain, gait problem, joint swelling and neck pain.  Skin: Negative for rash.  Neurological: Negative for dizziness, tremors, speech difficulty and weakness.  Psychiatric/Behavioral: Negative for agitation, dysphoric mood, sleep disturbance and suicidal ideas. The patient is not nervous/anxious.     Objective:    BP 122/64 (BP Location: Left Arm, Patient Position: Sitting, Cuff Size: Normal)   Pulse 63   Temp 97.7 F (36.5 C) (Oral)   Ht 5\' 7"  (1.702 m)   Wt 142 lb (64.4 kg)   SpO2 96%   BMI 22.24 kg/m   BP Readings from Last 3 Encounters:  10/17/18 122/64  07/11/18 128/72  01/10/18 140/80    Wt Readings from Last 3 Encounters:  10/17/18 142 lb (64.4 kg)  07/11/18 142 lb (64.4 kg)  01/10/18 144 lb (65.3 kg)    Physical Exam  Constitutional: He is oriented to person, place, and time. He appears well-developed. No distress.  NAD  HENT:  Mouth/Throat: Oropharynx is clear and moist.  Eyes: Pupils are equal, round, and reactive to light. Conjunctivae are normal.  Neck: Normal range of motion. No JVD present. No thyromegaly present.  Cardiovascular: Normal rate, regular rhythm, normal heart sounds and intact distal pulses. Exam reveals no gallop and no friction rub.  No murmur heard. Pulmonary/Chest: Effort normal and breath sounds normal. No respiratory distress. He has no wheezes. He has no rales. He exhibits no tenderness.  Abdominal: Soft. Bowel sounds are normal. He exhibits no distension and no mass. There is no tenderness. There is no rebound and no guarding.  Musculoskeletal: Normal range of motion. He exhibits no edema or tenderness.  Lymphadenopathy:    He has no cervical adenopathy.  Neurological: He is alert and oriented to person, place, and time. He has normal reflexes. No cranial nerve deficit. He exhibits normal muscle tone. He displays a  negative Romberg sign. Coordination and gait normal.  Skin: Skin is warm and dry. No rash noted.  Psychiatric: He has a normal mood and affect. His behavior is normal. Judgment and thought content normal.    Lab Results  Component Value Date   WBC 7.8 01/10/2018   HGB 14.2 01/10/2018   HCT 41.2 01/10/2018   PLT 367 01/10/2018   GLUCOSE 97 01/10/2018   CHOL 213 (H) 01/10/2018   TRIG 110 01/10/2018   HDL 50 01/10/2018   LDLCALC  141 (H) 01/10/2018   ALT 16 01/10/2018   AST 24 01/10/2018   NA 143 01/10/2018   K 5.2 01/10/2018   CL 103 01/10/2018   CREATININE 1.67 (H) 01/10/2018   BUN 18 01/10/2018   CO2 22 01/10/2018   TSH 2.230 01/10/2018   PSA 0.55 01/08/2017   INR 1.01 09/03/2014   HGBA1C 6.3 (H) 01/10/2018   MICROALBUR 0.5 07/11/2018    US Renal  Result Date: 07/30/2018 CLINICAL DATA:  Renal insufficiency EXAM: RENAL / URINARY TRACT ULTRASOUND COMPLETE COMPARISON:  CT abdomen and pelvis June 20, 2013 FINDINGS: Right Kidney: Length: 9.6 cm. Echogenicity is mildly increased. Renal cortical thickness is within normal limits. No perinephric fluid or hydronephrosis visualized. There is a cyst arising from the upper pole of the right kidney measuring 4.3 x 4.2 x 4.3 cm. No sonographically demonstrable calculus or ureterectasis. Left Kidney: Length: 10.4 cm. Echogenicity is mildly increased. Renal cortical thickness is within normal limits. No perinephric fluid or hydronephrosis visualized. There is a small cyst arising from the upper pole left kidney measuring 1.3 x 1.1 x 1.1 cm. There is a nearby small cyst in this area measuring 1.5 x 1.0 x 0.8 cm. No sonographically demonstrable calculus or ureterectasis. Bladder: Appears normal for degree of bladder distention. IMPRESSION: Kidneys show mild increase in renal echogenicity, a finding that may be indicative of medical renal disease. Renal cortical thickness normal bilaterally. No obstructing focus in either kidney. Small cysts noted in each kidney. Electronically Signed   By: Lowella Grip III M.D.   On: 07/30/2018 11:13    Assessment & Plan:   There are no diagnoses linked to this encounter.   No orders of the defined types were placed in this encounter.    Follow-up: No follow-ups on file.  Walker Kehr, MD

## 2018-12-30 ENCOUNTER — Other Ambulatory Visit: Payer: Self-pay | Admitting: Internal Medicine

## 2019-01-11 ENCOUNTER — Other Ambulatory Visit: Payer: Self-pay | Admitting: Internal Medicine

## 2019-04-01 ENCOUNTER — Other Ambulatory Visit: Payer: Self-pay | Admitting: Internal Medicine

## 2019-04-10 ENCOUNTER — Other Ambulatory Visit: Payer: Self-pay | Admitting: Internal Medicine

## 2019-04-22 ENCOUNTER — Ambulatory Visit: Payer: Medicare Other | Admitting: Internal Medicine

## 2019-07-01 DIAGNOSIS — H02051 Trichiasis without entropian right upper eyelid: Secondary | ICD-10-CM | POA: Diagnosis not present

## 2019-07-01 DIAGNOSIS — H40013 Open angle with borderline findings, low risk, bilateral: Secondary | ICD-10-CM | POA: Diagnosis not present

## 2019-07-01 DIAGNOSIS — H04123 Dry eye syndrome of bilateral lacrimal glands: Secondary | ICD-10-CM | POA: Diagnosis not present

## 2019-07-01 DIAGNOSIS — E119 Type 2 diabetes mellitus without complications: Secondary | ICD-10-CM | POA: Diagnosis not present

## 2019-07-02 ENCOUNTER — Other Ambulatory Visit (INDEPENDENT_AMBULATORY_CARE_PROVIDER_SITE_OTHER): Payer: Medicare Other

## 2019-07-02 ENCOUNTER — Ambulatory Visit (INDEPENDENT_AMBULATORY_CARE_PROVIDER_SITE_OTHER): Payer: Medicare Other | Admitting: Internal Medicine

## 2019-07-02 ENCOUNTER — Encounter: Payer: Self-pay | Admitting: Internal Medicine

## 2019-07-02 ENCOUNTER — Other Ambulatory Visit: Payer: Self-pay

## 2019-07-02 VITALS — BP 128/76 | HR 62 | Temp 97.9°F | Ht 67.0 in | Wt 143.0 lb

## 2019-07-02 DIAGNOSIS — E785 Hyperlipidemia, unspecified: Secondary | ICD-10-CM

## 2019-07-02 DIAGNOSIS — E538 Deficiency of other specified B group vitamins: Secondary | ICD-10-CM | POA: Diagnosis not present

## 2019-07-02 DIAGNOSIS — N320 Bladder-neck obstruction: Secondary | ICD-10-CM | POA: Diagnosis not present

## 2019-07-02 DIAGNOSIS — E559 Vitamin D deficiency, unspecified: Secondary | ICD-10-CM | POA: Diagnosis not present

## 2019-07-02 DIAGNOSIS — E119 Type 2 diabetes mellitus without complications: Secondary | ICD-10-CM | POA: Diagnosis not present

## 2019-07-02 DIAGNOSIS — R7989 Other specified abnormal findings of blood chemistry: Secondary | ICD-10-CM

## 2019-07-02 LAB — BASIC METABOLIC PANEL
BUN: 16 mg/dL (ref 6–23)
CO2: 22 mEq/L (ref 19–32)
Calcium: 9.4 mg/dL (ref 8.4–10.5)
Chloride: 105 mEq/L (ref 96–112)
Creatinine, Ser: 1.53 mg/dL — ABNORMAL HIGH (ref 0.40–1.50)
GFR: 44.33 mL/min — ABNORMAL LOW (ref >60.00)
Glucose, Bld: 118 mg/dL — ABNORMAL HIGH (ref 70–99)
Potassium: 4.7 mEq/L (ref 3.5–5.1)
Sodium: 140 mEq/L (ref 135–145)

## 2019-07-02 LAB — URINALYSIS
Bilirubin Urine: NEGATIVE
Hgb urine dipstick: NEGATIVE
Ketones, ur: NEGATIVE
Leukocytes,Ua: NEGATIVE
Nitrite: NEGATIVE
Specific Gravity, Urine: 1.015 (ref 1.000–1.030)
Total Protein, Urine: NEGATIVE
Urine Glucose: NEGATIVE
Urobilinogen, UA: 0.2 (ref 0.0–1.0)
pH: 5 (ref 5.0–8.0)

## 2019-07-02 LAB — CBC WITH DIFFERENTIAL/PLATELET
Basophils Absolute: 0.1 10*3/uL (ref 0.0–0.1)
Basophils Relative: 1 % (ref 0.0–3.0)
Eosinophils Absolute: 0.2 10*3/uL (ref 0.0–0.7)
Eosinophils Relative: 2.2 % (ref 0.0–5.0)
HCT: 42 % (ref 39.0–52.0)
Hemoglobin: 14 g/dL (ref 13.0–17.0)
Lymphocytes Relative: 49.9 % — ABNORMAL HIGH (ref 12.0–46.0)
Lymphs Abs: 3.9 10*3/uL (ref 0.7–4.0)
MCHC: 33.4 g/dL (ref 30.0–36.0)
MCV: 90.9 fl (ref 78.0–100.0)
Monocytes Absolute: 0.7 10*3/uL (ref 0.1–1.0)
Monocytes Relative: 8.6 % (ref 3.0–12.0)
Neutro Abs: 3 10*3/uL (ref 1.4–7.7)
Neutrophils Relative %: 38.3 % — ABNORMAL LOW (ref 43.0–77.0)
Platelets: 351 10*3/uL (ref 150.0–400.0)
RBC: 4.62 Mil/uL (ref 4.22–5.81)
RDW: 13.5 % (ref 11.5–15.5)
WBC: 7.9 10*3/uL (ref 4.0–10.5)

## 2019-07-02 LAB — HEPATIC FUNCTION PANEL
ALT: 16 U/L (ref 0–53)
AST: 21 U/L (ref 0–37)
Albumin: 4.6 g/dL (ref 3.5–5.2)
Alkaline Phosphatase: 56 U/L (ref 39–117)
Bilirubin, Direct: 0.1 mg/dL (ref 0.0–0.3)
Total Bilirubin: 0.5 mg/dL (ref 0.2–1.2)
Total Protein: 7.6 g/dL (ref 6.0–8.3)

## 2019-07-02 LAB — PSA: PSA: 0.5 ng/mL (ref 0.10–4.00)

## 2019-07-02 LAB — LIPID PANEL
Cholesterol: 185 mg/dL (ref 0–200)
HDL: 45.9 mg/dL (ref >39.00)
LDL Cholesterol: 120 mg/dL — ABNORMAL HIGH (ref 0–99)
NonHDL: 139.48
Total CHOL/HDL Ratio: 4
Triglycerides: 98 mg/dL (ref 0.0–149.0)
VLDL: 19.6 mg/dL (ref 0.0–40.0)

## 2019-07-02 LAB — TSH: TSH: 1.51 u[IU]/mL (ref 0.35–4.50)

## 2019-07-02 LAB — MICROALBUMIN / CREATININE URINE RATIO
Creatinine,U: 50.2 mg/dL
Microalb Creat Ratio: 2.6 mg/g (ref 0.0–30.0)
Microalb, Ur: 1.3 mg/dL (ref 0.0–1.9)

## 2019-07-02 LAB — HEMOGLOBIN A1C: Hgb A1c MFr Bld: 6.4 % (ref 4.6–6.5)

## 2019-07-02 LAB — VITAMIN D 25 HYDROXY (VIT D DEFICIENCY, FRACTURES): VITD: 55.96 ng/mL (ref 30.00–100.00)

## 2019-07-02 MED ORDER — METFORMIN HCL 500 MG PO TABS: 500.0000 mg | ORAL_TABLET | Freq: Three times a day (TID) | ORAL | 3 refills | Status: DC

## 2019-07-02 MED ORDER — CARVEDILOL 12.5 MG PO TABS: ORAL_TABLET | ORAL | 3 refills | Status: DC

## 2019-07-02 MED ORDER — AMLODIPINE BESYLATE 2.5 MG PO TABS: 2.5000 mg | ORAL_TABLET | Freq: Every day | ORAL | 3 refills | Status: DC

## 2019-07-02 MED ORDER — TAMSULOSIN HCL 0.4 MG PO CAPS: 0.4000 mg | ORAL_CAPSULE | Freq: Every day | ORAL | 3 refills | Status: DC

## 2019-07-02 MED ORDER — CYANOCOBALAMIN 1000 MCG/ML IJ SOLN: 1000.0000 ug | INTRAMUSCULAR | 3 refills | Status: DC

## 2019-07-02 NOTE — Assessment & Plan Note (Signed)
Labs

## 2019-07-02 NOTE — Assessment & Plan Note (Addendum)
On metformin Labs

## 2019-07-02 NOTE — Progress Notes (Signed)
Subjective:  Patient ID: Louis Kemp, male    DOB: Jul 25, 1941  Age: 78 y.o. MRN: [id number]  CC: No chief complaint on file.   HPI DWAIN HUHN presents for DM, B12, HTN f/u  Outpatient Medications Prior to Visit  Medication Sig Dispense Refill  . amLODipine (NORVASC) 2.5 MG tablet Take 1 tablet (2.5 mg total) by mouth daily. 90 tablet 3  . carvedilol (COREG) 12.5 MG tablet TAKE ONE TABLET BY MOUTH TWICE A DAY WITH A MEAL 180 tablet 3  . Cholecalciferol (VITAMIN D) 2000 UNITS CAPS Take 1 capsule by mouth every morning.    . cyanocobalamin (,VITAMIN B-12,) 1000 MCG/ML injection Inject 1 mL (1,000 mcg total) into the muscle every 14 (fourteen) days. 30 mL 0  . fenofibrate 160 MG tablet TAKE ONE TABLET BY MOUTH DAILY 90 tablet 3  . metFORMIN (GLUCOPHAGE) 500 MG tablet TAKE ONE TABLET BY MOUTH THREE TIMES A DAY 270 tablet 1  . tamsulosin (FLOMAX) 0.4 MG CAPS capsule TAKE ONE CAPSULE BY MOUTH DAILY 90 capsule 3   No facility-administered medications prior to visit.     ROS: Review of Systems  Constitutional: Negative for appetite change, fatigue and unexpected weight change.  HENT: Negative for congestion, nosebleeds, sneezing, sore throat and trouble swallowing.   Eyes: Negative for itching and visual disturbance.  Respiratory: Negative for cough.   Cardiovascular: Negative for chest pain, palpitations and leg swelling.  Gastrointestinal: Negative for abdominal distention, blood in stool, diarrhea and nausea.  Genitourinary: Negative for frequency and hematuria.  Musculoskeletal: Negative for back pain, gait problem, joint swelling and neck pain.  Skin: Negative for rash.  Neurological: Negative for dizziness, tremors, speech difficulty and weakness.  Psychiatric/Behavioral: Negative for agitation, dysphoric mood, sleep disturbance and suicidal ideas. The patient is not nervous/anxious.     Objective:  BP 128/76 (BP Location: Left Arm, Patient Position: Sitting, Cuff Size:  Normal)   Pulse 62   Temp 97.9 F (36.6 C) (Oral)   Ht 5\' 7"  (1.702 m)   Wt 143 lb (64.9 kg)   SpO2 98%   BMI 22.40 kg/m   BP Readings from Last 3 Encounters:  07/02/19 128/76  10/17/18 122/64  07/11/18 128/72    Wt Readings from Last 3 Encounters:  07/02/19 143 lb (64.9 kg)  10/17/18 142 lb (64.4 kg)  07/11/18 142 lb (64.4 kg)    Physical Exam Constitutional:      General: He is not in acute distress.    Appearance: He is well-developed.     Comments: NAD  Eyes:     Conjunctiva/sclera: Conjunctivae normal.     Pupils: Pupils are equal, round, and reactive to light.  Neck:     Musculoskeletal: Normal range of motion.     Thyroid: No thyromegaly.     Vascular: No JVD.  Cardiovascular:     Rate and Rhythm: Normal rate and regular rhythm.     Heart sounds: Normal heart sounds. No murmur. No friction rub. No gallop.   Pulmonary:     Effort: Pulmonary effort is normal. No respiratory distress.     Breath sounds: Normal breath sounds. No wheezing or rales.  Chest:     Chest wall: No tenderness.  Abdominal:     General: Bowel sounds are normal. There is no distension.     Palpations: Abdomen is soft. There is no mass.     Tenderness: There is no abdominal tenderness. There is no guarding or rebound.  Musculoskeletal: Normal  range of motion.        General: No tenderness.  Lymphadenopathy:     Cervical: No cervical adenopathy.  Skin:    General: Skin is warm and dry.     Findings: No rash.  Neurological:     Mental Status: He is alert and oriented to person, place, and time.     Cranial Nerves: No cranial nerve deficit.     Motor: No abnormal muscle tone.     Coordination: Coordination normal.     Gait: Gait normal.     Deep Tendon Reflexes: Reflexes are normal and symmetric.  Psychiatric:        Behavior: Behavior normal.        Thought Content: Thought content normal.        Judgment: Judgment normal.     Lab Results  Component Value Date   WBC 7.8  01/10/2018   HGB 14.2 01/10/2018   HCT 41.2 01/10/2018   PLT 367 01/10/2018   GLUCOSE 109 (H) 10/17/2018   CHOL 213 (H) 01/10/2018   TRIG 110 01/10/2018   HDL 50 01/10/2018   LDLCALC 141 (H) 01/10/2018   ALT 16 01/10/2018   AST 24 01/10/2018   NA 138 10/17/2018   K 4.4 10/17/2018   CL 102 10/17/2018   CREATININE 1.77 (H) 10/17/2018   BUN 22 10/17/2018   CO2 28 10/17/2018   TSH 2.230 01/10/2018   PSA 0.55 01/08/2017   INR 1.01 09/03/2014   HGBA1C 6.5 10/17/2018   MICROALBUR 0.5 07/11/2018    US Renal  Result Date: 07/30/2018 CLINICAL DATA:  Renal insufficiency EXAM: RENAL / URINARY TRACT ULTRASOUND COMPLETE COMPARISON:  CT abdomen and pelvis June 20, 2013 FINDINGS: Right Kidney: Length: 9.6 cm. Echogenicity is mildly increased. Renal cortical thickness is within normal limits. No perinephric fluid or hydronephrosis visualized. There is a cyst arising from the upper pole of the right kidney measuring 4.3 x 4.2 x 4.3 cm. No sonographically demonstrable calculus or ureterectasis. Left Kidney: Length: 10.4 cm. Echogenicity is mildly increased. Renal cortical thickness is within normal limits. No perinephric fluid or hydronephrosis visualized. There is a small cyst arising from the upper pole left kidney measuring 1.3 x 1.1 x 1.1 cm. There is a nearby small cyst in this area measuring 1.5 x 1.0 x 0.8 cm. No sonographically demonstrable calculus or ureterectasis. Bladder: Appears normal for degree of bladder distention. IMPRESSION: Kidneys show mild increase in renal echogenicity, a finding that may be indicative of medical renal disease. Renal cortical thickness normal bilaterally. No obstructing focus in either kidney. Small cysts noted in each kidney. Electronically Signed   By: Lowella Grip III M.D.   On: 07/30/2018 11:13    Assessment & Plan:   There are no diagnoses linked to this encounter.   No orders of the defined types were placed in this encounter.    Follow-up: No  follow-ups on file.  Walker Kehr, MD

## 2019-07-02 NOTE — Assessment & Plan Note (Signed)
- 

## 2019-07-02 NOTE — Assessment & Plan Note (Signed)
On Vit D 

## 2019-07-02 NOTE — Assessment & Plan Note (Signed)
On B12 

## 2019-07-08 ENCOUNTER — Telehealth: Payer: Self-pay | Admitting: Internal Medicine

## 2019-07-08 NOTE — Telephone Encounter (Signed)
Called patient to schedule AWV; no answer. Will try to call patient at a later time. SF

## 2019-07-31 DIAGNOSIS — H02051 Trichiasis without entropian right upper eyelid: Secondary | ICD-10-CM | POA: Diagnosis not present

## 2019-07-31 DIAGNOSIS — H26492 Other secondary cataract, left eye: Secondary | ICD-10-CM | POA: Diagnosis not present

## 2019-07-31 DIAGNOSIS — E119 Type 2 diabetes mellitus without complications: Secondary | ICD-10-CM | POA: Diagnosis not present

## 2019-07-31 DIAGNOSIS — H40013 Open angle with borderline findings, low risk, bilateral: Secondary | ICD-10-CM | POA: Diagnosis not present

## 2019-07-31 DIAGNOSIS — H52223 Regular astigmatism, bilateral: Secondary | ICD-10-CM | POA: Diagnosis not present

## 2019-08-28 DIAGNOSIS — H26491 Other secondary cataract, right eye: Secondary | ICD-10-CM | POA: Diagnosis not present

## 2019-09-26 DIAGNOSIS — H02051 Trichiasis without entropian right upper eyelid: Secondary | ICD-10-CM | POA: Diagnosis not present

## 2019-09-26 DIAGNOSIS — Z961 Presence of intraocular lens: Secondary | ICD-10-CM | POA: Diagnosis not present

## 2019-09-26 DIAGNOSIS — H16103 Unspecified superficial keratitis, bilateral: Secondary | ICD-10-CM | POA: Diagnosis not present

## 2019-09-30 DIAGNOSIS — H16103 Unspecified superficial keratitis, bilateral: Secondary | ICD-10-CM | POA: Diagnosis not present

## 2019-09-30 DIAGNOSIS — H02051 Trichiasis without entropian right upper eyelid: Secondary | ICD-10-CM | POA: Diagnosis not present

## 2019-09-30 DIAGNOSIS — H02054 Trichiasis without entropian left upper eyelid: Secondary | ICD-10-CM | POA: Diagnosis not present

## 2019-10-07 NOTE — Progress Notes (Addendum)
Subjective:   Louis Kemp is a 78 y.o. male who presents for an Initial Medicare Annual Wellness Visit. I connected with patient by a telephone and verified that I am speaking with the correct person using two identifiers. Patient stated full name and DOB. Patient gave permission to continue with telephonic visit. Patient's location was at home and Nurse's location was at [place] office.   Review of Systems   Cardiac Risk Factors include: advanced age (>5men, >69 women);diabetes mellitus;dyslipidemia;male gender;hypertension Sleep patterns: feels rested on waking, gets up 1-2 times nightly to void and sleeps 8-9 hours nightly.    Home Safety/Smoke Alarms: Feels safe in home. Smoke alarms in place.  Living environment; residence and Firearm Safety: 1-story house/ trailer. Lives with wife, no needs for DME, good support system Seat Belt Safety/Bike Helmet: Wears seat belt.    Objective:    There were no vitals filed for this visit. There is no height or weight on file to calculate BMI.  Advanced Directives 10/08/2019 09/14/2014 09/03/2014 06/15/2014 06/15/2014 06/10/2014  Does Patient Have a Medical Advance Directive? Yes Yes Yes - Patient has advance directive, copy in chart Patient has advance directive, copy in chart  Type of Advance Directive [place];Living will [place];Living will [place];Living will Living will Living will Living will  Copy of [place] in Chart? No - copy requested - Yes - - -  Pre-existing out of facility DNR order (yellow form or pink MOST form) - - - No - -    Current Medications (verified) Outpatient Encounter Medications as of 10/08/2019  Medication Sig  . amLODipine (NORVASC) 2.5 MG tablet Take 1 tablet (2.5 mg total) by mouth daily.  . carvedilol (COREG) 12.5 MG tablet TAKE ONE TABLET BY MOUTH TWICE A DAY WITH A MEAL  . Cholecalciferol (VITAMIN D) 2000 UNITS CAPS Take 1  capsule by mouth every morning.  . cyanocobalamin (,VITAMIN B-12,) 1000 MCG/ML injection Inject 1 mL (1,000 mcg total) into the muscle every 14 (fourteen) days.  . fenofibrate 160 MG tablet TAKE ONE TABLET BY MOUTH DAILY  . metFORMIN (GLUCOPHAGE) 500 MG tablet Take 1 tablet (500 mg total) by mouth 3 (three) times daily.  . tamsulosin (FLOMAX) 0.4 MG CAPS capsule Take 1 capsule (0.4 mg total) by mouth daily.   No facility-administered encounter medications on file as of 10/08/2019.     Allergies (verified) Atorvastatin and Rosuvastatin   History: Past Medical History:  Diagnosis Date  . BPH (benign prostatic hypertrophy)   . Hyperlipidemia   . Hypertension   . LBP (low back pain)   . MVP (mitral valve prolapse)   . Osteoarthritis   . PAC (premature atrial contraction)   . Statin intolerance   . Type II or unspecified type diabetes mellitus without mention of complication, not stated as uncontrolled 2011  . Vitamin B 12 deficiency 2011  . Vitamin D deficiency   . Vitiligo    Past Surgical History:  Procedure Laterality Date  . arm surgery Left    ORIF left  forearm  . CATARACT EXTRACTION  2012  . HERNIA REPAIR Bilateral 1971  . PARTIAL KNEE ARTHROPLASTY Left 09/14/2014   Procedure: LEFT UNICOMPARTMENTAL KNEE ARTHROPLASTY MEDIALLY;  Surgeon: Mauri Pole, MD;  Location: [facility];  Service: Orthopedics;  Laterality: Left;  . TOTAL KNEE ARTHROPLASTY Right 06/15/2014   Procedure: RIGHT TOTAL KNEE ARTHROPLASTY;  Surgeon: Mauri Pole, MD;  Location: [facility];  Service: Orthopedics;  Laterality: Right;   Family History  Problem Relation Age of Onset  . Arthritis Mother   . Hypertension Father   . Coronary artery disease Father   . Hypertension Brother   . Hypertension Other   . Hypertension Brother   . Coronary artery disease Other    Social History   Socioeconomic History  . Marital status: Married    Spouse name: Not on file  . Number of children: Not on file  . Years  of education: Not on file  . Highest education level: Not on file  Occupational History  . Occupation: MD Retired  Scientific laboratory technician  . Financial resource strain: Not hard at all  . Food insecurity    Worry: Never true    Inability: Never true  . Transportation needs    Medical: No    Non-medical: No  Tobacco Use  . Smoking status: Former Smoker    Types: Cigars  . Smokeless tobacco: Never Used  Substance and Sexual Activity  . Alcohol use: Yes    Comment: 3-4 mixed drinks week  . Drug use: No    Comment: 3-4 cigars   year  . Sexual activity: Not Currently  Lifestyle  . Physical activity    Days per week: 3 days    Minutes per session: 30 min  . Stress: Not at all  Relationships  . Social connections    Talks on phone: More than three times a week    Gets together: More than three times a week    Attends religious service: 1 to 4 times per year    Active member of club or organization: Yes    Attends meetings of clubs or organizations: 1 to 4 times per year    Relationship status: Married  Other Topics Concern  . Not on file  Social History Narrative   Regular Exercise -  YES, golf   Tobacco Counseling Counseling given: Not Answered  Activities of Daily Living In your present state of health, do you have any difficulty performing the following activities: 10/08/2019  Hearing? N  Vision? N  Difficulty concentrating or making decisions? N  Walking or climbing stairs? N  Dressing or bathing? N  Doing errands, shopping? N  Preparing Food and eating ? N  Using the Toilet? N  In the past six months, have you accidently leaked urine? N  Do you have problems with loss of bowel control? N  Managing your Medications? N  Managing your Finances? N  Housekeeping or managing your Housekeeping? N  Some recent data might be hidden     Immunizations and Health Maintenance Immunization History  Administered Date(s) Administered  . Influenza Split 10/20/2011  . Influenza  Whole 10/20/2010, 09/14/2012  . Influenza, High Dose Seasonal PF 09/26/2018  . Influenza,inj,Quad PF,6+ Mos 09/30/2014  . Influenza-Unspecified 09/17/2013, 10/26/2015, 10/25/2016  . Pneumococcal Conjugate-13 11/24/2013  . Pneumococcal Polysaccharide-23 07/14/2010  . Td 03/25/2014  . Zoster 07/14/2010  . Zoster Recombinat (Shingrix) 04/03/2018, 06/07/2018   Health Maintenance Due  Topic Date Due  . FOOT EXAM  04/02/1951  . OPHTHALMOLOGY EXAM  06/26/2019  . INFLUENZA VACCINE  07/26/2019    Patient Care Team: [name], Evie Lacks, MD as PCP - General Calvert Cantor, MD (Ophthalmology) Juanita Craver, MD as Attending Physician (Gastroenterology) Paralee Cancel, MD as Attending Physician (Orthopedic Surgery) Josue Hector, MD as Consulting Physician (Cardiology)  Indicate any recent Medical Services you may have received from other than [facility] providers  in the past year (date may be approximate).    Assessment:   This is a routine wellness examination for [place]. Physical assessment deferred to PCP.   Hearing/Vision screen  Hearing Screening   125Hz  250Hz  500Hz  1000Hz  2000Hz  3000Hz  4000Hz  6000Hz  8000Hz   Right ear:           Left ear:           Comments: Able to hear conversational tones w/o difficulty. No issues reported.    Vision Screening Comments: appointment yearly Dr. Bing Plume  Dietary issues and exercise activities discussed: Current Exercise Habits: Home exercise routine, Time (Minutes): 30, Frequency (Times/Week): 3, Weekly Exercise (Minutes/Week): 90, Intensity: Mild, Exercise limited by: None identified  Diet (meal preparation, eat out, water intake, caffeinated beverages, dairy products, fruits and vegetables): in general, a "healthy" diet  , well balanced   Goals    . Patient Stated     Increase the amount of physical activity I am doing until I can safely return to the [facility].      Depression Screen PHQ 2/9 Scores 10/08/2019 10/17/2018 07/11/2017 07/07/2016  PHQ -  2 Score 0 0 0 0    Fall Risk Fall Risk  10/08/2019 10/17/2018 07/11/2017 07/07/2016 06/25/2015  Falls in the past year? 0 No No No No  Number falls in past yr: 0 - - - -  Injury with Fall? 0 - - - -    Is the patient's home free of loose throw rugs in walkways, pet beds, electrical cords, etc?   yes      Grab bars in the bathroom? yes      Handrails on the stairs?   yes      Adequate lighting?   yes   Cognitive Function:       Ad8 score reviewed for issues:  Issues making decisions: no  Less interest in hobbies / activities: no  Repeats questions, stories (family complaining): no  Trouble using ordinary gadgets (microwave, computer, phone):no  Forgets the month or year: no  Mismanaging finances: no  Remembering appts: no  Daily problems with thinking and/or memory: no Ad8 score is= 0   Screening Tests Health Maintenance  Topic Date Due  . FOOT EXAM  04/02/1951  . OPHTHALMOLOGY EXAM  06/26/2019  . INFLUENZA VACCINE  07/26/2019  . HEMOGLOBIN A1C  01/02/2020  . URINE MICROALBUMIN  07/01/2020  . TETANUS/TDAP  03/25/2024  . PNA vac Low Risk Adult  Completed      Plan:    Reviewed health maintenance screenings with patient today and relevant education, vaccines, and/or referrals were provided.   I have personally reviewed and noted the following in the patient's chart:   . Medical and social history . Use of alcohol, tobacco or illicit drugs  . Current medications and supplements . Functional ability and status . Nutritional status . Physical activity . Advanced directives . List of other physicians . Screenings to include cognitive, depression, and falls . Referrals and appointments  In addition, I have reviewed and discussed with patient certain preventive protocols, quality metrics, and best practice recommendations. A written personalized care plan for preventive services as well as general preventive health recommendations were provided to patient.      Michiel Cowboy, RN   10/08/2019    Medical screening examination/treatment/procedure(s) were performed by non-physician practitioner and as supervising physician I was immediately available for consultation/collaboration. I agree with above. Lew Dawes, MD

## 2019-10-08 ENCOUNTER — Ambulatory Visit (INDEPENDENT_AMBULATORY_CARE_PROVIDER_SITE_OTHER): Payer: Medicare Other | Admitting: *Deleted

## 2019-10-08 DIAGNOSIS — Z0000 Encounter for general adult medical examination without abnormal findings: Secondary | ICD-10-CM | POA: Diagnosis not present

## 2019-11-03 ENCOUNTER — Other Ambulatory Visit (INDEPENDENT_AMBULATORY_CARE_PROVIDER_SITE_OTHER): Payer: Medicare Other

## 2019-11-03 ENCOUNTER — Other Ambulatory Visit: Payer: Self-pay

## 2019-11-03 ENCOUNTER — Encounter: Payer: Self-pay | Admitting: Internal Medicine

## 2019-11-03 ENCOUNTER — Ambulatory Visit (INDEPENDENT_AMBULATORY_CARE_PROVIDER_SITE_OTHER): Payer: Medicare Other | Admitting: Internal Medicine

## 2019-11-03 DIAGNOSIS — E559 Vitamin D deficiency, unspecified: Secondary | ICD-10-CM | POA: Diagnosis not present

## 2019-11-03 DIAGNOSIS — E785 Hyperlipidemia, unspecified: Secondary | ICD-10-CM | POA: Diagnosis not present

## 2019-11-03 DIAGNOSIS — I10 Essential (primary) hypertension: Secondary | ICD-10-CM

## 2019-11-03 DIAGNOSIS — Z0000 Encounter for general adult medical examination without abnormal findings: Secondary | ICD-10-CM | POA: Diagnosis not present

## 2019-11-03 DIAGNOSIS — E119 Type 2 diabetes mellitus without complications: Secondary | ICD-10-CM

## 2019-11-03 LAB — BASIC METABOLIC PANEL
BUN: 15 mg/dL (ref 6–23)
CO2: 25 mEq/L (ref 19–32)
Calcium: 9.6 mg/dL (ref 8.4–10.5)
Chloride: 104 mEq/L (ref 96–112)
Creatinine, Ser: 1.67 mg/dL — ABNORMAL HIGH (ref 0.40–1.50)
GFR: 40.03 mL/min — ABNORMAL LOW (ref >60.00)
Glucose, Bld: 120 mg/dL — ABNORMAL HIGH (ref 70–99)
Potassium: 4.3 mEq/L (ref 3.5–5.1)
Sodium: 137 mEq/L (ref 135–145)

## 2019-11-03 LAB — VITAMIN D 25 HYDROXY (VIT D DEFICIENCY, FRACTURES): VITD: 52.33 ng/mL (ref 30.00–100.00)

## 2019-11-03 LAB — HEMOGLOBIN A1C: Hgb A1c MFr Bld: 6.5 % (ref 4.6–6.5)

## 2019-11-03 NOTE — Assessment & Plan Note (Signed)
On metformin

## 2019-11-03 NOTE — Progress Notes (Signed)
Subjective:  Patient ID: Louis Kemp, male    DOB: 1941-07-15  Age: 78 y.o. MRN: [id number]  CC: No chief complaint on file.   HPI Louis Kemp presents for DM, B12 def, HTN f/u  Outpatient Medications Prior to Visit  Medication Sig Dispense Refill  . amLODipine (NORVASC) 2.5 MG tablet Take 1 tablet (2.5 mg total) by mouth daily. 90 tablet 3  . carvedilol (COREG) 12.5 MG tablet TAKE ONE TABLET BY MOUTH TWICE A DAY WITH A MEAL 180 tablet 3  . Cholecalciferol (VITAMIN D) 2000 UNITS CAPS Take 1 capsule by mouth every morning.    . cyanocobalamin (,VITAMIN B-12,) 1000 MCG/ML injection Inject 1 mL (1,000 mcg total) into the muscle every 14 (fourteen) days. 30 mL 3  . fenofibrate 160 MG tablet TAKE ONE TABLET BY MOUTH DAILY 90 tablet 3  . metFORMIN (GLUCOPHAGE) 500 MG tablet Take 1 tablet (500 mg total) by mouth 3 (three) times daily. 270 tablet 3  . tamsulosin (FLOMAX) 0.4 MG CAPS capsule Take 1 capsule (0.4 mg total) by mouth daily. 90 capsule 3   No facility-administered medications prior to visit.     ROS: Review of Systems  Constitutional: Negative for appetite change, fatigue and unexpected weight change.  HENT: Negative for congestion, nosebleeds, sneezing, sore throat and trouble swallowing.   Eyes: Negative for itching and visual disturbance.  Respiratory: Negative for cough.   Cardiovascular: Negative for chest pain, palpitations and leg swelling.  Gastrointestinal: Negative for abdominal distention, blood in stool, diarrhea and nausea.  Genitourinary: Negative for frequency and hematuria.  Musculoskeletal: Negative for back pain, gait problem, joint swelling and neck pain.  Skin: Negative for rash.  Neurological: Negative for dizziness, tremors, speech difficulty and weakness.  Psychiatric/Behavioral: Negative for agitation, dysphoric mood, sleep disturbance and suicidal ideas. The patient is not nervous/anxious.     Objective:  BP 118/62 (BP Location: Left Arm,  Patient Position: Sitting, Cuff Size: Normal)   Pulse (!) 54   Temp 97.8 F (36.6 C) (Oral)   Ht 5\' 7"  (1.702 m)   Wt 140 lb (63.5 kg)   SpO2 97%   BMI 21.93 kg/m   BP Readings from Last 3 Encounters:  11/03/19 118/62  07/02/19 128/76  10/17/18 122/64    Wt Readings from Last 3 Encounters:  11/03/19 140 lb (63.5 kg)  07/02/19 143 lb (64.9 kg)  10/17/18 142 lb (64.4 kg)    Physical Exam Constitutional:      General: He is not in acute distress.    Appearance: He is well-developed.     Comments: NAD  Eyes:     Conjunctiva/sclera: Conjunctivae normal.     Pupils: Pupils are equal, round, and reactive to light.  Neck:     Musculoskeletal: Normal range of motion.     Thyroid: No thyromegaly.     Vascular: No JVD.  Cardiovascular:     Rate and Rhythm: Normal rate and regular rhythm.     Heart sounds: Normal heart sounds. No murmur. No friction rub. No gallop.   Pulmonary:     Effort: Pulmonary effort is normal. No respiratory distress.     Breath sounds: Normal breath sounds. No wheezing or rales.  Chest:     Chest wall: No tenderness.  Abdominal:     General: Bowel sounds are normal. There is no distension.     Palpations: Abdomen is soft. There is no mass.     Tenderness: There is no abdominal tenderness. There  is no guarding or rebound.  Musculoskeletal: Normal range of motion.        General: No tenderness.  Lymphadenopathy:     Cervical: No cervical adenopathy.  Skin:    General: Skin is warm and dry.     Findings: No rash.  Neurological:     Mental Status: He is alert and oriented to person, place, and time.     Cranial Nerves: No cranial nerve deficit.     Motor: No abnormal muscle tone.     Coordination: Coordination normal.     Gait: Gait normal.     Deep Tendon Reflexes: Reflexes are normal and symmetric.  Psychiatric:        Behavior: Behavior normal.        Thought Content: Thought content normal.        Judgment: Judgment normal.     Lab  Results  Component Value Date   WBC 7.9 07/02/2019   HGB 14.0 07/02/2019   HCT 42.0 07/02/2019   PLT 351.0 07/02/2019   GLUCOSE 118 (H) 07/02/2019   CHOL 185 07/02/2019   TRIG 98.0 07/02/2019   HDL 45.90 07/02/2019   LDLCALC 120 (H) 07/02/2019   ALT 16 07/02/2019   AST 21 07/02/2019   NA 140 07/02/2019   K 4.7 07/02/2019   CL 105 07/02/2019   CREATININE 1.53 (H) 07/02/2019   BUN 16 07/02/2019   CO2 22 07/02/2019   TSH 1.51 07/02/2019   PSA 0.50 07/02/2019   INR 1.01 09/03/2014   HGBA1C 6.4 07/02/2019   MICROALBUR 1.3 07/02/2019    US Renal  Result Date: 07/30/2018 CLINICAL DATA:  Renal insufficiency EXAM: RENAL / URINARY TRACT ULTRASOUND COMPLETE COMPARISON:  CT abdomen and pelvis June 20, 2013 FINDINGS: Right Kidney: Length: 9.6 cm. Echogenicity is mildly increased. Renal cortical thickness is within normal limits. No perinephric fluid or hydronephrosis visualized. There is a cyst arising from the upper pole of the right kidney measuring 4.3 x 4.2 x 4.3 cm. No sonographically demonstrable calculus or ureterectasis. Left Kidney: Length: 10.4 cm. Echogenicity is mildly increased. Renal cortical thickness is within normal limits. No perinephric fluid or hydronephrosis visualized. There is a small cyst arising from the upper pole left kidney measuring 1.3 x 1.1 x 1.1 cm. There is a nearby small cyst in this area measuring 1.5 x 1.0 x 0.8 cm. No sonographically demonstrable calculus or ureterectasis. Bladder: Appears normal for degree of bladder distention. IMPRESSION: Kidneys show mild increase in renal echogenicity, a finding that may be indicative of medical renal disease. Renal cortical thickness normal bilaterally. No obstructing focus in either kidney. Small cysts noted in each kidney. Electronically Signed   By: Lowella Grip III M.D.   On: 07/30/2018 11:13    Assessment & Plan:   There are no diagnoses linked to this encounter.   No orders of the defined types were placed  in this encounter.    Follow-up: No follow-ups on file.  Walker Kehr, MD

## 2019-11-03 NOTE — Assessment & Plan Note (Signed)
On Vit D 

## 2019-11-03 NOTE — Assessment & Plan Note (Signed)
- 

## 2019-12-31 DIAGNOSIS — H02883 Meibomian gland dysfunction of right eye, unspecified eyelid: Secondary | ICD-10-CM | POA: Diagnosis not present

## 2019-12-31 DIAGNOSIS — H11111 Conjunctival deposits, right eye: Secondary | ICD-10-CM | POA: Diagnosis not present

## 2019-12-31 DIAGNOSIS — H16223 Keratoconjunctivitis sicca, not specified as Sjogren's, bilateral: Secondary | ICD-10-CM | POA: Diagnosis not present

## 2019-12-31 DIAGNOSIS — H02886 Meibomian gland dysfunction of left eye, unspecified eyelid: Secondary | ICD-10-CM | POA: Diagnosis not present

## 2020-01-05 DIAGNOSIS — H02105 Unspecified ectropion of left lower eyelid: Secondary | ICD-10-CM | POA: Diagnosis not present

## 2020-01-05 DIAGNOSIS — H40013 Open angle with borderline findings, low risk, bilateral: Secondary | ICD-10-CM | POA: Diagnosis not present

## 2020-01-05 DIAGNOSIS — H04123 Dry eye syndrome of bilateral lacrimal glands: Secondary | ICD-10-CM | POA: Diagnosis not present

## 2020-01-05 DIAGNOSIS — E119 Type 2 diabetes mellitus without complications: Secondary | ICD-10-CM | POA: Diagnosis not present

## 2020-01-09 ENCOUNTER — Ambulatory Visit: Payer: Medicare Other | Attending: Internal Medicine

## 2020-01-09 DIAGNOSIS — Z23 Encounter for immunization: Secondary | ICD-10-CM | POA: Insufficient documentation

## 2020-01-09 NOTE — Progress Notes (Signed)
   Covid-19 Vaccination Clinic  Name:  Louis Kemp    MRN: [id number] DOB: [birth year]  01/09/2020  [name] was observed post Covid-19 immunization for 15 minutes without incidence. He was provided with Vaccine Information Sheet and instruction to access the V-Safe system.   [name] was instructed to call 911 with any severe reactions post vaccine: Marland Kitchen Difficulty breathing  . Swelling of your face and throat  . A fast heartbeat  . A bad rash all over your body  . Dizziness and weakness    Immunizations Administered    Name Date Dose VIS Date Route   Pfizer COVID-19 Vaccine 01/09/2020  8:46 AM 0.3 mL 12/05/2019 Intramuscular   Manufacturer: [place]   Lot: [id number]   [place]: [id number]

## 2020-01-16 DIAGNOSIS — H16143 Punctate keratitis, bilateral: Secondary | ICD-10-CM | POA: Diagnosis not present

## 2020-01-23 DIAGNOSIS — H16103 Unspecified superficial keratitis, bilateral: Secondary | ICD-10-CM | POA: Diagnosis not present

## 2020-01-23 DIAGNOSIS — H5713 Ocular pain, bilateral: Secondary | ICD-10-CM | POA: Diagnosis not present

## 2020-01-23 DIAGNOSIS — H10503 Unspecified blepharoconjunctivitis, bilateral: Secondary | ICD-10-CM | POA: Diagnosis not present

## 2020-01-23 DIAGNOSIS — H16143 Punctate keratitis, bilateral: Secondary | ICD-10-CM | POA: Diagnosis not present

## 2020-01-23 DIAGNOSIS — H11233 Symblepharon, bilateral: Secondary | ICD-10-CM | POA: Diagnosis not present

## 2020-01-26 DIAGNOSIS — L121 Cicatricial pemphigoid: Secondary | ICD-10-CM

## 2020-01-26 HISTORY — DX: Cicatricial pemphigoid: L12.1

## 2020-01-27 ENCOUNTER — Ambulatory Visit: Payer: Medicare Other | Attending: Internal Medicine

## 2020-01-27 DIAGNOSIS — H018 Other specified inflammations of eyelid: Secondary | ICD-10-CM | POA: Diagnosis not present

## 2020-01-27 DIAGNOSIS — H11233 Symblepharon, bilateral: Secondary | ICD-10-CM | POA: Diagnosis not present

## 2020-01-27 DIAGNOSIS — L121 Cicatricial pemphigoid: Secondary | ICD-10-CM | POA: Diagnosis not present

## 2020-01-27 DIAGNOSIS — H16223 Keratoconjunctivitis sicca, not specified as Sjogren's, bilateral: Secondary | ICD-10-CM | POA: Diagnosis not present

## 2020-01-27 DIAGNOSIS — Z961 Presence of intraocular lens: Secondary | ICD-10-CM | POA: Diagnosis not present

## 2020-01-27 DIAGNOSIS — Z23 Encounter for immunization: Secondary | ICD-10-CM | POA: Insufficient documentation

## 2020-01-27 NOTE — Progress Notes (Signed)
   Covid-19 Vaccination Clinic  Name:  ALP FAUCETTE    MRN: [id number] DOB: 11-13-1941  01/27/2020  Mr. Vanderkooi was observed post Covid-19 immunization for 15 minutes without incidence. He was provided with Vaccine Information Sheet and instruction to access the V-Safe system.   Mr. Eisenberg was instructed to call 911 with any severe reactions post vaccine: Marland Kitchen Difficulty breathing  . Swelling of your face and throat  . A fast heartbeat  . A bad rash all over your body  . Dizziness and weakness    Immunizations Administered    Name Date Dose VIS Date Route   Pfizer COVID-19 Vaccine 01/27/2020  8:18 AM 0.3 mL 12/05/2019 Intramuscular   Manufacturer: [place]   Lot: [id number]   [place]: [id number]

## 2020-01-30 DIAGNOSIS — L121 Cicatricial pemphigoid: Secondary | ICD-10-CM | POA: Diagnosis not present

## 2020-02-03 DIAGNOSIS — H11233 Symblepharon, bilateral: Secondary | ICD-10-CM | POA: Diagnosis not present

## 2020-02-03 DIAGNOSIS — L121 Cicatricial pemphigoid: Secondary | ICD-10-CM | POA: Diagnosis not present

## 2020-02-03 DIAGNOSIS — H16223 Keratoconjunctivitis sicca, not specified as Sjogren's, bilateral: Secondary | ICD-10-CM | POA: Diagnosis not present

## 2020-02-03 DIAGNOSIS — Z961 Presence of intraocular lens: Secondary | ICD-10-CM | POA: Diagnosis not present

## 2020-02-27 DIAGNOSIS — L121 Cicatricial pemphigoid: Secondary | ICD-10-CM | POA: Diagnosis not present

## 2020-02-27 DIAGNOSIS — H16223 Keratoconjunctivitis sicca, not specified as Sjogren's, bilateral: Secondary | ICD-10-CM | POA: Diagnosis not present

## 2020-02-27 DIAGNOSIS — Z961 Presence of intraocular lens: Secondary | ICD-10-CM | POA: Diagnosis not present

## 2020-02-27 DIAGNOSIS — H11233 Symblepharon, bilateral: Secondary | ICD-10-CM | POA: Diagnosis not present

## 2020-03-12 DIAGNOSIS — S0502XA Injury of conjunctiva and corneal abrasion without foreign body, left eye, initial encounter: Secondary | ICD-10-CM | POA: Diagnosis not present

## 2020-03-15 DIAGNOSIS — H16143 Punctate keratitis, bilateral: Secondary | ICD-10-CM | POA: Diagnosis not present

## 2020-03-15 DIAGNOSIS — H11233 Symblepharon, bilateral: Secondary | ICD-10-CM | POA: Diagnosis not present

## 2020-03-15 DIAGNOSIS — H16103 Unspecified superficial keratitis, bilateral: Secondary | ICD-10-CM | POA: Diagnosis not present

## 2020-03-15 DIAGNOSIS — H10503 Unspecified blepharoconjunctivitis, bilateral: Secondary | ICD-10-CM | POA: Diagnosis not present

## 2020-03-16 DIAGNOSIS — H11233 Symblepharon, bilateral: Secondary | ICD-10-CM | POA: Diagnosis not present

## 2020-03-16 DIAGNOSIS — L121 Cicatricial pemphigoid: Secondary | ICD-10-CM | POA: Diagnosis not present

## 2020-04-02 ENCOUNTER — Other Ambulatory Visit: Payer: Self-pay | Admitting: Internal Medicine

## 2020-04-06 DIAGNOSIS — L121 Cicatricial pemphigoid: Secondary | ICD-10-CM | POA: Diagnosis not present

## 2020-04-22 DIAGNOSIS — D120 Benign neoplasm of cecum: Secondary | ICD-10-CM | POA: Diagnosis not present

## 2020-04-22 DIAGNOSIS — K5730 Diverticulosis of large intestine without perforation or abscess without bleeding: Secondary | ICD-10-CM | POA: Diagnosis not present

## 2020-04-30 DIAGNOSIS — H16103 Unspecified superficial keratitis, bilateral: Secondary | ICD-10-CM | POA: Diagnosis not present

## 2020-04-30 DIAGNOSIS — H5212 Myopia, left eye: Secondary | ICD-10-CM | POA: Diagnosis not present

## 2020-04-30 DIAGNOSIS — H16143 Punctate keratitis, bilateral: Secondary | ICD-10-CM | POA: Diagnosis not present

## 2020-04-30 DIAGNOSIS — H17822 Peripheral opacity of cornea, left eye: Secondary | ICD-10-CM | POA: Diagnosis not present

## 2020-05-03 ENCOUNTER — Ambulatory Visit: Payer: Medicare Other | Admitting: Internal Medicine

## 2020-05-05 DIAGNOSIS — H11233 Symblepharon, bilateral: Secondary | ICD-10-CM | POA: Diagnosis not present

## 2020-05-05 DIAGNOSIS — L121 Cicatricial pemphigoid: Secondary | ICD-10-CM | POA: Diagnosis not present

## 2020-05-05 DIAGNOSIS — H16223 Keratoconjunctivitis sicca, not specified as Sjogren's, bilateral: Secondary | ICD-10-CM | POA: Diagnosis not present

## 2020-05-06 DIAGNOSIS — H11233 Symblepharon, bilateral: Secondary | ICD-10-CM | POA: Diagnosis not present

## 2020-05-10 ENCOUNTER — Other Ambulatory Visit: Payer: Self-pay

## 2020-05-10 ENCOUNTER — Encounter: Payer: Self-pay | Admitting: Internal Medicine

## 2020-05-10 ENCOUNTER — Ambulatory Visit (INDEPENDENT_AMBULATORY_CARE_PROVIDER_SITE_OTHER): Payer: Medicare Other | Admitting: Internal Medicine

## 2020-05-10 DIAGNOSIS — N183 Chronic kidney disease, stage 3 unspecified: Secondary | ICD-10-CM | POA: Diagnosis not present

## 2020-05-10 DIAGNOSIS — L129 Pemphigoid, unspecified: Secondary | ICD-10-CM | POA: Diagnosis not present

## 2020-05-10 DIAGNOSIS — E538 Deficiency of other specified B group vitamins: Secondary | ICD-10-CM

## 2020-05-10 MED ORDER — TRAMADOL HCL 50 MG PO TABS: 50.0000 mg | ORAL_TABLET | Freq: Four times a day (QID) | ORAL | 0 refills | Status: AC | PRN

## 2020-05-10 MED ORDER — PREDNISONE 10 MG PO TABS: ORAL_TABLET | ORAL | 1 refills | Status: DC

## 2020-05-10 NOTE — Patient Instructions (Signed)
Sign up for [employer] library ( via Libby app on your phone or your ipad). If you don't have a library card  - go to any library branch. They will set you up in 15 minutes. It is free. You can check out books to read and to listen, check out magazines and newspapers, movies etc.   

## 2020-05-10 NOTE — Assessment & Plan Note (Addendum)
Occular pemphigoid - [facility] - f/u w/Dr Sharen Counter  On Cellcept Prednisone 10 mg: take 4 tabs a day x 3 days; then 3 tabs a day x 4 days; then 2 tabs a day x 4 days, then 1 tab a day x 6 days, then stop. Take pc. Tramadol prn  Potential benefits of a short/long term opioids use as well as potential risks (i.e. addiction risk, apnea etc) and complications (i.e. Somnolence, constipation and others) were explained to the patient and were aknowledged. D/c Aleve

## 2020-05-10 NOTE — Progress Notes (Signed)
Subjective:  Patient ID: Louis Kemp, male    DOB: February 19, 1941  Age: 79 y.o. MRN: [id number]  CC: No chief complaint on file.   HPI GARROD SCALZITTI presents for DM, HTN, B12 def f/u C/o severe eye discomfort and photophobia due to his ophthalmic pemphigoid flare up. Using Aleve On Cellcept since Feb 2021 15000 mg/d. Remicaid is considered  Outpatient Medications Prior to Visit  Medication Sig Dispense Refill  . amLODipine (NORVASC) 2.5 MG tablet Take 1 tablet (2.5 mg total) by mouth daily. 90 tablet 3  . carvedilol (COREG) 12.5 MG tablet TAKE ONE TABLET BY MOUTH TWICE A DAY WITH A MEAL 180 tablet 3  . Cholecalciferol (VITAMIN D) 2000 UNITS CAPS Take 1 capsule by mouth every morning.    . cyanocobalamin (,VITAMIN B-12,) 1000 MCG/ML injection Inject 1 mL (1,000 mcg total) into the muscle every 14 (fourteen) days. 30 mL 3  . fenofibrate 160 MG tablet TAKE ONE TABLET BY MOUTH DAILY 90 tablet 3  . metFORMIN (GLUCOPHAGE) 500 MG tablet Take 1 tablet (500 mg total) by mouth 3 (three) times daily. 270 tablet 3  . mycophenolate (CELLCEPT) 500 MG tablet Take by mouth.    . tamsulosin (FLOMAX) 0.4 MG CAPS capsule Take 1 capsule (0.4 mg total) by mouth daily. 90 capsule 3   No facility-administered medications prior to visit.    ROS: Review of Systems  Constitutional: Negative for appetite change, fatigue and unexpected weight change.  HENT: Negative for congestion, nosebleeds, sneezing, sore throat and trouble swallowing.   Eyes: Positive for photophobia, pain and redness. Negative for itching and visual disturbance.  Respiratory: Negative for cough.   Cardiovascular: Negative for chest pain, palpitations and leg swelling.  Gastrointestinal: Negative for abdominal distention, blood in stool, diarrhea and nausea.  Genitourinary: Negative for frequency and hematuria.  Musculoskeletal: Negative for back pain, gait problem, joint swelling and neck pain.  Skin: Negative for rash.  Neurological:  Negative for dizziness, tremors, speech difficulty and weakness.  Psychiatric/Behavioral: Negative for agitation, dysphoric mood and sleep disturbance. The patient is not nervous/anxious.     Objective:  BP 132/80 (BP Location: Left Arm, Patient Position: Sitting, Cuff Size: Normal)   Pulse 70   Temp 98.1 F (36.7 C) (Oral)   Ht 5\' 7"  (1.702 m)   Wt 137 lb (62.1 kg)   SpO2 99%   BMI 21.46 kg/m   BP Readings from Last 3 Encounters:  05/10/20 132/80  11/03/19 118/62  07/02/19 128/76    Wt Readings from Last 3 Encounters:  05/10/20 137 lb (62.1 kg)  11/03/19 140 lb (63.5 kg)  07/02/19 143 lb (64.9 kg)    Physical Exam Constitutional:      General: He is in acute distress.     Appearance: He is well-developed.     Comments: NAD  Eyes:     General:        Right eye: Discharge present.        Left eye: Discharge present.    Conjunctiva/sclera: Conjunctivae normal.     Pupils: Pupils are equal, round, and reactive to light.  Neck:     Thyroid: No thyromegaly.     Vascular: No JVD.  Cardiovascular:     Rate and Rhythm: Normal rate and regular rhythm.     Heart sounds: Normal heart sounds. No murmur. No friction rub. No gallop.   Pulmonary:     Effort: Pulmonary effort is normal. No respiratory distress.     Breath  sounds: Normal breath sounds. No wheezing or rales.  Chest:     Chest wall: No tenderness.  Abdominal:     General: Bowel sounds are normal. There is no distension.     Palpations: Abdomen is soft. There is no mass.     Tenderness: There is no abdominal tenderness. There is no guarding or rebound.  Musculoskeletal:        General: No tenderness. Normal range of motion.     Cervical back: Normal range of motion.  Lymphadenopathy:     Cervical: No cervical adenopathy.  Skin:    General: Skin is warm and dry.     Findings: No rash.  Neurological:     Mental Status: He is alert and oriented to person, place, and time.     Cranial Nerves: No cranial nerve  deficit.     Motor: No abnormal muscle tone.     Coordination: Coordination normal.     Gait: Gait normal.     Deep Tendon Reflexes: Reflexes are normal and symmetric.  Psychiatric:        Behavior: Behavior normal.        Thought Content: Thought content normal.        Judgment: Judgment normal.   red eyes L>R, tearing a lot  Lab Results  Component Value Date   WBC 7.9 07/02/2019   HGB 14.0 07/02/2019   HCT 42.0 07/02/2019   PLT 351.0 07/02/2019   GLUCOSE 120 (H) 11/03/2019   CHOL 185 07/02/2019   TRIG 98.0 07/02/2019   HDL 45.90 07/02/2019   LDLCALC 120 (H) 07/02/2019   ALT 16 07/02/2019   AST 21 07/02/2019   NA 137 11/03/2019   K 4.3 11/03/2019   CL 104 11/03/2019   CREATININE 1.67 (H) 11/03/2019   BUN 15 11/03/2019   CO2 25 11/03/2019   TSH 1.51 07/02/2019   PSA 0.50 07/02/2019   INR 1.01 09/03/2014   HGBA1C 6.5 11/03/2019   MICROALBUR 1.3 07/02/2019    US Renal  Result Date: 07/30/2018 CLINICAL DATA:  Renal insufficiency EXAM: RENAL / URINARY TRACT ULTRASOUND COMPLETE COMPARISON:  CT abdomen and pelvis June 20, 2013 FINDINGS: Right Kidney: Length: 9.6 cm. Echogenicity is mildly increased. Renal cortical thickness is within normal limits. No perinephric fluid or hydronephrosis visualized. There is a cyst arising from the upper pole of the right kidney measuring 4.3 x 4.2 x 4.3 cm. No sonographically demonstrable calculus or ureterectasis. Left Kidney: Length: 10.4 cm. Echogenicity is mildly increased. Renal cortical thickness is within normal limits. No perinephric fluid or hydronephrosis visualized. There is a small cyst arising from the upper pole left kidney measuring 1.3 x 1.1 x 1.1 cm. There is a nearby small cyst in this area measuring 1.5 x 1.0 x 0.8 cm. No sonographically demonstrable calculus or ureterectasis. Bladder: Appears normal for degree of bladder distention. IMPRESSION: Kidneys show mild increase in renal echogenicity, a finding that may be indicative of  medical renal disease. Renal cortical thickness normal bilaterally. No obstructing focus in either kidney. Small cysts noted in each kidney. Electronically Signed   By: Lowella Grip III M.D.   On: 07/30/2018 11:13    Assessment & Plan:     Follow-up: No follow-ups on file.  Walker Kehr, MD

## 2020-05-11 ENCOUNTER — Encounter: Payer: Self-pay | Admitting: Internal Medicine

## 2020-05-11 DIAGNOSIS — N183 Chronic kidney disease, stage 3 unspecified: Secondary | ICD-10-CM | POA: Insufficient documentation

## 2020-05-11 NOTE — Assessment & Plan Note (Signed)
On B12 

## 2020-05-11 NOTE — Assessment & Plan Note (Signed)
D/c Aleve

## 2020-05-18 ENCOUNTER — Other Ambulatory Visit: Payer: Self-pay | Admitting: *Deleted

## 2020-05-18 MED ORDER — TAMSULOSIN HCL 0.4 MG PO CAPS: 0.4000 mg | ORAL_CAPSULE | Freq: Every day | ORAL | 3 refills | Status: DC

## 2020-05-20 MED ORDER — AMLODIPINE BESYLATE 2.5 MG PO TABS: 2.5000 mg | ORAL_TABLET | Freq: Every day | ORAL | 3 refills | Status: DC

## 2020-05-20 NOTE — Addendum Note (Signed)
Addended by: Cresenciano Lick on: 05/20/2020 08:58 AM   Modules accepted: Orders

## 2020-05-26 ENCOUNTER — Other Ambulatory Visit: Payer: Self-pay | Admitting: *Deleted

## 2020-05-26 MED ORDER — FENOFIBRATE 160 MG PO TABS: 160.0000 mg | ORAL_TABLET | Freq: Every day | ORAL | 3 refills | Status: DC

## 2020-05-26 MED ORDER — CYANOCOBALAMIN 1000 MCG/ML IJ SOLN: 1000.0000 ug | INTRAMUSCULAR | 3 refills | Status: DC

## 2020-05-26 MED ORDER — CARVEDILOL 12.5 MG PO TABS: ORAL_TABLET | ORAL | 3 refills | Status: DC

## 2020-05-26 MED ORDER — METFORMIN HCL 500 MG PO TABS: 500.0000 mg | ORAL_TABLET | Freq: Three times a day (TID) | ORAL | 3 refills | Status: DC

## 2020-06-01 DIAGNOSIS — H18832 Recurrent erosion of cornea, left eye: Secondary | ICD-10-CM | POA: Diagnosis not present

## 2020-06-01 DIAGNOSIS — H16103 Unspecified superficial keratitis, bilateral: Secondary | ICD-10-CM | POA: Diagnosis not present

## 2020-06-01 DIAGNOSIS — H11233 Symblepharon, bilateral: Secondary | ICD-10-CM | POA: Diagnosis not present

## 2020-06-01 DIAGNOSIS — H16143 Punctate keratitis, bilateral: Secondary | ICD-10-CM | POA: Diagnosis not present

## 2020-06-07 DIAGNOSIS — H11233 Symblepharon, bilateral: Secondary | ICD-10-CM | POA: Diagnosis not present

## 2020-06-07 DIAGNOSIS — H18832 Recurrent erosion of cornea, left eye: Secondary | ICD-10-CM | POA: Diagnosis not present

## 2020-06-07 DIAGNOSIS — H16143 Punctate keratitis, bilateral: Secondary | ICD-10-CM | POA: Diagnosis not present

## 2020-06-07 DIAGNOSIS — H16103 Unspecified superficial keratitis, bilateral: Secondary | ICD-10-CM | POA: Diagnosis not present

## 2020-06-08 DIAGNOSIS — H11233 Symblepharon, bilateral: Secondary | ICD-10-CM | POA: Diagnosis not present

## 2020-06-08 DIAGNOSIS — H18832 Recurrent erosion of cornea, left eye: Secondary | ICD-10-CM | POA: Diagnosis not present

## 2020-06-08 DIAGNOSIS — H16143 Punctate keratitis, bilateral: Secondary | ICD-10-CM | POA: Diagnosis not present

## 2020-06-08 DIAGNOSIS — H16103 Unspecified superficial keratitis, bilateral: Secondary | ICD-10-CM | POA: Diagnosis not present

## 2020-06-11 DIAGNOSIS — H11233 Symblepharon, bilateral: Secondary | ICD-10-CM | POA: Diagnosis not present

## 2020-06-11 DIAGNOSIS — H18832 Recurrent erosion of cornea, left eye: Secondary | ICD-10-CM | POA: Diagnosis not present

## 2020-06-11 DIAGNOSIS — H16143 Punctate keratitis, bilateral: Secondary | ICD-10-CM | POA: Diagnosis not present

## 2020-06-11 DIAGNOSIS — H16103 Unspecified superficial keratitis, bilateral: Secondary | ICD-10-CM | POA: Diagnosis not present

## 2020-06-15 DIAGNOSIS — H16103 Unspecified superficial keratitis, bilateral: Secondary | ICD-10-CM | POA: Diagnosis not present

## 2020-06-15 DIAGNOSIS — H18832 Recurrent erosion of cornea, left eye: Secondary | ICD-10-CM | POA: Diagnosis not present

## 2020-06-15 DIAGNOSIS — H16143 Punctate keratitis, bilateral: Secondary | ICD-10-CM | POA: Diagnosis not present

## 2020-06-18 DIAGNOSIS — H18832 Recurrent erosion of cornea, left eye: Secondary | ICD-10-CM | POA: Diagnosis not present

## 2020-06-18 DIAGNOSIS — H16143 Punctate keratitis, bilateral: Secondary | ICD-10-CM | POA: Diagnosis not present

## 2020-06-25 DIAGNOSIS — H16103 Unspecified superficial keratitis, bilateral: Secondary | ICD-10-CM | POA: Diagnosis not present

## 2020-06-25 DIAGNOSIS — H18832 Recurrent erosion of cornea, left eye: Secondary | ICD-10-CM | POA: Diagnosis not present

## 2020-06-25 DIAGNOSIS — H16143 Punctate keratitis, bilateral: Secondary | ICD-10-CM | POA: Diagnosis not present

## 2020-06-25 DIAGNOSIS — H11233 Symblepharon, bilateral: Secondary | ICD-10-CM | POA: Diagnosis not present

## 2020-07-02 ENCOUNTER — Other Ambulatory Visit: Payer: Self-pay | Admitting: Internal Medicine

## 2020-07-02 DIAGNOSIS — H18832 Recurrent erosion of cornea, left eye: Secondary | ICD-10-CM | POA: Diagnosis not present

## 2020-07-02 DIAGNOSIS — H16143 Punctate keratitis, bilateral: Secondary | ICD-10-CM | POA: Diagnosis not present

## 2020-07-02 DIAGNOSIS — H16103 Unspecified superficial keratitis, bilateral: Secondary | ICD-10-CM | POA: Diagnosis not present

## 2020-07-13 DIAGNOSIS — H11233 Symblepharon, bilateral: Secondary | ICD-10-CM | POA: Diagnosis not present

## 2020-07-13 DIAGNOSIS — H16103 Unspecified superficial keratitis, bilateral: Secondary | ICD-10-CM | POA: Diagnosis not present

## 2020-07-13 DIAGNOSIS — H16143 Punctate keratitis, bilateral: Secondary | ICD-10-CM | POA: Diagnosis not present

## 2020-07-16 DIAGNOSIS — H11233 Symblepharon, bilateral: Secondary | ICD-10-CM | POA: Diagnosis not present

## 2020-07-16 DIAGNOSIS — H16143 Punctate keratitis, bilateral: Secondary | ICD-10-CM | POA: Diagnosis not present

## 2020-07-16 DIAGNOSIS — H16103 Unspecified superficial keratitis, bilateral: Secondary | ICD-10-CM | POA: Diagnosis not present

## 2020-07-16 DIAGNOSIS — S0502XD Injury of conjunctiva and corneal abrasion without foreign body, left eye, subsequent encounter: Secondary | ICD-10-CM | POA: Diagnosis not present

## 2020-07-20 DIAGNOSIS — H16143 Punctate keratitis, bilateral: Secondary | ICD-10-CM | POA: Diagnosis not present

## 2020-07-20 DIAGNOSIS — H16103 Unspecified superficial keratitis, bilateral: Secondary | ICD-10-CM | POA: Diagnosis not present

## 2020-07-20 DIAGNOSIS — H11233 Symblepharon, bilateral: Secondary | ICD-10-CM | POA: Diagnosis not present

## 2020-07-20 DIAGNOSIS — S0502XD Injury of conjunctiva and corneal abrasion without foreign body, left eye, subsequent encounter: Secondary | ICD-10-CM | POA: Diagnosis not present

## 2020-07-27 DIAGNOSIS — H11233 Symblepharon, bilateral: Secondary | ICD-10-CM | POA: Diagnosis not present

## 2020-07-27 DIAGNOSIS — S0502XD Injury of conjunctiva and corneal abrasion without foreign body, left eye, subsequent encounter: Secondary | ICD-10-CM | POA: Diagnosis not present

## 2020-07-27 DIAGNOSIS — H16103 Unspecified superficial keratitis, bilateral: Secondary | ICD-10-CM | POA: Diagnosis not present

## 2020-07-27 DIAGNOSIS — H16143 Punctate keratitis, bilateral: Secondary | ICD-10-CM | POA: Diagnosis not present

## 2020-08-03 DIAGNOSIS — S0502XD Injury of conjunctiva and corneal abrasion without foreign body, left eye, subsequent encounter: Secondary | ICD-10-CM | POA: Diagnosis not present

## 2020-08-03 DIAGNOSIS — H11233 Symblepharon, bilateral: Secondary | ICD-10-CM | POA: Diagnosis not present

## 2020-08-03 DIAGNOSIS — H16143 Punctate keratitis, bilateral: Secondary | ICD-10-CM | POA: Diagnosis not present

## 2020-08-03 DIAGNOSIS — H16103 Unspecified superficial keratitis, bilateral: Secondary | ICD-10-CM | POA: Diagnosis not present

## 2020-08-11 DIAGNOSIS — H11233 Symblepharon, bilateral: Secondary | ICD-10-CM | POA: Diagnosis not present

## 2020-08-11 DIAGNOSIS — L121 Cicatricial pemphigoid: Secondary | ICD-10-CM | POA: Diagnosis not present

## 2020-08-18 ENCOUNTER — Telehealth: Payer: Self-pay

## 2020-08-18 NOTE — Telephone Encounter (Signed)
New message    [name] is asking for Dr. Alain Marion to call him back today.

## 2020-08-19 DIAGNOSIS — H16103 Unspecified superficial keratitis, bilateral: Secondary | ICD-10-CM | POA: Diagnosis not present

## 2020-08-19 DIAGNOSIS — H11233 Symblepharon, bilateral: Secondary | ICD-10-CM | POA: Diagnosis not present

## 2020-08-19 DIAGNOSIS — S0502XD Injury of conjunctiva and corneal abrasion without foreign body, left eye, subsequent encounter: Secondary | ICD-10-CM | POA: Diagnosis not present

## 2020-08-19 DIAGNOSIS — K5730 Diverticulosis of large intestine without perforation or abscess without bleeding: Secondary | ICD-10-CM | POA: Diagnosis not present

## 2020-08-19 DIAGNOSIS — H16143 Punctate keratitis, bilateral: Secondary | ICD-10-CM | POA: Diagnosis not present

## 2020-08-23 NOTE — Telephone Encounter (Signed)
Left [name] a vm stating PCP is out of office and to call back if there's something myself or a provider can assist him with

## 2020-08-27 DIAGNOSIS — H16103 Unspecified superficial keratitis, bilateral: Secondary | ICD-10-CM | POA: Diagnosis not present

## 2020-08-27 DIAGNOSIS — H11233 Symblepharon, bilateral: Secondary | ICD-10-CM | POA: Diagnosis not present

## 2020-08-27 DIAGNOSIS — H16143 Punctate keratitis, bilateral: Secondary | ICD-10-CM | POA: Diagnosis not present

## 2020-08-27 DIAGNOSIS — S0502XD Injury of conjunctiva and corneal abrasion without foreign body, left eye, subsequent encounter: Secondary | ICD-10-CM | POA: Diagnosis not present

## 2020-09-10 DIAGNOSIS — H16143 Punctate keratitis, bilateral: Secondary | ICD-10-CM | POA: Diagnosis not present

## 2020-09-10 DIAGNOSIS — S0502XD Injury of conjunctiva and corneal abrasion without foreign body, left eye, subsequent encounter: Secondary | ICD-10-CM | POA: Diagnosis not present

## 2020-09-10 DIAGNOSIS — H11233 Symblepharon, bilateral: Secondary | ICD-10-CM | POA: Diagnosis not present

## 2020-09-10 DIAGNOSIS — H02051 Trichiasis without entropian right upper eyelid: Secondary | ICD-10-CM | POA: Diagnosis not present

## 2020-09-10 DIAGNOSIS — H16103 Unspecified superficial keratitis, bilateral: Secondary | ICD-10-CM | POA: Diagnosis not present

## 2020-09-14 DIAGNOSIS — H02051 Trichiasis without entropian right upper eyelid: Secondary | ICD-10-CM | POA: Diagnosis not present

## 2020-09-14 DIAGNOSIS — S0502XD Injury of conjunctiva and corneal abrasion without foreign body, left eye, subsequent encounter: Secondary | ICD-10-CM | POA: Diagnosis not present

## 2020-09-14 DIAGNOSIS — H16103 Unspecified superficial keratitis, bilateral: Secondary | ICD-10-CM | POA: Diagnosis not present

## 2020-09-14 DIAGNOSIS — H11233 Symblepharon, bilateral: Secondary | ICD-10-CM | POA: Diagnosis not present

## 2020-09-14 DIAGNOSIS — H16143 Punctate keratitis, bilateral: Secondary | ICD-10-CM | POA: Diagnosis not present

## 2020-09-17 DIAGNOSIS — S0502XD Injury of conjunctiva and corneal abrasion without foreign body, left eye, subsequent encounter: Secondary | ICD-10-CM | POA: Diagnosis not present

## 2020-09-17 DIAGNOSIS — H16103 Unspecified superficial keratitis, bilateral: Secondary | ICD-10-CM | POA: Diagnosis not present

## 2020-09-17 DIAGNOSIS — H11233 Symblepharon, bilateral: Secondary | ICD-10-CM | POA: Diagnosis not present

## 2020-09-17 DIAGNOSIS — H16143 Punctate keratitis, bilateral: Secondary | ICD-10-CM | POA: Diagnosis not present

## 2020-09-24 DIAGNOSIS — H16103 Unspecified superficial keratitis, bilateral: Secondary | ICD-10-CM | POA: Diagnosis not present

## 2020-09-24 DIAGNOSIS — H11233 Symblepharon, bilateral: Secondary | ICD-10-CM | POA: Diagnosis not present

## 2020-09-24 DIAGNOSIS — S0502XD Injury of conjunctiva and corneal abrasion without foreign body, left eye, subsequent encounter: Secondary | ICD-10-CM | POA: Diagnosis not present

## 2020-09-24 DIAGNOSIS — H16143 Punctate keratitis, bilateral: Secondary | ICD-10-CM | POA: Diagnosis not present

## 2020-10-08 DIAGNOSIS — H5713 Ocular pain, bilateral: Secondary | ICD-10-CM | POA: Diagnosis not present

## 2020-10-08 DIAGNOSIS — S0502XD Injury of conjunctiva and corneal abrasion without foreign body, left eye, subsequent encounter: Secondary | ICD-10-CM | POA: Diagnosis not present

## 2020-10-08 DIAGNOSIS — H16143 Punctate keratitis, bilateral: Secondary | ICD-10-CM | POA: Diagnosis not present

## 2020-10-08 DIAGNOSIS — H02051 Trichiasis without entropian right upper eyelid: Secondary | ICD-10-CM | POA: Diagnosis not present

## 2020-10-15 DIAGNOSIS — S0502XD Injury of conjunctiva and corneal abrasion without foreign body, left eye, subsequent encounter: Secondary | ICD-10-CM | POA: Diagnosis not present

## 2020-10-15 DIAGNOSIS — H02051 Trichiasis without entropian right upper eyelid: Secondary | ICD-10-CM | POA: Diagnosis not present

## 2020-10-15 DIAGNOSIS — H16103 Unspecified superficial keratitis, bilateral: Secondary | ICD-10-CM | POA: Diagnosis not present

## 2020-10-15 DIAGNOSIS — H16143 Punctate keratitis, bilateral: Secondary | ICD-10-CM | POA: Diagnosis not present

## 2020-10-15 DIAGNOSIS — H11233 Symblepharon, bilateral: Secondary | ICD-10-CM | POA: Diagnosis not present

## 2020-10-20 DIAGNOSIS — H16143 Punctate keratitis, bilateral: Secondary | ICD-10-CM | POA: Diagnosis not present

## 2020-10-20 DIAGNOSIS — H11233 Symblepharon, bilateral: Secondary | ICD-10-CM | POA: Diagnosis not present

## 2020-10-20 DIAGNOSIS — S0502XD Injury of conjunctiva and corneal abrasion without foreign body, left eye, subsequent encounter: Secondary | ICD-10-CM | POA: Diagnosis not present

## 2020-10-20 DIAGNOSIS — H16103 Unspecified superficial keratitis, bilateral: Secondary | ICD-10-CM | POA: Diagnosis not present

## 2020-11-01 ENCOUNTER — Telehealth: Payer: Self-pay | Admitting: Internal Medicine

## 2020-11-01 NOTE — Telephone Encounter (Signed)
LVM for pt to rtn my call to schedule AWV with [facility]. Please schedule this appt if pt calls the office    Thanks, [phone] 

## 2020-11-10 ENCOUNTER — Encounter: Payer: Self-pay | Admitting: Internal Medicine

## 2020-11-10 ENCOUNTER — Other Ambulatory Visit: Payer: Self-pay | Admitting: Internal Medicine

## 2020-11-10 ENCOUNTER — Ambulatory Visit (INDEPENDENT_AMBULATORY_CARE_PROVIDER_SITE_OTHER): Payer: Medicare Other | Admitting: Internal Medicine

## 2020-11-10 ENCOUNTER — Other Ambulatory Visit: Payer: Self-pay

## 2020-11-10 VITALS — BP 130/68 | HR 63 | Temp 98.2°F | Wt 133.0 lb

## 2020-11-10 DIAGNOSIS — E785 Hyperlipidemia, unspecified: Secondary | ICD-10-CM

## 2020-11-10 DIAGNOSIS — R634 Abnormal weight loss: Secondary | ICD-10-CM

## 2020-11-10 DIAGNOSIS — L129 Pemphigoid, unspecified: Secondary | ICD-10-CM | POA: Diagnosis not present

## 2020-11-10 DIAGNOSIS — E119 Type 2 diabetes mellitus without complications: Secondary | ICD-10-CM | POA: Diagnosis not present

## 2020-11-10 DIAGNOSIS — E559 Vitamin D deficiency, unspecified: Secondary | ICD-10-CM | POA: Diagnosis not present

## 2020-11-10 DIAGNOSIS — E538 Deficiency of other specified B group vitamins: Secondary | ICD-10-CM

## 2020-11-10 DIAGNOSIS — N183 Chronic kidney disease, stage 3 unspecified: Secondary | ICD-10-CM

## 2020-11-10 DIAGNOSIS — I10 Essential (primary) hypertension: Secondary | ICD-10-CM

## 2020-11-10 DIAGNOSIS — Z23 Encounter for immunization: Secondary | ICD-10-CM | POA: Diagnosis not present

## 2020-11-10 LAB — LIPID PANEL
Cholesterol: 160 mg/dL (ref 0–200)
HDL: 38.2 mg/dL — ABNORMAL LOW (ref >39.00)
LDL Cholesterol: 94 mg/dL (ref 0–99)
NonHDL: 121.75
Total CHOL/HDL Ratio: 4
Triglycerides: 139 mg/dL (ref 0.0–149.0)
VLDL: 27.8 mg/dL (ref 0.0–40.0)

## 2020-11-10 LAB — COMPREHENSIVE METABOLIC PANEL
ALT: 16 U/L (ref 0–53)
AST: 23 U/L (ref 0–37)
Albumin: 4.2 g/dL (ref 3.5–5.2)
Alkaline Phosphatase: 60 U/L (ref 39–117)
BUN: 18 mg/dL (ref 6–23)
CO2: 30 mEq/L (ref 19–32)
Calcium: 9.6 mg/dL (ref 8.4–10.5)
Chloride: 102 mEq/L (ref 96–112)
Creatinine, Ser: 1.31 mg/dL (ref 0.40–1.50)
GFR: 52.1 mL/min — ABNORMAL LOW (ref >60.00)
Glucose, Bld: 105 mg/dL — ABNORMAL HIGH (ref 70–99)
Potassium: 4.6 mEq/L (ref 3.5–5.1)
Sodium: 138 mEq/L (ref 135–145)
Total Bilirubin: 0.5 mg/dL (ref 0.2–1.2)
Total Protein: 6.8 g/dL (ref 6.0–8.3)

## 2020-11-10 LAB — CBC WITH DIFFERENTIAL/PLATELET
Basophils Absolute: 0.1 10*3/uL (ref 0.0–0.1)
Basophils Relative: 0.9 % (ref 0.0–3.0)
Eosinophils Absolute: 0.2 10*3/uL (ref 0.0–0.7)
Eosinophils Relative: 2.9 % (ref 0.0–5.0)
HCT: 39.2 % (ref 39.0–52.0)
Hemoglobin: 12.9 g/dL — ABNORMAL LOW (ref 13.0–17.0)
Lymphocytes Relative: 37.5 % (ref 12.0–46.0)
Lymphs Abs: 2.6 10*3/uL (ref 0.7–4.0)
MCHC: 32.9 g/dL (ref 30.0–36.0)
MCV: 89.7 fl (ref 78.0–100.0)
Monocytes Absolute: 0.7 10*3/uL (ref 0.1–1.0)
Monocytes Relative: 10.7 % (ref 3.0–12.0)
Neutro Abs: 3.3 10*3/uL (ref 1.4–7.7)
Neutrophils Relative %: 48 % (ref 43.0–77.0)
Platelets: 359 10*3/uL (ref 150.0–400.0)
RBC: 4.37 Mil/uL (ref 4.22–5.81)
RDW: 13.3 % (ref 11.5–15.5)
WBC: 6.9 10*3/uL (ref 4.0–10.5)

## 2020-11-10 LAB — URINALYSIS
Bilirubin Urine: NEGATIVE
Hgb urine dipstick: NEGATIVE
Ketones, ur: NEGATIVE
Leukocytes,Ua: NEGATIVE
Nitrite: NEGATIVE
Specific Gravity, Urine: >=1.03 — AB (ref 1.000–1.030)
Total Protein, Urine: NEGATIVE
Urine Glucose: NEGATIVE
Urobilinogen, UA: 0.2 (ref 0.0–1.0)
pH: 5.5 (ref 5.0–8.0)

## 2020-11-10 LAB — MICROALBUMIN / CREATININE URINE RATIO
Creatinine,U: 108.8 mg/dL
Microalb Creat Ratio: 1.6 mg/g (ref 0.0–30.0)
Microalb, Ur: 1.8 mg/dL (ref 0.0–1.9)

## 2020-11-10 LAB — VITAMIN D 25 HYDROXY (VIT D DEFICIENCY, FRACTURES): VITD: 47.09 ng/mL (ref 30.00–100.00)

## 2020-11-10 LAB — TSH: TSH: 1.04 u[IU]/mL (ref 0.35–4.50)

## 2020-11-10 LAB — PSA: PSA: 0.63 ng/mL (ref 0.10–4.00)

## 2020-11-10 LAB — HEMOGLOBIN A1C: Hgb A1c MFr Bld: 6.1 % (ref 4.6–6.5)

## 2020-11-10 LAB — VITAMIN B12: Vitamin B-12: 1056 pg/mL — ABNORMAL HIGH (ref 211–911)

## 2020-11-10 NOTE — Patient Instructions (Signed)
Host Defense, Stamets 7 Capsules, Daily Immune Support, Mushroom Supplement with Lion's Mane, Reishi, Vegan, Organic, 60 Capsules (30 Servings)

## 2020-11-10 NOTE — Assessment & Plan Note (Signed)
Labs

## 2020-11-10 NOTE — Assessment & Plan Note (Addendum)
Assessment: 1. Ocular cicatricial pemphigoid -referred from Dr. Lucita Ferrara, pt is retired ophthalmologist -symptoms of irritation and dryness after YAG capsulotomy in 09/2019 Dr. Bing Plume -had allergic reaction to restasis  -finished trial of doxycycline for 2 weeks -Conj biopsy 2/2: No diagnostic deposits were seen, only a strong linear band of fibrin at the basement mebrane -Additionally, Quantiferon TB Positive (history of BCG vaccination) Conjunctival Biopsy negative - Has been flaring while tapering steroids.   Today, off topical steroids. Quiet, doing well Tolerating soft contact lens. OD dystychiasis. Removed lashes.   Gtts: Albumin TID OU (out of drops) PF Refresh prn OU  Retaine prn OU  Cellcept 1500 mg TID  2.Symblepharon, bilateral #1  3. Monitoring long term medication Loss of appetite , Some loss of weight. Will get labs at home and decrease CellCept 2 gm  Plan: CPM Continue soft contact lens and repeat scleral lens fitting.  Decreased CellCept 2gm  HPI, PMH, FH and ROS as documented was explored in detail and no additional concerns reported.  Try Host Defense, Stamets 7 Capsule

## 2020-11-10 NOTE — Assessment & Plan Note (Signed)
On Metformin Labs

## 2020-11-10 NOTE — Progress Notes (Signed)
Subjective:  Patient ID: Louis Kemp, male    DOB: 20-Dec-1941  Age: 79 y.o. MRN: [id number]  CC: Follow-up (6 MONTH F/U)   HPI Louis Kemp presents for wt loss since Feb 2021 when he started Cellcept  F/u B12 def, pemphigoid - eyes  Outpatient Medications Prior to Visit  Medication Sig Dispense Refill  . amLODipine (NORVASC) 2.5 MG tablet TAKE ONE TABLET BY MOUTH DAILY 90 tablet 3  . carvedilol (COREG) 12.5 MG tablet TAKE ONE TABLET BY MOUTH TWICE A DAY WITH A MEAL 180 tablet 3  . Cholecalciferol (VITAMIN D) 2000 UNITS CAPS Take 1 capsule by mouth every morning.    . cyanocobalamin (,VITAMIN B-12,) 1000 MCG/ML injection Inject 1 mL (1,000 mcg total) into the muscle every 14 (fourteen) days. 30 mL 3  . fenofibrate 160 MG tablet Take 1 tablet (160 mg total) by mouth daily. 90 tablet 3  . metFORMIN (GLUCOPHAGE) 500 MG tablet Take 1 tablet (500 mg total) by mouth 3 (three) times daily. 270 tablet 3  . mycophenolate (CELLCEPT) 500 MG tablet Take by mouth.    . tamsulosin (FLOMAX) 0.4 MG CAPS capsule Take 1 capsule (0.4 mg total) by mouth daily. 90 capsule 3  . predniSONE (DELTASONE) 10 MG tablet Prednisone 10 mg: take 4 tabs a day x 3 days; then 3 tabs a day x 4 days; then 2 tabs a day x 4 days, then 1 tab a day x 6 days, then stop. Take pc. (Patient not taking: Reported on 11/10/2020) 38 tablet 1   No facility-administered medications prior to visit.    ROS: Review of Systems  Constitutional: Positive for unexpected weight change. Negative for appetite change and fatigue.  HENT: Negative for congestion, nosebleeds, sneezing, sore throat and trouble swallowing.   Eyes: Negative for itching and visual disturbance.  Respiratory: Negative for cough.   Cardiovascular: Negative for chest pain, palpitations and leg swelling.  Gastrointestinal: Negative for abdominal distention, blood in stool, diarrhea and nausea.  Genitourinary: Negative for frequency and hematuria.  Musculoskeletal:  Negative for back pain, gait problem, joint swelling and neck pain.  Skin: Negative for rash.  Neurological: Negative for dizziness, tremors, speech difficulty and weakness.  Psychiatric/Behavioral: Negative for agitation, dysphoric mood and sleep disturbance. The patient is not nervous/anxious.     Objective:  BP 130/68 (BP Location: Left Arm)   Pulse 63   Temp 98.2 F (36.8 C) (Oral)   Wt 133 lb (60.3 kg)   SpO2 98%   BMI 20.83 kg/m   BP Readings from Last 3 Encounters:  11/10/20 130/68  05/10/20 132/80  11/03/19 118/62    Wt Readings from Last 3 Encounters:  11/10/20 133 lb (60.3 kg)  05/10/20 137 lb (62.1 kg)  11/03/19 140 lb (63.5 kg)    Physical Exam Constitutional:      General: He is not in acute distress.    Appearance: He is well-developed.     Comments: NAD  Eyes:     Conjunctiva/sclera: Conjunctivae normal.     Pupils: Pupils are equal, round, and reactive to light.  Neck:     Thyroid: No thyromegaly.     Vascular: No JVD.  Cardiovascular:     Rate and Rhythm: Normal rate and regular rhythm.     Heart sounds: Normal heart sounds. No murmur heard.  No friction rub. No gallop.   Pulmonary:     Effort: Pulmonary effort is normal. No respiratory distress.     Breath sounds:  Normal breath sounds. No wheezing or rales.  Chest:     Chest wall: No tenderness.  Abdominal:     General: Bowel sounds are normal. There is no distension.     Palpations: Abdomen is soft. There is no mass.     Tenderness: There is no abdominal tenderness. There is no guarding or rebound.  Musculoskeletal:        General: No tenderness. Normal range of motion.     Cervical back: Normal range of motion.  Lymphadenopathy:     Cervical: No cervical adenopathy.  Skin:    General: Skin is warm and dry.     Findings: No rash.  Neurological:     Mental Status: He is alert and oriented to person, place, and time.     Cranial Nerves: No cranial nerve deficit.     Motor: No abnormal  muscle tone.     Coordination: Coordination normal.     Gait: Gait normal.     Deep Tendon Reflexes: Reflexes are normal and symmetric.  Psychiatric:        Behavior: Behavior normal.        Thought Content: Thought content normal.        Judgment: Judgment normal.   vitiligo Photophobic thin  Lab Results  Component Value Date   WBC 7.9 07/02/2019   HGB 14.0 07/02/2019   HCT 42.0 07/02/2019   PLT 351.0 07/02/2019   GLUCOSE 120 (H) 11/03/2019   CHOL 185 07/02/2019   TRIG 98.0 07/02/2019   HDL 45.90 07/02/2019   LDLCALC 120 (H) 07/02/2019   ALT 16 07/02/2019   AST 21 07/02/2019   NA 137 11/03/2019   K 4.3 11/03/2019   CL 104 11/03/2019   CREATININE 1.67 (H) 11/03/2019   BUN 15 11/03/2019   CO2 25 11/03/2019   TSH 1.51 07/02/2019   PSA 0.50 07/02/2019   INR 1.01 09/03/2014   HGBA1C 6.5 11/03/2019   MICROALBUR 1.3 07/02/2019    US Renal  Result Date: 07/30/2018 CLINICAL DATA:  Renal insufficiency EXAM: RENAL / URINARY TRACT ULTRASOUND COMPLETE COMPARISON:  CT abdomen and pelvis June 20, 2013 FINDINGS: Right Kidney: Length: 9.6 cm. Echogenicity is mildly increased. Renal cortical thickness is within normal limits. No perinephric fluid or hydronephrosis visualized. There is a cyst arising from the upper pole of the right kidney measuring 4.3 x 4.2 x 4.3 cm. No sonographically demonstrable calculus or ureterectasis. Left Kidney: Length: 10.4 cm. Echogenicity is mildly increased. Renal cortical thickness is within normal limits. No perinephric fluid or hydronephrosis visualized. There is a small cyst arising from the upper pole left kidney measuring 1.3 x 1.1 x 1.1 cm. There is a nearby small cyst in this area measuring 1.5 x 1.0 x 0.8 cm. No sonographically demonstrable calculus or ureterectasis. Bladder: Appears normal for degree of bladder distention. IMPRESSION: Kidneys show mild increase in renal echogenicity, a finding that may be indicative of medical renal disease. Renal  cortical thickness normal bilaterally. No obstructing focus in either kidney. Small cysts noted in each kidney. Electronically Signed   By: Lowella Grip III M.D.   On: 07/30/2018 11:13    Assessment & Plan:     Louis Kehr, MD

## 2020-11-10 NOTE — Assessment & Plan Note (Addendum)
Likely due to Cellcept - dose reduced. CT abd/pelvis if not better  This SmartLink has not been configured with any valid records.   Wt Readings from Last 3 Encounters:  11/10/20 133 lb (60.3 kg)  05/10/20 137 lb (62.1 kg)  11/03/19 140 lb (63.5 kg)

## 2020-11-10 NOTE — Assessment & Plan Note (Signed)
On B12 

## 2020-11-10 NOTE — Assessment & Plan Note (Signed)
labs

## 2020-11-10 NOTE — Assessment & Plan Note (Signed)
Vit D 

## 2020-11-11 ENCOUNTER — Other Ambulatory Visit: Payer: Self-pay | Admitting: Internal Medicine

## 2020-11-11 DIAGNOSIS — R634 Abnormal weight loss: Secondary | ICD-10-CM

## 2020-11-11 DIAGNOSIS — R110 Nausea: Secondary | ICD-10-CM

## 2020-11-23 DIAGNOSIS — H16103 Unspecified superficial keratitis, bilateral: Secondary | ICD-10-CM | POA: Diagnosis not present

## 2020-11-23 DIAGNOSIS — H02054 Trichiasis without entropian left upper eyelid: Secondary | ICD-10-CM | POA: Diagnosis not present

## 2020-11-23 DIAGNOSIS — H16143 Punctate keratitis, bilateral: Secondary | ICD-10-CM | POA: Diagnosis not present

## 2020-11-23 DIAGNOSIS — S0502XA Injury of conjunctiva and corneal abrasion without foreign body, left eye, initial encounter: Secondary | ICD-10-CM | POA: Diagnosis not present

## 2020-11-23 DIAGNOSIS — H11233 Symblepharon, bilateral: Secondary | ICD-10-CM | POA: Diagnosis not present

## 2020-11-23 DIAGNOSIS — H5713 Ocular pain, bilateral: Secondary | ICD-10-CM | POA: Diagnosis not present

## 2020-11-24 DIAGNOSIS — H11233 Symblepharon, bilateral: Secondary | ICD-10-CM | POA: Diagnosis not present

## 2020-11-24 DIAGNOSIS — Z79899 Other long term (current) drug therapy: Secondary | ICD-10-CM | POA: Diagnosis not present

## 2020-11-24 DIAGNOSIS — L121 Cicatricial pemphigoid: Secondary | ICD-10-CM | POA: Diagnosis not present

## 2020-12-08 ENCOUNTER — Ambulatory Visit
Admission: RE | Admit: 2020-12-08 | Discharge: 2020-12-08 | Disposition: A | Discharge status: Home / Self Care | Payer: Medicare Other | Source: Ambulatory Visit | Attending: Internal Medicine | Admitting: Internal Medicine

## 2020-12-08 DIAGNOSIS — R634 Abnormal weight loss: Secondary | ICD-10-CM | POA: Diagnosis not present

## 2020-12-08 DIAGNOSIS — N281 Cyst of kidney, acquired: Secondary | ICD-10-CM | POA: Diagnosis not present

## 2020-12-30 DIAGNOSIS — H1089 Other conjunctivitis: Secondary | ICD-10-CM | POA: Insufficient documentation

## 2020-12-31 DIAGNOSIS — H02051 Trichiasis without entropian right upper eyelid: Secondary | ICD-10-CM | POA: Diagnosis not present

## 2020-12-31 DIAGNOSIS — H16103 Unspecified superficial keratitis, bilateral: Secondary | ICD-10-CM | POA: Diagnosis not present

## 2020-12-31 DIAGNOSIS — H16143 Punctate keratitis, bilateral: Secondary | ICD-10-CM | POA: Diagnosis not present

## 2020-12-31 DIAGNOSIS — S0502XA Injury of conjunctiva and corneal abrasion without foreign body, left eye, initial encounter: Secondary | ICD-10-CM | POA: Diagnosis not present

## 2020-12-31 DIAGNOSIS — H11233 Symblepharon, bilateral: Secondary | ICD-10-CM | POA: Diagnosis not present

## 2021-01-04 ENCOUNTER — Telehealth: Payer: Self-pay | Admitting: Neurology

## 2021-01-04 ENCOUNTER — Other Ambulatory Visit: Payer: Self-pay | Admitting: Neurology

## 2021-01-04 DIAGNOSIS — H5461 Unqualified visual loss, right eye, normal vision left eye: Secondary | ICD-10-CM

## 2021-01-04 DIAGNOSIS — S0502XD Injury of conjunctiva and corneal abrasion without foreign body, left eye, subsequent encounter: Secondary | ICD-10-CM | POA: Diagnosis not present

## 2021-01-04 DIAGNOSIS — S0501XA Injury of conjunctiva and corneal abrasion without foreign body, right eye, initial encounter: Secondary | ICD-10-CM | POA: Diagnosis not present

## 2021-01-04 DIAGNOSIS — R519 Headache, unspecified: Secondary | ICD-10-CM

## 2021-01-04 MED ORDER — ALPRAZOLAM 0.25 MG PO TABS
ORAL_TABLET | ORAL | 0 refills | Status: DC
Start: 2021-01-04 — End: 2021-01-06

## 2021-01-04 NOTE — Telephone Encounter (Signed)
He is scheduled for Thursday

## 2021-01-04 NOTE — Telephone Encounter (Signed)
Ariana, Urgent request. Can we get him scheduled Thursday 10am in my unfilled emg spot?[facility] fyi)

## 2021-01-05 ENCOUNTER — Other Ambulatory Visit: Payer: Self-pay

## 2021-01-05 ENCOUNTER — Ambulatory Visit
Admission: RE | Admit: 2021-01-05 | Discharge: 2021-01-05 | Disposition: A | Discharge status: Home / Self Care | Payer: Medicare Other | Source: Ambulatory Visit | Attending: Neurology | Admitting: Neurology

## 2021-01-05 DIAGNOSIS — H5461 Unqualified visual loss, right eye, normal vision left eye: Secondary | ICD-10-CM

## 2021-01-05 DIAGNOSIS — R519 Headache, unspecified: Secondary | ICD-10-CM | POA: Diagnosis not present

## 2021-01-05 MED ORDER — GADOBENATE DIMEGLUMINE 529 MG/ML IV SOLN
10.0000 mL | Freq: Once | INTRAVENOUS | Status: AC | PRN
  Administered 2021-01-05: 10 mL via INTRAVENOUS

## 2021-01-06 ENCOUNTER — Ambulatory Visit: Payer: Medicare Other | Admitting: Neurology

## 2021-01-06 ENCOUNTER — Encounter: Payer: Self-pay | Admitting: Neurology

## 2021-01-06 VITALS — BP 118/72 | HR 71 | Ht 66.0 in | Wt 131.6 lb

## 2021-01-06 DIAGNOSIS — M316 Other giant cell arteritis: Secondary | ICD-10-CM | POA: Diagnosis not present

## 2021-01-06 DIAGNOSIS — R519 Headache, unspecified: Secondary | ICD-10-CM | POA: Diagnosis not present

## 2021-01-06 MED ORDER — PREDNISONE 20 MG PO TABS: 60.0000 mg | ORAL_TABLET | Freq: Every day | ORAL | 0 refills | Status: DC

## 2021-01-06 NOTE — Patient Instructions (Signed)
Steroid infusion today and starting tomorrow 69m with breakfast I will contact your [facility] physician but will place order for temporal artery biopsy Esr/crp today   Methylprednisolone Solution for Injection What is this medicine? METHYLPREDNISOLONE (meth ill pred NISS oh lone) is a corticosteroid. It is commonly used to treat inflammation of the skin, joints, lungs, and other organs. Common conditions treated include asthma, allergies, and arthritis. It is also used for other conditions, such as blood disorders and diseases of the adrenal glands. This medicine may be used for other purposes; ask your health care provider or pharmacist if you have questions. COMMON BRAND NAME(S): A-Methapred, Solu-Medrol What should I tell my health care provider before I take this medicine? They need to know if you have any of these conditions:  Cushing's syndrome  eye disease, vision problems  diabetes  glaucoma  heart disease  high blood pressure  infection (especially a virus infection such as chickenpox, cold sores, or herpes)  liver disease  mental illness  myasthenia gravis  osteoporosis  recently received or scheduled to receive a vaccine  seizures  stomach or intestine problems  thyroid disease  an unusual or allergic reaction to lactose, methylprednisolone, other medicines, foods, dyes, or preservatives  pregnant or trying to get pregnant  breast-feeding How should I use this medicine? This medicine is for injection or infusion into a vein. It is also for injection into a muscle. It is given by a health care professional in a hospital or clinic setting. Talk to your pediatrician regarding the use of this medicine in children. While this drug may be prescribed for selected conditions, precautions do apply. Overdosage: If you think you have taken too much of this medicine contact a poison control center or emergency room at once. NOTE: This medicine is only for you. Do not  share this medicine with others. What if I miss a dose? This does not apply. What may interact with this medicine? Do not take this medicine with any of the following medications:  alefacept  echinacea  iopamidol  live virus vaccines  metyrapone  mifepristone This medicine may also interact with the following medications:  amphotericin B  aspirin and aspirin-like medicines  certain antibiotics like erythromycin, clarithromycin, troleandomycin  certain medicines for diabetes  certain medicines for fungal infection like ketoconazole  certain medicines for seizures like carbamazepine, phenobarbital, phenytoin  certain medicines that treat or prevent blood clots like warfarin  cyclosporine  digoxin  diuretics  male hormones, like estrogens and birth control pills  isoniazid  NSAIDS, medicines for pain and inflammation, like ibuprofen or naproxen  other medicines for myasthenia gravis  rifampin  vaccines This list may not describe all possible interactions. Give your health care provider a list of all the medicines, herbs, non-prescription drugs, or dietary supplements you use. Also tell them if you smoke, drink alcohol, or use illegal drugs. Some items may interact with your medicine. What should I watch for while using this medicine? Tell your doctor or healthcare professional if your symptoms do not start to get better or if they get worse. Do not stop taking except on your doctor's advice. You may develop a severe reaction. Your doctor will tell you how much medicine to take. Your condition will be monitored carefully while you are receiving this medicine. This medicine may increase your risk of getting an infection. Tell your doctor or health care professional if you are around anyone with measles or chickenpox, or if you develop sores or blisters that  do not heal properly. This medicine may increase blood sugar. Ask your healthcare provider if changes in diet  or medicines are needed if you have diabetes. Tell your doctor or health care professional right away if you have any change in your eyesight. Using this medicine for a long time may increase your risk of low bone mass. Talk to your doctor about bone health. What side effects may I notice from receiving this medicine? Side effects that you should report to your doctor or health care professional as soon as possible:  allergic reactions like skin rash, itching or hives, swelling of the face, lips, or tongue  bloody or tarry stools  hallucination, loss of contact with reality  muscle cramps  muscle pain  palpitations  signs and symptoms of high blood sugar such as being more thirsty or hungry or having to urinate more than normal. You may also feel very tired or have blurry vision.  signs and symptoms of infection like fever or chills; cough; sore throat; pain or trouble passing urine  trouble passing urine Side effects that usually do not require medical attention (report to your doctor or health care professional if they continue or are bothersome):  changes in emotions or mood  constipation  diarrhea  excessive hair growth on the face or body  headache  nausea, vomiting  pain, redness, or irritation at site where injected  trouble sleeping  weight gain This list may not describe all possible side effects. Call your doctor for medical advice about side effects. You may report side effects to FDA at 1-800-FDA-1088. Where should I keep my medicine? This drug is given in a hospital or clinic and will not be stored at home. NOTE: This sheet is a summary. It may not cover all possible information. If you have questions about this medicine, talk to your doctor, pharmacist, or health care provider.  2021 [employer]/Gold Standard (2018-09-12 09:12:19)

## 2021-01-06 NOTE — Progress Notes (Signed)
[facility]    Provider:  Dr Jaynee Eagles Requesting Provider: [name], Evie Lacks, MD Primary Care Provider:  [name], Evie Lacks, MD  CC:  Right temporal hedache  HPI:  Louis Kemp is a 80 y.o. male here as requested by [name], Evie Lacks, MD for headaches. PMHx ocular pemphigoid, B12 deficiency, HTN, PAC, DM2. New onset right temporal headache, continuous, sever at times.  Headaches started 2 weeks ago, it wasn't that bad but it is worsening and now can be severe, it is in the temple and radiates to the temple and frontal area. It becomes quite severe 7-8/10. Sharp pain, shooting pain, he had prior headache in the left eye which was not similar it was ocular pain his eye was so inflamed due to the Pemphigoid. This is not the same, this is new, and is not anything he has ever experienced. He has noticed in the last 4-5 days when he eats he has jaw pain. The right eye vision is worsening. No significant history of migraines. No other significant history of headaches, he had one migraine in the remote past and this is nothing like it. The pain is continuous, no known triggers. Alleve is not working. Had a long discussion with patient regarding possiibilites and differential. No other focal neurologic deficits, associated symptoms, inciting events or modifiable factors.  Reviewed notes, labs and imaging from outside physicians, which showed:  Personally reviewed imaging with Dr. Mamie Nick Hopping(Neuroradiology) and agree with the following:mild T2 hyperintensities, mild cerebral atrophy, otherwise unremarkable for age.   11/10/2020: TSH nml, B12 1056, cbc unremarkable, CMP elevated glucose and reduced GFR 52 otherwise normal Review of Systems: Patient complains of symptoms per HPI as well as the following symptoms: light sensitivity . Pertinent negatives and positives per HPI. All others negative.   Social History   Socioeconomic History  . Marital status: Married    Spouse  name: Not on file  . Number of children: Not on file  . Years of education: Not on file  . Highest education level: Not on file  Occupational History  . Occupation: MD Retired  Tobacco Use  . Smoking status: Former Smoker    Types: Cigars  . Smokeless tobacco: Never Used  . Tobacco comment: none in 8 years  Vaping Use  . Vaping Use: Never used  Substance and Sexual Activity  . Alcohol use: Not Currently    Comment: maybe 1 mixed drink every 2 weeks or no   . Drug use: No    Comment: 3-4 cigars   year  . Sexual activity: Not Currently  Other Topics Concern  . Not on file  Social History Narrative   Regular Exercise -  YES, golf   Lives at home with wife    Right handed   Caffeine: 2-3 cups/day   Social Determinants of Health   Financial Resource Strain: Not on file  Food Insecurity: Not on file  Transportation Needs: Not on file  Physical Activity: Not on file  Stress: Not on file  Social Connections: Not on file  Intimate Partner Violence: Not on file    Family History  Problem Relation Age of Onset  . Arthritis Mother   . Hypertension Father   . Coronary artery disease Father   . Hypertension Brother   . Hypertension Other   . Hypertension Brother   . Coronary artery disease Other     Past Medical History:  Diagnosis Date  . BPH (benign prostatic hypertrophy)   . Hyperlipidemia   .  Hypertension   . LBP (low back pain)   . MVP (mitral valve prolapse)   . 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 Osteoarthritis   . PAC (premature atrial contraction)   . Statin intolerance   . Type II or unspecified type diabetes mellitus without mention of complication, not stated as uncontrolled 2011  . Vitamin B 12 deficiency 2011  . Vitamin D deficiency   . Vitiligo     Patient Active Problem List   Diagnosis Date Noted  . Right sided temporal headache 01/06/2021  . Chronic renal insufficiency, stage 3 (moderate) ([place]) 05/11/2020  .  Pemphigoid 05/10/2020  . Elevated serum creatinine 07/11/2018  . Hip pain, acute, right 01/10/2018  . Postoperative anemia due to acute blood loss 09/30/2014  . Status post left partial knee replacement 09/15/2014  . S/P left UKR 09/14/2014  . S/P right TKA 06/15/2014  . Osteoarthritis, knee 11/24/2013  . Weight loss 06/24/2013  . PAC (premature atrial contraction) 01/13/2013  . Heart murmur 01/13/2013  . Well adult exam 11/03/2012  . Paresthesia 10/22/2012  . Hypertension 04/18/2012  . Diabetes type 2, controlled ([place]) 10/20/2010  . B12 deficiency 10/20/2010  . Vitamin D deficiency 10/20/2010  . Dyslipidemia 07/14/2010  . BENIGN PROSTATIC HYPERTROPHY 07/14/2010  . OSTEOARTHRITIS 07/14/2010  . LOW BACK PAIN 07/14/2010  . HYPERGLYCEMIA 07/14/2010    Past Surgical History:  Procedure Laterality Date  . arm surgery Left    ORIF left  forearm  . CATARACT EXTRACTION  2012  . EYE SURGERY    . HERNIA REPAIR Bilateral 1971  . PARTIAL KNEE ARTHROPLASTY Left 09/14/2014   Procedure: LEFT UNICOMPARTMENTAL KNEE ARTHROPLASTY MEDIALLY;  Surgeon: Mauri Pole, MD;  Location: [facility];  Service: Orthopedics;  Laterality: Left;  . TOTAL KNEE ARTHROPLASTY Right 06/15/2014   Procedure: RIGHT TOTAL KNEE ARTHROPLASTY;  Surgeon: Mauri Pole, MD;  Location: [facility];  Service: Orthopedics;  Laterality: Right;    Current Outpatient Medications  Medication Sig Dispense Refill  . amLODipine (NORVASC) 2.5 MG tablet TAKE ONE TABLET BY MOUTH DAILY 90 tablet 3  . carvedilol (COREG) 12.5 MG tablet TAKE ONE TABLET BY MOUTH TWICE A DAY WITH A MEAL 180 tablet 3  . Cholecalciferol (VITAMIN D) 2000 UNITS CAPS Take 1 capsule by mouth every other day.    . cyanocobalamin (,VITAMIN B-12,) 1000 MCG/ML injection Inject 1 mL (1,000 mcg total) into the muscle every 14 (fourteen) days. 30 mL 3  . fenofibrate 160 MG tablet Take 1 tablet (160 mg total) by mouth daily. 90 tablet 3  . metFORMIN (GLUCOPHAGE) 500 MG tablet  TAKE 1 TABLET BY MOUTH 3 TIMES A DAY. 270 tablet 1  . mycophenolate (CELLCEPT) 500 MG tablet Take 1,500 mg by mouth in the morning and at bedtime.    . predniSONE (DELTASONE) 20 MG tablet Take 3 tablets (60 mg total) by mouth daily with breakfast. 90 tablet 0  . tamsulosin (FLOMAX) 0.4 MG CAPS capsule Take 1 capsule (0.4 mg total) by mouth daily. 90 capsule 3   No current facility-administered medications for this visit.    Allergies as of 01/06/2021 - Review Complete 01/06/2021  Allergen Reaction Noted  . Atorvastatin  10/20/2010  . Rosuvastatin  10/20/2010    Vitals: BP 118/72 (BP Location: Right Arm, Patient Position: Sitting)   Pulse 71   Ht _0  (1.676 m)   Wt 131 lb 9.6 oz (59.7 kg)   BMI 21.24 kg/m  Last Weight:  Wt Readings from Last 1 Encounters:  01/06/21 131 lb 9.6 oz (59.7 kg)   Last Height:   Ht Readings from Last 1 Encounters:  01/06/21 _0  (1.676 m)     Physical exam: Exam: Gen: Appears in pain, constantly wiping his eyes, photophobia, conversant, well nourised,  well groomed                     CV: RRR, no MRG. No Carotid Bruits. No peripheral edema, warm, nontender Eyes: Conjunctivae injection with lacrimation   Neuro: Detailed Neurologic Exam  Speech:    Speech is normal; fluent and spontaneous with normal comprehension.  Cognition:    The patient is oriented to person, place, and time;     recent and remote memory intact;     language fluent;     normal attention, concentration,     fund of knowledge Cranial Nerves:    The pupils are equal, round, and reactive to light. Could not visualize fundi due to photophobia. Visual fields are full to threat. Extraocular movements are intact. Trigeminal sensation is intact and the muscles of mastication are normal. The face is symmetric. The palate elevates in the midline. Hearing intact. Voice is normal. Shoulder shrug is normal. The tongue has normal motion without fasciculations.   Coordination:    No  dysmetria or ataxia  Gait:    Normal native gait  Motor Observation:    No asymmetry, no atrophy, and no involuntary movements noted. Tone:    Normal muscle tone.    Posture:    Posture is normal. normal erect    Strength:    Strength is V/V in the upper and lower limbs.      Sensation: intact to LT     Reflex Exam:  DTR's:    Deep tendon reflexes in the upper and lower extremities are symmetrical bilaterally.   Toes:    The toes are equiv bilaterally.   Clonus:    Clonus is absent.    Assessment/Plan:   80 y.o. male here as requested by [name], Evie Lacks, MD for headaches. PMHx ocular pemphigoid, B12 deficiency, HTN, PAC, DM2. New onset right temporal headache in the setting of pemphigoid Autoimmune eye disease), continuous, severe at times over the last 2 weeks with worsening right eye vision and jaw pain.  Patient is on cellcept so I would think Temporal Arteritis(GCA) less likely however he has a new right temporal headache (he has no significant headache history) over the last 2 weeks with jaw symptoms and worsening vision. I am going to treat him for GCA and order esr/crp however not sure they will be reliable. Will order Ultra Sound of the temporal arteries, if esr/crp elevated we can discuss a biopsy but may be elevated due to his current inflammatory eye disease. I have contacted his ocular immunologist Dr. Sharen Counter.   (Addendum: ESR 31(ULN 30), CRP 13(ULN 10), temporal artery ultrasound without evidence of halo sign)  Louis Gilbert, MD ocular immunologist [phone]   Orders Placed This Encounter  Procedures  . Sedimentation rate  . C-reactive protein  . CBC with Differential/Platelets  . Comprehensive metabolic panel  . VAS [place] TEMPORAL ARTERY BILATERAL   Meds ordered this encounter  Medications  . predniSONE (DELTASONE) 20 MG tablet    Sig: Take 3 tablets (60 mg total) by mouth daily with breakfast.    Dispense:  90 tablet    Refill:  0    Cc:  [name], Evie Lacks, MD,  Louis Gilbert, MD ocular immunologist [phone]  Louis Ill, MD  [facility] [address] [place], [place] [address]  Phone [phone] Fax [phone]   I spent over 60 minutes of face-to-face and non-face-to-face time with patient on the  1. Right sided temporal headache   2. Temporal arteritis (HCC)    diagnosis.  This included previsit chart review, lab review, study review, order entry, electronic health record documentation, patient education on the different diagnostic and therapeutic options, counseling and coordination of care, risks and benefits of management, compliance, or risk factor reduction

## 2021-01-06 NOTE — Progress Notes (Signed)
Order for Solumedrol 500 mg IV x 1 written and signed by Dr Jaynee Eagles. Order given to [facility] in [place].

## 2021-01-07 ENCOUNTER — Ambulatory Visit (HOSPITAL_COMMUNITY)
Admission: RE | Admit: 2021-01-07 | Discharge: 2021-01-07 | Disposition: A | Discharge status: Home / Self Care | Payer: Medicare Other | Source: Ambulatory Visit | Attending: Neurology | Admitting: Neurology

## 2021-01-07 ENCOUNTER — Other Ambulatory Visit: Payer: Self-pay

## 2021-01-07 DIAGNOSIS — R519 Headache, unspecified: Secondary | ICD-10-CM | POA: Diagnosis not present

## 2021-01-07 DIAGNOSIS — M316 Other giant cell arteritis: Secondary | ICD-10-CM | POA: Insufficient documentation

## 2021-01-07 LAB — COMPREHENSIVE METABOLIC PANEL
ALT: 17 IU/L (ref 0–44)
AST: 23 IU/L (ref 0–40)
Albumin/Globulin Ratio: 1.9 (ref 1.2–2.2)
Albumin: 4.2 g/dL (ref 3.7–4.7)
Alkaline Phosphatase: 75 IU/L (ref 44–121)
BUN/Creatinine Ratio: 11 (ref 10–24)
BUN: 14 mg/dL (ref 8–27)
Bilirubin Total: 0.3 mg/dL (ref 0.0–1.2)
CO2: 24 mmol/L (ref 20–29)
Calcium: 9.6 mg/dL (ref 8.6–10.2)
Chloride: 102 mmol/L (ref 96–106)
Creatinine, Ser: 1.25 mg/dL (ref 0.76–1.27)
GFR calc Af Amer: 63 mL/min/{1.73_m2} (ref >59)
GFR calc non Af Amer: 55 mL/min/{1.73_m2} — ABNORMAL LOW (ref >59)
Globulin, Total: 2.2 g/dL (ref 1.5–4.5)
Glucose: 103 mg/dL — ABNORMAL HIGH (ref 65–99)
Potassium: 4.4 mmol/L (ref 3.5–5.2)
Sodium: 141 mmol/L (ref 134–144)
Total Protein: 6.4 g/dL (ref 6.0–8.5)

## 2021-01-07 LAB — CBC WITH DIFFERENTIAL/PLATELET
Basophils Absolute: 0 10*3/uL (ref 0.0–0.2)
Basos: 1 %
EOS (ABSOLUTE): 0.3 10*3/uL (ref 0.0–0.4)
Eos: 4 %
Hematocrit: 37.2 % — ABNORMAL LOW (ref 37.5–51.0)
Hemoglobin: 12.4 g/dL — ABNORMAL LOW (ref 13.0–17.7)
Immature Grans (Abs): 0 10*3/uL (ref 0.0–0.1)
Immature Granulocytes: 0 %
Lymphocytes Absolute: 2.3 10*3/uL (ref 0.7–3.1)
Lymphs: 34 %
MCH: 29.3 pg (ref 26.6–33.0)
MCHC: 33.3 g/dL (ref 31.5–35.7)
MCV: 88 fL (ref 79–97)
Monocytes Absolute: 0.6 10*3/uL (ref 0.1–0.9)
Monocytes: 9 %
Neutrophils Absolute: 3.6 10*3/uL (ref 1.4–7.0)
Neutrophils: 52 %
Platelets: 427 10*3/uL (ref 150–450)
RBC: 4.23 x10E6/uL (ref 4.14–5.80)
RDW: 13.7 % (ref 11.6–15.4)
WBC: 6.9 10*3/uL (ref 3.4–10.8)

## 2021-01-07 LAB — C-REACTIVE PROTEIN: CRP: 13 mg/L — ABNORMAL HIGH (ref 0–10)

## 2021-01-07 LAB — SEDIMENTATION RATE: Sed Rate: 31 mm/hr — ABNORMAL HIGH (ref 0–30)

## 2021-01-09 ENCOUNTER — Telehealth: Payer: Self-pay | Admitting: Neurology

## 2021-01-09 NOTE — Telephone Encounter (Signed)
Spoke with patient. He is feeling a little better on the steroids(It has been < 72 hours since IV infusion and now on 70m/day, may take more time to see greater improvement on steroids). I do recommend then that he stay on the 629mand we can monitor how he is doing, if he improves clinically I would keep him on steroids and slowly titrate him lower. Despite ESR only being 31 and crp 13, negative [place]poral artery,if he is clinically improving then I think we take this route. [facility] and some clinical improvement.

## 2021-01-11 ENCOUNTER — Encounter: Payer: Self-pay | Admitting: Neurology

## 2021-01-13 ENCOUNTER — Other Ambulatory Visit: Payer: Self-pay | Admitting: Neurology

## 2021-01-13 MED ORDER — HYDROCODONE-ACETAMINOPHEN 7.5-325 MG PO TABS: 1.0000 | ORAL_TABLET | ORAL | 0 refills | Status: DC | PRN

## 2021-01-13 MED ORDER — NURTEC 75 MG PO TBDP: 75.0000 mg | ORAL_TABLET | Freq: Every day | ORAL | 0 refills | Status: DC | PRN

## 2021-01-14 DIAGNOSIS — S0502XD Injury of conjunctiva and corneal abrasion without foreign body, left eye, subsequent encounter: Secondary | ICD-10-CM | POA: Diagnosis not present

## 2021-01-21 DIAGNOSIS — H16143 Punctate keratitis, bilateral: Secondary | ICD-10-CM | POA: Diagnosis not present

## 2021-01-21 DIAGNOSIS — H16103 Unspecified superficial keratitis, bilateral: Secondary | ICD-10-CM | POA: Diagnosis not present

## 2021-01-21 DIAGNOSIS — S0502XD Injury of conjunctiva and corneal abrasion without foreign body, left eye, subsequent encounter: Secondary | ICD-10-CM | POA: Diagnosis not present

## 2021-01-24 ENCOUNTER — Encounter: Payer: Self-pay | Admitting: Neurology

## 2021-01-24 ENCOUNTER — Other Ambulatory Visit: Payer: Self-pay

## 2021-01-24 ENCOUNTER — Ambulatory Visit: Payer: Medicare Other | Admitting: Neurology

## 2021-01-24 VITALS — BP 124/74 | HR 54 | Ht 66.0 in | Wt 130.8 lb

## 2021-01-24 DIAGNOSIS — R519 Headache, unspecified: Secondary | ICD-10-CM | POA: Diagnosis not present

## 2021-01-24 DIAGNOSIS — L129 Pemphigoid, unspecified: Secondary | ICD-10-CM | POA: Diagnosis not present

## 2021-01-24 NOTE — Progress Notes (Signed)
[facility]    Provider:  Dr Jaynee Eagles Requesting Provider: [name], Evie Lacks, MD Primary Care Provider:  Cassandria Anger, MD  CC:  Right temporal hedache  Interval history 01/24/2021: I spoke to patient's [place] physician, we are going to try and get infusion here in the office. He also continues on Prednisone. Temple pain/headache is 85% better but he still has pain supraorbitally. Only at night or in the afternoon. But he is very improved. We discussed titrating the steroids and we will stay on 92m for one month then slowly decrease by 138mmonthly. We discussed checking hgba1c and his glucose, I recommended he check his glucose daily, he is feeling well, no problems, his eyes feel much better and are not as red or painful. We received some notes from Dr. PeSharen Counterbut no orders, I have contacted him to get specifics on the pre-treatment, the injection doses and any labwork he wants prior to infusions. He is taking omeprazole daily, taking steroids with food as well, no GI upset.   HPI:  Louis FRASIERs a [age].o. male here as requested by [name], AlEvie LacksMD for headaches. PMHx ocular pemphigoid, B12 deficiency, HTN, PAC, DM2. New onset right temporal headache, continuous, sever at times.  Headaches started 2 weeks ago, it wasn't that bad but it is worsening and now can be severe, it is in the temple and radiates to the temple and frontal area. It becomes quite severe 7-8/10. Sharp pain, shooting pain, he had prior headache in the left eye which was not similar it was ocular pain his eye was so inflamed due to the Pemphigoid. This is not the same, this is new, and is not anything he has ever experienced. He has noticed in the last 4-5 days when he eats he has jaw pain. The right eye vision is worsening. No significant history of migraines. No other significant history of headaches, he had one migraine in the remote past and this is nothing like it. The pain is continuous,  no known triggers. Alleve is not working. Had a long discussion with patient regarding possiibilites and differential. No other focal neurologic deficits, associated symptoms, inciting events or modifiable factors.  Reviewed notes, labs and imaging from outside physicians, which showed:  Personally reviewed imaging with Dr. PrMamie Nickethi(Neuroradiology) and agree with the following:mild T2 hyperintensities, mild cerebral atrophy, otherwise unremarkable for 80.   11/10/2020: TSH nml, B12 1056, cbc unremarkable, CMP elevated glucose and reduced GFR 52 otherwise normal Review of Systems: Patient complains of symptoms per HPI as well as the following symptoms: eye redness, eye pain. Pertinent negatives and positives per HPI. All others negative   Social History   Socioeconomic History  . Marital status: Married    Spouse name: Not on file  . Number of children: Not on file  . Years of education: Not on file  . Highest education level: Not on file  Occupational History  . Occupation: MD Retired  Tobacco Use  . Smoking status: Former Smoker    Types: Cigars  . Smokeless tobacco: Never Used  . Tobacco comment: none in 8 years  Vaping Use  . Vaping Use: Never used  Substance and Sexual Activity  . Alcohol use: Not Currently    Comment: maybe 1 mixed drink every 2 weeks or no   . Drug use: No    Comment: 3-4 cigars   year  . Sexual activity: Not Currently  Other Topics Concern  . Not on  file  Social History Narrative   Regular Exercise -  YES, golf   Lives at home with wife    Right handed   Caffeine: 2-3 cups/day   Social Determinants of Health   Financial Resource Strain: Not on file  Food Insecurity: Not on file  Transportation Needs: Not on file  Physical Activity: Not on file  Stress: Not on file  Social Connections: Not on file  Intimate Partner Violence: Not on file    Family History  Problem Relation Age of Onset  . Arthritis Mother   . Hypertension Father   .  Coronary artery disease Father   . Hypertension Brother   . Hypertension Other   . Hypertension Brother   . Coronary artery disease Other     Past Medical History:  Diagnosis Date  . BPH (benign prostatic hypertrophy)   . Hyperlipidemia   . Hypertension   . LBP (low back pain)   . MVP (mitral valve prolapse)   . 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 Osteoarthritis   . PAC (premature atrial contraction)   . Statin intolerance   . Type II or unspecified type diabetes mellitus without mention of complication, not stated as uncontrolled 2011  . Vitamin B 12 deficiency 2011  . Vitamin D deficiency   . Vitiligo     Patient Active Problem List   Diagnosis Date Noted  . Right sided temporal headache 01/06/2021  . Chronic renal insufficiency, stage 3 (moderate) ([place]) 05/11/2020  . Pemphigoid 05/10/2020  . Elevated serum creatinine 07/11/2018  . Hip pain, acute, right 01/10/2018  . Postoperative anemia due to acute blood loss 09/30/2014  . Status post left partial knee replacement 09/15/2014  . S/P left UKR 09/14/2014  . S/P right TKA 06/15/2014  . Osteoarthritis, knee 11/24/2013  . Weight loss 06/24/2013  . PAC (premature atrial contraction) 01/13/2013  . Heart murmur 01/13/2013  . Well adult exam 11/03/2012  . Paresthesia 10/22/2012  . Hypertension 04/18/2012  . Diabetes type 2, controlled ([place]) 10/20/2010  . B12 deficiency 10/20/2010  . Vitamin D deficiency 10/20/2010  . Dyslipidemia 07/14/2010  . BENIGN PROSTATIC HYPERTROPHY 07/14/2010  . OSTEOARTHRITIS 07/14/2010  . LOW BACK PAIN 07/14/2010  . HYPERGLYCEMIA 07/14/2010    Past Surgical History:  Procedure Laterality Date  . arm surgery Left    ORIF left  forearm  . CATARACT EXTRACTION  2012  . EYE SURGERY    . HERNIA REPAIR Bilateral 1971  . PARTIAL KNEE ARTHROPLASTY Left 09/14/2014   Procedure: LEFT UNICOMPARTMENTAL KNEE ARTHROPLASTY MEDIALLY;  Surgeon: Mauri Pole, MD;  Location: [facility];  Service: Orthopedics;  Laterality: Left;  . TOTAL KNEE ARTHROPLASTY Right 06/15/2014   Procedure: RIGHT TOTAL KNEE ARTHROPLASTY;  Surgeon: Mauri Pole, MD;  Location: [facility];  Service: Orthopedics;  Laterality: Right;    Current Outpatient Medications  Medication Sig Dispense Refill  . amLODipine (NORVASC) 2.5 MG tablet TAKE ONE TABLET BY MOUTH DAILY 90 tablet 3  . carvedilol (COREG) 12.5 MG tablet TAKE ONE TABLET BY MOUTH TWICE A DAY WITH A MEAL 180 tablet 3  . Cholecalciferol (VITAMIN D) 2000 UNITS CAPS Take 1 capsule by mouth every other day.    . cyanocobalamin (,VITAMIN B-12,) 1000 MCG/ML injection Inject 1 mL (1,000 mcg total) into the muscle every 14 (fourteen) days. 30 mL 3  . fenofibrate 160 MG tablet Take 1 tablet (160 mg total) by mouth daily. 90 tablet 3  . HYDROcodone-acetaminophen (  NORCO) 7.5-325 MG tablet Take 1-2 tablets by mouth every 4 (four) hours as needed for moderate pain. 60 tablet 0  . metFORMIN (GLUCOPHAGE) 500 MG tablet TAKE 1 TABLET BY MOUTH 3 TIMES A DAY. 270 tablet 1  . mycophenolate (CELLCEPT) 500 MG tablet Take 1,500 mg by mouth in the morning and at bedtime.    . predniSONE (DELTASONE) 20 MG tablet Take 3 tablets (60 mg total) by mouth daily with breakfast. 90 tablet 0  . Rimegepant Sulfate (NURTEC) 75 MG TBDP Take 75 mg by mouth daily as needed. For headache. One daily maximum. 8 tablet 0  . tamsulosin (FLOMAX) 0.4 MG CAPS capsule Take 1 capsule (0.4 mg total) by mouth daily. 90 capsule 3   No current facility-administered medications for this visit.    Allergies as of 01/24/2021 - Review Complete 01/24/2021  Allergen Reaction Noted  . Atorvastatin  10/20/2010  . Rosuvastatin  10/20/2010    Vitals: BP 124/74 (BP Location: Right Arm, Patient Position: Sitting)   Pulse (!) 54   Ht 5' 6"  (1.676 m)   Wt 130 lb 12.8 oz (59.3 kg)   BMI 21.11 kg/m  Last Weight:  Wt Readings from Last 1 Encounters:  01/24/21 130 lb 12.8 oz (59.3 kg)   Last  Height:   Ht Readings from Last 1 Encounters:  01/24/21 5' 6"  (1.676 m)     Physical exam: Exam: Gen: Improved today does not appear in as much pain, +lacrimation, photophobia, conversant, well nourised,  well groomed                     CV: RRR, no MRG. No Carotid Bruits. No peripheral edema, warm, nontender Eyes: Conjunctivae injection with lacrimation   Neuro: Detailed Neurologic Exam  Speech:    Speech is normal; fluent and spontaneous with normal comprehension.  Cognition:    The patient is oriented to person, place, and time;     recent and remote memory intact;     language fluent;     normal attention, concentration,     fund of knowledge Cranial Nerves:    The pupils are equal, round, and reactive to light. Could not visualize fundi due to photophobia. Visual fields are full to threat. Extraocular movements are intact. Trigeminal sensation is intact and the muscles of mastication are normal. The face is symmetric. The palate elevates in the midline. Hearing intact. Voice is normal. Shoulder shrug is normal. The tongue has normal motion without fasciculations.   Coordination:    No dysmetria or ataxia  Gait:    Normal native gait  Motor Observation:    No asymmetry, no atrophy, and no involuntary movements noted. Tone:    Normal muscle tone.    Posture:    Posture is normal. normal erect    Strength:    Strength is V/V in the upper and lower limbs.      Sensation: intact to LT     Reflex Exam:  DTR's:    Deep tendon reflexes in the upper and lower extremities are symmetrical bilaterally.   Toes:    The toes are equiv bilaterally.   Clonus:    Clonus is absent.    Assessment/Plan:   80 y.o. male here as requested by [name], Evie Lacks, MD for headaches. PMHx ocular pemphigoid, B12 deficiency, HTN, PAC, DM2. New onset right temporal headache in the setting of pemphigoid Autoimmune eye disease), continuous, severe at times over the last 2 weeks with  worsening  right eye vision and jaw pain.   I spoke to patient's [place] physician, we are going to try and get infusion here in the office. He also continues on Prednisone 4m. Temple pain/headache is 85% better but he still has pain supraorbitally however only at night or in the afternoon. But he is very improved. We discussed titrating the steroids and we will stay on 666mfor one month then slowly decrease by 1034monthly. We discussed checking hgba1c and his glucose, I recommended he check his glucose daily, he is feeling well, no problems, his eyes feel much better and are not as red or painful. We received some notes from Dr. PerSharen Counterut no orders, I have contacted him to get specifics on Rituximab including the pre-treatment, the injection doses and any labwork he wants prior to infusions. He is taking omeprazole daily, taking steroids with food as well, no GI upset.  Unclear etiology of headache but it has to be inflammatory/vascular or somehow related to the ocular pemphigoid, I doubt he would develop migraines or cluster headache at the age of 79.24till we spoke about trying Depakote or other migraine medication(I do not think this is a migraine but it still may benefit from these medications). We decided to stay with the prednisone as it is helping after only 2 weeks and can consider other medications if needed (he is not enthusiastic about topamax or gabapentin, nurtec helped a little, wonder if qulipta would be helpful or depakote)  PRIOR Patient is on cellcept so I would think Temporal Arteritis(GCA) less likely however he has a new right temporal headache (he has no significant headache history) over the last 2 weeks with jaw symptoms and worsening vision. I am going to treat him for GCA and order esr/crp however not sure they will be reliable. Will order Ultra Sound of the temporal arteries, if esr/crp elevated we can discuss a biopsy but may be elevated due to his current inflammatory eye  disease. I have contacted his ocular immunologist Dr. PerSharen Counter (Addendum: ESR 31(ULN 30), CRP 13(ULN 10), temporal artery ultrasound without evidence of halo sign)  VicSeveriano GilbertD ocular immunologist [phone] orders of the defined types were placed in this encounter.  No orders of the defined types were placed in this encounter.   Cc: [name], AleEvie LacksD,  VicSeveriano GilbertD ocular immunologist [phone] IllD  [facility] [address]i[place] Creek[place] [id number] [phone] spent 30 minutes of face-to-face and non-face-to-face time with patient on the  1. Right sided temporal headache   2. Pemphigoid    diagnosis.  This included previsit chart review, lab review, study review, order entry, electronic health record documentation, patient education on the different diagnostic and therapeutic options, counseling and coordination of care, risks and benefits of management, compliance, or risk factor reduction

## 2021-01-27 DIAGNOSIS — D509 Iron deficiency anemia, unspecified: Secondary | ICD-10-CM | POA: Diagnosis not present

## 2021-01-27 DIAGNOSIS — R634 Abnormal weight loss: Secondary | ICD-10-CM | POA: Diagnosis not present

## 2021-01-28 DIAGNOSIS — S0502XD Injury of conjunctiva and corneal abrasion without foreign body, left eye, subsequent encounter: Secondary | ICD-10-CM | POA: Diagnosis not present

## 2021-02-02 ENCOUNTER — Telehealth: Payer: Self-pay | Admitting: Neurology

## 2021-02-02 ENCOUNTER — Telehealth: Payer: Self-pay | Admitting: *Deleted

## 2021-02-02 NOTE — Telephone Encounter (Signed)
Pt accepted appointment on 02/21. He would like to know if he should start decreasing his steroid as he and Dr. Jaynee Eagles had talked about before.

## 2021-02-02 NOTE — Telephone Encounter (Signed)
Spoke with patient and advised him of plan per Dr Jaynee Eagles to decrease his steroid by 10 mg each month. He should take 50 mg for 4 weeks then decrease to 40 mg. Next appt on 2/21. Pt verbalized understanding and appreciation for the call.

## 2021-02-02 NOTE — Telephone Encounter (Addendum)
Per Dr Jaynee Eagles, we will order Truxima for patient as he has been getting this at [facility] for pemphigoid and will now try to get this here at [facility]. Truxima order form completed, signed by Dr Jaynee Eagles. Order form, insurance information, office notes given to [employer] with [employer] for PA process.   Orders: Labs- CBC, CMP and Flow Cytometry for B Cell and T cell profile Premedicate with Acetaminophen 650 mg PO 30 minutes prior to the infusion and Benadryl 50 mg PO or IVP (25 mg/min) 30 minutes prior to the infusion.  Truxima ongoing treatment of 1000 mg IV Day 1 and Day 15 every 24 weeks (no sooner than every 16 weeks) x 1 year. Mix in 250 mL NS for final concentration of 4 mg/mL.   Dx: Ocular Cicatricial Pemphigoid Allergies: Atorvastatin and Rosuvastatin

## 2021-02-02 NOTE — Telephone Encounter (Signed)
Pt called,at my last appt 1/31 Dr Jaynee Eagles told she would like to see me in 2 weeks and someone would call me to schedule an appt. No one has called me. Would like a call from the nurse.  I Louis Kemp) did not see in the 1/31 visit to schedule an appt in 2 weeks.

## 2021-02-02 NOTE — Telephone Encounter (Signed)
Yes, at last appointment we decided we would decrease by 10mg  a month so he should be on 50mg  at this time for 4 weeks then decrease to 40mg .

## 2021-02-02 NOTE — Telephone Encounter (Signed)
Can you call patient and offer him 2/21 @ 4:15 pm? Dr Jaynee Eagles said 3 weeks is fine.

## 2021-02-02 NOTE — Telephone Encounter (Signed)
Per Clearnce Sorrel, I called [facility] and asked them to release the auth for the Truxima so [facility] can obtain new auth to have infusion here.

## 2021-02-03 ENCOUNTER — Other Ambulatory Visit: Payer: Self-pay | Admitting: Neurology

## 2021-02-03 ENCOUNTER — Other Ambulatory Visit: Payer: Self-pay | Admitting: *Deleted

## 2021-02-03 MED ORDER — PREDNISONE 10 MG PO TABS: 50.0000 mg | ORAL_TABLET | Freq: Every day | ORAL | 0 refills | Status: DC

## 2021-02-03 NOTE — Progress Notes (Signed)
Prednisone 10 mg tablets ordered per v.o. Dr Jaynee Eagles. Pt now taking 50 mg x 1 month. Dr Jaynee Eagles will see him before the next refill is due. Anticipate taper of 10 mg monthly. Pt was called and notified of this change and he verbalized understanding.

## 2021-02-11 DIAGNOSIS — S0501XA Injury of conjunctiva and corneal abrasion without foreign body, right eye, initial encounter: Secondary | ICD-10-CM | POA: Diagnosis not present

## 2021-02-11 DIAGNOSIS — H02051 Trichiasis without entropian right upper eyelid: Secondary | ICD-10-CM | POA: Diagnosis not present

## 2021-02-14 ENCOUNTER — Other Ambulatory Visit: Payer: Self-pay

## 2021-02-14 ENCOUNTER — Ambulatory Visit: Payer: Medicare Other | Admitting: Neurology

## 2021-02-14 VITALS — BP 119/69 | HR 60

## 2021-02-14 DIAGNOSIS — Z79899 Other long term (current) drug therapy: Secondary | ICD-10-CM | POA: Diagnosis not present

## 2021-02-14 DIAGNOSIS — R519 Headache, unspecified: Secondary | ICD-10-CM

## 2021-02-14 MED ORDER — SUMATRIPTAN SUCCINATE 100 MG PO TABS: 100.0000 mg | ORAL_TABLET | ORAL | 12 refills | Status: DC | PRN

## 2021-02-14 MED ORDER — PANTOPRAZOLE SODIUM 20 MG PO TBEC: 20.0000 mg | DELAYED_RELEASE_TABLET | Freq: Two times a day (BID) | ORAL | 6 refills | Status: DC

## 2021-02-14 NOTE — Progress Notes (Signed)
Louis Kemp    Provider:  Dr Jaynee Eagles Requesting Provider: Alain Marion, Evie Lacks, MD Primary Care Provider:  Cassandria Anger, MD  CC:  Right temporal hedache  Interval history: The pain is on the right temple, it happens in the afternoon or overnight about the same time every day, right eye waters(both eyes water), other wise headache wakes him up if he does not take medication, his blood pressure has been a little low he was feeling weak and it was 742 systolic. Taking the amlodipine at 6pm, taking the carvedilol 6-7a, 10pm. BP 108/77 and tachycardic. He took gatorade yesterday and he was better but this morning again his BP was a little low. We decided to do nerve blocks today. We discussed topamax, CGRP meds, unfrotunately difficult to assess and treat unclear etiology of headaches - associated with his pemphigoid eye disease or is this an independent headache syndrome with associated autonomic symptoms? If no improvement with rituximab this Thursday we will change course.   Interval history 01/24/2021: I spoke to patient's [place] physician, we are going to try and get infusion here in the office. He also continues on Prednisone. Temple pain/headache is 85% better but he still has pain supraorbitally. Only at night or in the afternoon. But he is very improved. We discussed titrating the steroids and we will stay on 59m for one month then slowly decrease by 144mmonthly. We discussed checking hgba1c and his glucose, I recommended he check his glucose daily, he is feeling well, no problems, his eyes feel much better and are not as red or painful. We received some notes from Dr. PeSharen Counterbut no orders, I have contacted him to get specifics on the pre-treatment, the injection doses and any labwork he wants prior to infusions. He is taking omeprazole daily, taking steroids with food as well, no GI upset.   HPI:  Louis HINCHMANs a [age].o. male here as requested by [name], AlEvie LacksMD for headaches. PMHx ocular pemphigoid, B12 deficiency, HTN, PAC, DM2. New onset right temporal headache, continuous, sever at times.  Headaches started 2 weeks ago, it wasn't that bad but it is worsening and now can be severe, it is in the temple and radiates to the temple and frontal area. It becomes quite severe 7-8/10. Sharp pain, shooting pain, he had prior headache in the left eye which was not similar it was ocular pain his eye was so inflamed due to the Pemphigoid. This is not the same, this is new, and is not anything he has ever experienced. He has noticed in the last 4-5 days when he eats he has jaw pain. The right eye vision is worsening. No significant history of migraines. No other significant history of headaches, he had one migraine in the remote past and this is nothing like it. The pain is continuous, no known triggers. Alleve is not working. Had a long discussion with patient regarding possiibilites and differential. No other focal neurologic deficits, associated symptoms, inciting events or modifiable factors.  Reviewed notes, labs and imaging from outside physicians, which showed:  Personally reviewed imaging with Dr. PrMamie Nickethi(Neuroradiology) and agree with the following:mild T2 hyperintensities, mild cerebral atrophy, otherwise unremarkable for 80.   11/10/2020: TSH nml, B12 1056, cbc unremarkable, CMP elevated glucose and reduced GFR 52 otherwise normal Review of Systems: Patient complains of symptoms per HPI as well as the following symptoms: eye redness, eye pain. Pertinent negatives and positives per HPI. All others negative  Social History   Socioeconomic History  . Marital status: Married    Spouse name: Not on file  . Number of children: Not on file  . Years of education: Not on file  . Highest education level: Not on file  Occupational History  . Occupation: MD Retired  Tobacco Use  . Smoking status: Former Smoker    Types: Cigars  . Smokeless tobacco:  Never Used  . Tobacco comment: none in 8 years  Vaping Use  . Vaping Use: Never used  Substance and Sexual Activity  . Alcohol use: Not Currently    Comment: maybe 1 mixed drink every 2 weeks or no   . Drug use: No    Comment: 3-4 cigars   year  . Sexual activity: Not Currently  Other Topics Concern  . Not on file  Social History Narrative   Regular Exercise -  YES, golf   Lives at home with wife    Right handed   Caffeine: 2-3 cups/day   Social Determinants of Health   Financial Resource Strain: Not on file  Food Insecurity: Not on file  Transportation Needs: Not on file  Physical Activity: Not on file  Stress: Not on file  Social Connections: Not on file  Intimate Partner Violence: Not on file    Family History  Problem Relation Age of Onset  . Arthritis Mother   . Hypertension Father   . Coronary artery disease Father   . Hypertension Brother   . Hypertension Other   . Hypertension Brother   . Coronary artery disease Other     Past Medical History:  Diagnosis Date  . BPH (benign prostatic hypertrophy)   . Hyperlipidemia   . Hypertension   . LBP (low back pain)   . MVP (mitral valve prolapse)   . 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 Osteoarthritis   . PAC (premature atrial contraction)   . Statin intolerance   . Type II or unspecified type diabetes mellitus without mention of complication, not stated as uncontrolled 2011  . Vitamin B 12 deficiency 2011  . Vitamin D deficiency   . Vitiligo     Patient Active Problem List   Diagnosis Date Noted  . Right sided temporal headache 01/06/2021  . Chronic renal insufficiency, stage 3 (moderate) ([place]) 05/11/2020  . Pemphigoid 05/10/2020  . Elevated serum creatinine 07/11/2018  . Hip pain, acute, right 01/10/2018  . Postoperative anemia due to acute blood loss 09/30/2014  . Status post left partial knee replacement 09/15/2014  . S/P left UKR 09/14/2014  . S/P right TKA 06/15/2014  .  Osteoarthritis, knee 11/24/2013  . Weight loss 06/24/2013  . PAC (premature atrial contraction) 01/13/2013  . Heart murmur 01/13/2013  . Well adult exam 11/03/2012  . Paresthesia 10/22/2012  . Hypertension 04/18/2012  . Diabetes type 2, controlled ([place]) 10/20/2010  . B12 deficiency 10/20/2010  . Vitamin D deficiency 10/20/2010  . Dyslipidemia 07/14/2010  . BENIGN PROSTATIC HYPERTROPHY 07/14/2010  . OSTEOARTHRITIS 07/14/2010  . LOW BACK PAIN 07/14/2010  . HYPERGLYCEMIA 07/14/2010    Past Surgical History:  Procedure Laterality Date  . arm surgery Left    ORIF left  forearm  . CATARACT EXTRACTION  2012  . EYE SURGERY    . HERNIA REPAIR Bilateral 1971  . PARTIAL KNEE ARTHROPLASTY Left 09/14/2014   Procedure: LEFT UNICOMPARTMENTAL KNEE ARTHROPLASTY MEDIALLY;  Surgeon: Mauri Pole, MD;  Location: [facility];  Service: Orthopedics;  Laterality: Left;  .  TOTAL KNEE ARTHROPLASTY Right 06/15/2014   Procedure: RIGHT TOTAL KNEE ARTHROPLASTY;  Surgeon: Mauri Pole, MD;  Location: [facility];  Service: Orthopedics;  Laterality: Right;    Current Outpatient Medications  Medication Sig Dispense Refill  . amLODipine (NORVASC) 2.5 MG tablet TAKE ONE TABLET BY MOUTH DAILY 90 tablet 3  . carvedilol (COREG) 12.5 MG tablet TAKE ONE TABLET BY MOUTH TWICE A DAY WITH A MEAL 180 tablet 3  . Cholecalciferol (VITAMIN D) 2000 UNITS CAPS Take 1 capsule by mouth every other day.    . cyanocobalamin (,VITAMIN B-12,) 1000 MCG/ML injection Inject 1 mL (1,000 mcg total) into the muscle every 14 (fourteen) days. 30 mL 3  . fenofibrate 160 MG tablet Take 1 tablet (160 mg total) by mouth daily. 90 tablet 3  . HYDROcodone-acetaminophen (NORCO) 7.5-325 MG tablet Take 1-2 tablets by mouth every 4 (four) hours as needed for moderate pain. 60 tablet 0  . metFORMIN (GLUCOPHAGE) 500 MG tablet TAKE 1 TABLET BY MOUTH 3 TIMES A DAY. 270 tablet 1  . mycophenolate (CELLCEPT) 500 MG tablet Take 1,500 mg by mouth in the morning and  at bedtime.    . pantoprazole (PROTONIX) 20 MG tablet Take 1 tablet (20 mg total) by mouth 2 (two) times daily. 60 tablet 6  . predniSONE (DELTASONE) 10 MG tablet Take 5 tablets (50 mg total) by mouth daily with breakfast. 150 tablet 0  . SUMAtriptan (IMITREX) 100 MG tablet Take 1 tablet (100 mg total) by mouth every 2 (two) hours as needed. Max twice daily 10 tablet 12  . tamsulosin (FLOMAX) 0.4 MG CAPS capsule Take 1 capsule (0.4 mg total) by mouth daily. 90 capsule 3  . Rimegepant Sulfate (NURTEC) 75 MG TBDP Take 75 mg by mouth daily as needed. For headache. One daily maximum. 8 tablet 0   No current facility-administered medications for this visit.    Allergies as of 02/14/2021 - Review Complete 02/14/2021  Allergen Reaction Noted  . Atorvastatin  10/20/2010  . Rosuvastatin  10/20/2010    Vitals: BP 119/69 (BP Location: Right Arm, Patient Position: Sitting)   Pulse 60  Last Weight:  Wt Readings from Last 1 Encounters:  01/24/21 130 lb 12.8 oz (59.3 kg)   Last Height:   Ht Readings from Last 1 Encounters:  01/24/21 5' 6"  (1.676 m)     Physical exam: Exam: Gen: Improved today does not appear in as much pain, +lacrimation, photophobia, conversant, well nourised,  well groomed                     CV: RRR, no MRG. No Carotid Bruits. No peripheral edema, warm, nontender Eyes: Conjunctivae injection with lacrimation   Neuro: Detailed Neurologic Exam  Speech:    Speech is normal; fluent and spontaneous with normal comprehension.  Cognition:    The patient is oriented to person, place, and time;     recent and remote memory intact;     language fluent;     normal attention, concentration,     fund of knowledge Cranial Nerves:    The pupils are equal, round, and reactive to light. Could not visualize fundi due to photophobia. Visual fields are full to threat. Extraocular movements are intact. Trigeminal sensation is intact and the muscles of mastication are normal. The face  is symmetric. The palate elevates in the midline. Hearing intact. Voice is normal. Shoulder shrug is normal. The tongue has normal motion without fasciculations.   Coordination:  No dysmetria or ataxia  Gait:    Normal native gait  Motor Observation:    No asymmetry, no atrophy, and no involuntary movements noted. Tone:    Normal muscle tone.    Posture:    Posture is normal. normal erect    Strength:    Strength is V/V in the upper and lower limbs.      Sensation: intact to LT     Reflex Exam:  DTR's:    Deep tendon reflexes in the upper and lower extremities are symmetrical bilaterally.   Toes:    The toes are equiv bilaterally.   Clonus:    Clonus is absent.    Assessment/Plan:   80 y.o. male here as requested by [name], Evie Lacks, MD for headaches. PMHx ocular pemphigoid, B12 deficiency, HTN, PAC, DM2. New onset right temporal headache in the setting of pemphigoid Autoimmune eye disease), continuous, severe at times, no signifcant improvement with steroids, nerve blocks have helped  We decided to do nerve blocks today. We discussed topamax, CGRP meds, unfortunately difficult to assess and treat unclear etiology of headaches - associated with his pemphigoid eye disease or is this an independent headache syndrome with associated autonomic symptoms? If no improvement with rituximab this Thursday we will change course.   Rituximab scheduled this Thursday  I spent 30 minutes of face-to-face and non-face-to-face time with patient on the  1. Right temporal headache    diagnosis.  This included previsit chart review, lab review, study review, order entry, electronic health record documentation, patient education on the different diagnostic and therapeutic options, counseling and coordination of care, risks and benefits of management, compliance, or risk factor reduction  Performed by Dr. Jaynee Eagles M.D. 30-gauge needle was used. All procedures a documented blood were medically  necessary, reasonable and appropriate based on the patient's history, medical diagnosis and physician opinion. Verbal informed consent was obtained from the patient, patient was informed of potential risk of procedure, including bruising, bleeding, hematoma formation, infection, muscle weakness, muscle pain, numbness, transient hypertension, transient hyperglycemia and transient insomnia among others. All areas injected were topically clean with isopropyl rubbing alcohol. Nonsterile nonlatex gloves were worn during the procedure.  Pain went from 7/10 to 0/10   Supraorbital nerve block (64400): Supraorbital nerve site was identified along the incision of the frontal bone on the orbital/supraorbital ridge. Medication was injected into the right supraorbital nerve areas. Patient's condition is associated with inflammation of the supraorbital and associated muscle groups. Injection was deemed medically necessary, reasonable and appropriate. Injection represents a separate and unique surgical service.       PRIOR:   I spoke to patient's [place] physician, we are going to try and get infusion here in the office. He also continues on Prednisone 58m. Temple pain/headache is 85% better but he still has pain supraorbitally however only at night or in the afternoon. But he is very improved. We discussed titrating the steroids and we will stay on 612mfor one month then slowly decrease by 1022monthly. We discussed checking hgba1c and his glucose, I recommended he check his glucose daily, he is feeling well, no problems, his eyes feel much better and are not as red or painful. We received some notes from Dr. PerSharen Counterut no orders, I have contacted him to get specifics on Rituximab including the pre-treatment, the injection doses and any labwork he wants prior to infusions. He is taking omeprazole daily, taking steroids with food as well, no GI upset.  Unclear etiology  of headache but it has to be  inflammatory/vascular or somehow related to the ocular pemphigoid, I doubt he would develop migraines or cluster headache at the age of 41. Still we spoke about trying Depakote or other migraine medication(I do not think this is a migraine but it still may benefit from these medications). We decided to stay with the prednisone as it is helping after only 2 weeks and can consider other medications if needed (he is not enthusiastic about topamax or gabapentin, nurtec helped a little, wonder if qulipta would be helpful or depakote)  PRIOR Patient is on cellcept so I would think Temporal Arteritis(GCA) less likely however he has a new right temporal headache (he has no significant headache history) over the last 2 weeks with jaw symptoms and worsening vision. I am going to treat him for GCA and order esr/crp however not sure they will be reliable. Will order Ultra Sound of the temporal arteries, if esr/crp elevated we can discuss a biopsy but may be elevated due to his current inflammatory eye disease. I have contacted his ocular immunologist Dr. Sharen Counter.   (Addendum: ESR 31(ULN 30), CRP 13(ULN 10), temporal artery ultrasound without evidence of halo sign)  Louis Gilbert, MD ocular immunologist [phone]   No orders of the defined types were placed in this encounter.  Meds ordered this encounter  Medications  . pantoprazole (PROTONIX) 20 MG tablet    Sig: Take 1 tablet (20 mg total) by mouth 2 (two) times daily.    Dispense:  60 tablet    Refill:  6  . SUMAtriptan (IMITREX) 100 MG tablet    Sig: Take 1 tablet (100 mg total) by mouth every 2 (two) hours as needed. Max twice daily    Dispense:  10 tablet    Refill:  12    Cc: [name], Evie Lacks, MD,  Louis Gilbert, MD ocular immunologist [phone]  Sarina Ill, MD  [facility] [address] [place], [place] [address]  Phone [phone] Fax [phone]   I spent 30 minutes of face-to-face and  non-face-to-face time with patient on the  1. Right temporal headache    diagnosis.  This included previsit chart review, lab review, study review, order entry, electronic health record documentation, patient education on the different diagnostic and therapeutic options, counseling and coordination of care, risks and benefits of management, compliance, or risk factor reduction. This does not include time spent on nerve block.

## 2021-02-14 NOTE — Patient Instructions (Addendum)
In 2 weeks decrease prednisone to 40mg /day Sumatriptan: Please take one tablet at the onset of your headache. If it does not improve the symptoms please take one additional tablet. Do not take more then 2 tablets in 24hrs. Do not take use more then 2 to 3 times in a week. We will try and get you high-flow oxygen Nerve blocks today Can also consider Lidocaine nasal spray May consider trying Emgality, Topiramate as preventatives

## 2021-02-14 NOTE — Progress Notes (Addendum)
Nerve block w/o steroid:  0.5% Bupivocaine 4.5 mL LOT: [id number] EXP: 04/2022 NDC: 01779-390-30  2% Lidocaine 4.5 mL LOT: [id number] EXP: 02/2022 NDC: 22633-354-56

## 2021-02-15 ENCOUNTER — Ambulatory Visit (INDEPENDENT_AMBULATORY_CARE_PROVIDER_SITE_OTHER): Payer: Medicare Other | Admitting: Neurology

## 2021-02-15 ENCOUNTER — Encounter: Payer: Self-pay | Admitting: Neurology

## 2021-02-15 DIAGNOSIS — G500 Trigeminal neuralgia: Secondary | ICD-10-CM | POA: Diagnosis not present

## 2021-02-15 DIAGNOSIS — R519 Headache, unspecified: Secondary | ICD-10-CM

## 2021-02-15 NOTE — Progress Notes (Signed)
Performed by Dr. Jaynee Eagles M.D.4.22ml Lidocaine 1%,4.15ml Marcaine 0.5% in the 30-gauge needle was used. All procedures a documented blood were medically necessary, reasonable and appropriate based on the patient's history, medical diagnosis and physician opinion. Verbal informed consent was obtained from the patient, patient was informed of potential risk of procedure, including bruising, bleeding, hematoma formation, infection, muscle weakness, muscle pain, numbness, transient hypertension, transient hyperglycemia and transient insomnia among others. All areas injected were topically clean with isopropyl rubbing alcohol. Nonsterile nonlatex gloves were worn during the procedure.  Supraorbital nerve block (64400): Supraorbital nerve site was identified along the incision of the frontal bone on the orbital/supraorbital ridge. Medication was injected into the right supraorbital nerve areas. Patient's condition is associated with inflammation of the supraorbital and associated muscle groups. Injection was deemed medically necessary, reasonable and appropriate. Injection represents a separate and unique surgical service.  Pain went from 6/10 to 0/10

## 2021-02-16 NOTE — Progress Notes (Signed)
Nerve block w/o steroid:  0.5% Bupivocaine 4.5 mL LOT: [id number] EXP:  04/2022 NDC: 40370-964-38  2% Lidocaine 4.5 mL LOT: [id number] EXP: 02/2022 NDC: 43606-770-34

## 2021-02-17 ENCOUNTER — Other Ambulatory Visit (INDEPENDENT_AMBULATORY_CARE_PROVIDER_SITE_OTHER): Payer: Self-pay

## 2021-02-17 ENCOUNTER — Other Ambulatory Visit: Payer: Self-pay | Admitting: Neurology

## 2021-02-17 DIAGNOSIS — Z130 Encounter for screening for diseases of the blood and blood-forming organs and certain disorders involving the immune mechanism: Secondary | ICD-10-CM | POA: Diagnosis not present

## 2021-02-17 DIAGNOSIS — Z79899 Other long term (current) drug therapy: Secondary | ICD-10-CM

## 2021-02-17 DIAGNOSIS — D72819 Decreased white blood cell count, unspecified: Secondary | ICD-10-CM | POA: Diagnosis not present

## 2021-02-17 DIAGNOSIS — L121 Cicatricial pemphigoid: Secondary | ICD-10-CM | POA: Diagnosis not present

## 2021-02-17 DIAGNOSIS — D61818 Other pancytopenia: Secondary | ICD-10-CM | POA: Diagnosis not present

## 2021-02-17 DIAGNOSIS — Z0289 Encounter for other administrative examinations: Secondary | ICD-10-CM

## 2021-02-17 DIAGNOSIS — M316 Other giant cell arteritis: Secondary | ICD-10-CM | POA: Diagnosis not present

## 2021-02-17 DIAGNOSIS — R799 Abnormal finding of blood chemistry, unspecified: Secondary | ICD-10-CM | POA: Diagnosis not present

## 2021-02-18 LAB — COMPREHENSIVE METABOLIC PANEL
ALT: 21 IU/L (ref 0–44)
AST: 15 IU/L (ref 0–40)
Albumin/Globulin Ratio: 1.6 (ref 1.2–2.2)
Albumin: 3.4 g/dL — ABNORMAL LOW (ref 3.7–4.7)
Alkaline Phosphatase: 95 IU/L (ref 44–121)
BUN/Creatinine Ratio: 18 (ref 10–24)
BUN: 23 mg/dL (ref 8–27)
Bilirubin Total: 0.3 mg/dL (ref 0.0–1.2)
CO2: 21 mmol/L (ref 20–29)
Calcium: 8.6 mg/dL (ref 8.6–10.2)
Chloride: 99 mmol/L (ref 96–106)
Creatinine, Ser: 1.28 mg/dL — ABNORMAL HIGH (ref 0.76–1.27)
GFR calc Af Amer: 62 mL/min/{1.73_m2} (ref >59)
GFR calc non Af Amer: 53 mL/min/{1.73_m2} — ABNORMAL LOW (ref >59)
Globulin, Total: 2.1 g/dL (ref 1.5–4.5)
Glucose: 359 mg/dL — ABNORMAL HIGH (ref 65–99)
Potassium: 4.8 mmol/L (ref 3.5–5.2)
Sodium: 136 mmol/L (ref 134–144)
Total Protein: 5.5 g/dL — ABNORMAL LOW (ref 6.0–8.5)

## 2021-02-18 LAB — T + B-LYMPHOCYTE DIFFERENTIAL
% CD 3 Pos. Lymph.: 19.4 % — ABNORMAL LOW (ref 57.5–86.2)
% CD 4 Pos. Lymph.: 7.3 % — ABNORMAL LOW (ref 30.8–58.5)
Absolute CD 3: 485 /uL — ABNORMAL LOW (ref 622–2402)
Absolute CD 4 Helper: 183 /uL — ABNORMAL LOW (ref 359–1519)
Basophils Absolute: 0 10*3/uL (ref 0.0–0.2)
Basos: 0 %
CD19 % B Cell: 78.9 % — ABNORMAL HIGH (ref 3.3–25.4)
CD19 Abs: 1973 /uL — ABNORMAL HIGH (ref 12–645)
CD4/CD8 Ratio: 0.59 — ABNORMAL LOW (ref 0.92–3.72)
CD8 % Suppressor T Cell: 12.4 % (ref 12.0–35.5)
CD8 T Cell Abs: 310 /uL (ref 109–897)
EOS (ABSOLUTE): 0 10*3/uL (ref 0.0–0.4)
Eos: 0 %
Hematocrit: 39.8 % (ref 37.5–51.0)
Hemoglobin: 13.2 g/dL (ref 13.0–17.7)
Immature Grans (Abs): 0.1 10*3/uL (ref 0.0–0.1)
Immature Granulocytes: 1 %
Lymphocytes Absolute: 2.5 10*3/uL (ref 0.7–3.1)
Lymphs: 25 %
MCH: 29.7 pg (ref 26.6–33.0)
MCHC: 33.2 g/dL (ref 31.5–35.7)
MCV: 89 fL (ref 79–97)
Monocytes Absolute: 0.2 10*3/uL (ref 0.1–0.9)
Monocytes: 2 %
Neutrophils Absolute: 7.2 10*3/uL — ABNORMAL HIGH (ref 1.4–7.0)
Neutrophils: 72 %
Platelets: 338 10*3/uL (ref 150–450)
RBC: 4.45 x10E6/uL (ref 4.14–5.80)
RDW: 13.2 % (ref 11.6–15.4)
WBC: 10 10*3/uL (ref 3.4–10.8)

## 2021-02-25 DIAGNOSIS — S0501XA Injury of conjunctiva and corneal abrasion without foreign body, right eye, initial encounter: Secondary | ICD-10-CM | POA: Diagnosis not present

## 2021-02-25 DIAGNOSIS — H16143 Punctate keratitis, bilateral: Secondary | ICD-10-CM | POA: Diagnosis not present

## 2021-02-25 DIAGNOSIS — H16103 Unspecified superficial keratitis, bilateral: Secondary | ICD-10-CM | POA: Diagnosis not present

## 2021-03-01 DIAGNOSIS — S0502XD Injury of conjunctiva and corneal abrasion without foreign body, left eye, subsequent encounter: Secondary | ICD-10-CM | POA: Diagnosis not present

## 2021-03-01 DIAGNOSIS — H16103 Unspecified superficial keratitis, bilateral: Secondary | ICD-10-CM | POA: Diagnosis not present

## 2021-03-03 ENCOUNTER — Other Ambulatory Visit: Payer: Self-pay | Admitting: *Deleted

## 2021-03-03 DIAGNOSIS — Z130 Encounter for screening for diseases of the blood and blood-forming organs and certain disorders involving the immune mechanism: Secondary | ICD-10-CM | POA: Diagnosis not present

## 2021-03-03 DIAGNOSIS — M316 Other giant cell arteritis: Secondary | ICD-10-CM | POA: Diagnosis not present

## 2021-03-03 DIAGNOSIS — L121 Cicatricial pemphigoid: Secondary | ICD-10-CM | POA: Diagnosis not present

## 2021-03-03 DIAGNOSIS — Z79899 Other long term (current) drug therapy: Secondary | ICD-10-CM

## 2021-03-03 DIAGNOSIS — D61818 Other pancytopenia: Secondary | ICD-10-CM | POA: Diagnosis not present

## 2021-03-03 DIAGNOSIS — R799 Abnormal finding of blood chemistry, unspecified: Secondary | ICD-10-CM | POA: Diagnosis not present

## 2021-03-03 DIAGNOSIS — D72819 Decreased white blood cell count, unspecified: Secondary | ICD-10-CM | POA: Diagnosis not present

## 2021-03-03 NOTE — Addendum Note (Signed)
Addended by: Inis Sizer D on: 03/03/2021 01:42 PM   Modules accepted: Orders

## 2021-03-04 LAB — T + B-LYMPHOCYTE DIFFERENTIAL
% CD 3 Pos. Lymph.: 93.6 % — ABNORMAL HIGH (ref 57.5–86.2)
% CD 4 Pos. Lymph.: 37.4 % (ref 30.8–58.5)
Absolute CD 3: 749 /uL (ref 622–2402)
Absolute CD 4 Helper: 299 /uL — ABNORMAL LOW (ref 359–1519)
Basophils Absolute: 0 10*3/uL (ref 0.0–0.2)
Basos: 0 %
CD19 % B Cell: 0.4 % — ABNORMAL LOW (ref 3.3–25.4)
CD19 Abs: 3 /uL — ABNORMAL LOW (ref 12–645)
CD4/CD8 Ratio: 0.65 — ABNORMAL LOW (ref 0.92–3.72)
CD8 % Suppressor T Cell: 57.8 % — ABNORMAL HIGH (ref 12.0–35.5)
CD8 T Cell Abs: 462 /uL (ref 109–897)
EOS (ABSOLUTE): 0 10*3/uL (ref 0.0–0.4)
Eos: 0 %
Hematocrit: 36.2 % — ABNORMAL LOW (ref 37.5–51.0)
Hemoglobin: 12 g/dL — ABNORMAL LOW (ref 13.0–17.7)
Immature Grans (Abs): 0.1 10*3/uL (ref 0.0–0.1)
Immature Granulocytes: 2 %
Lymphocytes Absolute: 0.8 10*3/uL (ref 0.7–3.1)
Lymphs: 10 %
MCH: 29.5 pg (ref 26.6–33.0)
MCHC: 33.1 g/dL (ref 31.5–35.7)
MCV: 89 fL (ref 79–97)
Monocytes Absolute: 0.4 10*3/uL (ref 0.1–0.9)
Monocytes: 6 %
Neutrophils Absolute: 6.5 10*3/uL (ref 1.4–7.0)
Neutrophils: 82 %
Platelets: 403 10*3/uL (ref 150–450)
RBC: 4.07 x10E6/uL — ABNORMAL LOW (ref 4.14–5.80)
RDW: 13.6 % (ref 11.6–15.4)
WBC: 7.8 10*3/uL (ref 3.4–10.8)

## 2021-03-04 LAB — COMPREHENSIVE METABOLIC PANEL
ALT: 18 IU/L (ref 0–44)
AST: 14 IU/L (ref 0–40)
Albumin/Globulin Ratio: 1.8 (ref 1.2–2.2)
Albumin: 3.4 g/dL — ABNORMAL LOW (ref 3.7–4.7)
Alkaline Phosphatase: 81 IU/L (ref 44–121)
BUN/Creatinine Ratio: 12 (ref 10–24)
BUN: 15 mg/dL (ref 8–27)
Bilirubin Total: 0.3 mg/dL (ref 0.0–1.2)
CO2: 20 mmol/L (ref 20–29)
Calcium: 8.8 mg/dL (ref 8.6–10.2)
Chloride: 101 mmol/L (ref 96–106)
Creatinine, Ser: 1.24 mg/dL (ref 0.76–1.27)
Globulin, Total: 1.9 g/dL (ref 1.5–4.5)
Glucose: 228 mg/dL — ABNORMAL HIGH (ref 65–99)
Potassium: 4.5 mmol/L (ref 3.5–5.2)
Sodium: 138 mmol/L (ref 134–144)
Total Protein: 5.3 g/dL — ABNORMAL LOW (ref 6.0–8.5)
eGFR: 60 mL/min/{1.73_m2} (ref >59)

## 2021-03-10 ENCOUNTER — Ambulatory Visit: Payer: Medicare Other | Admitting: Neurology

## 2021-03-10 ENCOUNTER — Other Ambulatory Visit: Payer: Self-pay

## 2021-03-10 DIAGNOSIS — G44099 Other trigeminal autonomic cephalgias (TAC), not intractable: Secondary | ICD-10-CM

## 2021-03-10 MED ORDER — TOPIRAMATE 50 MG PO TABS: 50.0000 mg | ORAL_TABLET | Freq: Every evening | ORAL | 6 refills | Status: DC

## 2021-03-10 NOTE — Progress Notes (Signed)
Botox- 200 units x 1 vial Lot: [id number] Expiration: 02/2023 NDC: 4859-2763-94  Bacteriostatic 0.9% Sodium Chloride- 29mL total Lot: [id number] Expiration: 01/25/2022 NDC: 9444-6190-12  sample

## 2021-03-10 NOTE — Progress Notes (Signed)
Consent Form Botulism Toxin Injection For Chronic Migraine  Unusual case of a patient who was diagnosed with ocular pemphigoid.approx one year ago started on cellcept and recently started on rituximab. He subsequently developed severe periorbital pain on the right with autonomic symptoms. High dose steroids was trialed (60mg ) for over a month and he is currently on 20mg . He was started on Topamax and also trialed botox. Very unclear if this is some sort of secondary headache disorder to the ocular pemphigoid or if he has concomitant trigeminal autonomic cephalalgia, but unclear which one he fits into(cluster, vs SUNA/SUNCT, Hemicrania etc). The trigeminal autonomic cephalalgias are rare headache disorders as is ocular pemphigoid, to have both develop in a short period of time would be highly unlikely. Today trialing botox and starting topiramate. Nurtec did not help. May consider trialing Indomethacin if he will discontinue steroids, would be difficult to trial verapamil due to hypotension (per patient), may consider Emgality if ok with his [facility] ophthalmologist.  Reviewed orally with patient, additionally signature is on file:  Botulism toxin has been approved by the Federal drug administration for treatment of chronic migraine. Botulism toxin does not cure chronic migraine and it may not be effective in some patients.  The administration of botulism toxin is accomplished by injecting a small amount of toxin into the muscles of the neck and head. Dosage must be titrated for each individual. Any benefits resulting from botulism toxin tend to wear off after 3 months with a repeat injection required if benefit is to be maintained. Injections are usually done every 3-4 months with maximum effect peak achieved by about 2 or 3 weeks. Botulism toxin is expensive and you should be sure of what costs you will incur resulting from the injection.  The side effects of botulism toxin use for chronic migraine  may include:   -Transient, and usually mild, facial weakness with facial injections  -Transient, and usually mild, head or neck weakness with head/neck injections  -Reduction or loss of forehead facial animation due to forehead muscle weakness  -Eyelid drooping  -Dry eye  -Pain at the site of injection or bruising at the site of injection  -Double vision  -Potential unknown long term risks  Contraindications: You should not have Botox if you are pregnant, nursing, allergic to albumin, have an infection, skin condition, or muscle weakness at the site of the injection, or have myasthenia gravis, Lambert-Eaton syndrome, or ALS.  It is also possible that as with any injection, there may be an allergic reaction or no effect from the medication. Reduced effectiveness after repeated injections is sometimes seen and rarely infection at the injection site may occur. All care will be taken to prevent these side effects. If therapy is given over a long time, atrophy and wasting in the muscle injected may occur. Occasionally the patient's become refractory to treatment because they develop antibodies to the toxin. In this event, therapy needs to be modified.  I have read the above information and consent to the administration of botulism toxin.    BOTOX PROCEDURE NOTE FOR MIGRAINE HEADACHE    Contraindications and precautions discussed with patient(above). Aseptic procedure was observed and patient tolerated procedure. Procedure performed by Dr. Georgia Dom  The condition has existed for more than 6 months, and pt does not have a diagnosis of ALS, Myasthenia Gravis or Lambert-Eaton Syndrome.  Risks and benefits of injections discussed and pt agrees to proceed with the procedure.  Written consent obtained  These injections  are medically necessary. Pt  receives good benefits from these injections. These injections do not cause sedations or hallucinations which the oral therapies may cause.  Description  of procedure:  The patient was placed in a sitting position. The standard protocol was used for Botox as follows, with 5 units of Botox injected at each site:   -Procerus muscle, midline injection  -Corrugator muscle, bilateral injection  -Frontalis muscle, bilateral injection, with 2 sites each side, medial injection was performed in the upper one third of the frontalis muscle, in the region vertical from the medial inferior edge of the superior orbital rim. The lateral injection was again in the upper one third of the forehead vertically above the lateral limbus of the cornea, 1.5 cm lateral to the medial injection site.  -Temporalis muscle injection, 4 sites, bilaterally. The first injection was 3 cm above the tragus of the ear, second injection site was 1.5 cm to 3 cm up from the first injection site in line with the tragus of the ear. The third injection site was 1.5-3 cm forward between the first 2 injection sites. The fourth injection site was 1.5 cm posterior to the second injection site.   -Occipitalis muscle injection, 3 sites, bilaterally. The first injection was done one half way between the occipital protuberance and the tip of the mastoid process behind the ear. The second injection site was done lateral and superior to the first, 1 fingerbreadth from the first injection. The third injection site was 1 fingerbreadth superiorly and medially from the first injection site.  -Cervical paraspinal muscle injection, 2 sites, bilateral knee first injection site was 1 cm from the midline of the cervical spine, 3 cm inferior to the lower border of the occipital protuberance. The second injection site was 1.5 cm superiorly and laterally to the first injection site.  -Trapezius muscle injection was performed at 3 sites, bilaterally. The first injection site was in the upper trapezius muscle halfway between the inflection point of the neck, and the acromion. The second injection site was one half  way between the acromion and the first injection site. The third injection was done between the first injection site and the inflection point of the neck.   Will return for repeat injection in 3 months.   200 units of Botox was used, any Botox not injected was wasted. The patient tolerated the procedure well, there were no complications of the above procedure.  I spent over 40 minutes of face-to-face and non-face-to-face time with patient on the  1. Trigeminal autonomic cephalgias    diagnosis.  This included previsit chart review, lab review, study review, order entry, electronic health record documentation, patient education on the different diagnostic and therapeutic options, counseling and coordination of care, risks and benefits of management, compliance, or risk factor reduction

## 2021-03-11 DIAGNOSIS — H11233 Symblepharon, bilateral: Secondary | ICD-10-CM | POA: Diagnosis not present

## 2021-03-11 DIAGNOSIS — H16103 Unspecified superficial keratitis, bilateral: Secondary | ICD-10-CM | POA: Diagnosis not present

## 2021-03-11 DIAGNOSIS — H16143 Punctate keratitis, bilateral: Secondary | ICD-10-CM | POA: Diagnosis not present

## 2021-03-11 DIAGNOSIS — S0502XD Injury of conjunctiva and corneal abrasion without foreign body, left eye, subsequent encounter: Secondary | ICD-10-CM | POA: Diagnosis not present

## 2021-03-14 DIAGNOSIS — G44099 Other trigeminal autonomic cephalgias (TAC), not intractable: Secondary | ICD-10-CM | POA: Insufficient documentation

## 2021-03-15 DIAGNOSIS — H16143 Punctate keratitis, bilateral: Secondary | ICD-10-CM | POA: Diagnosis not present

## 2021-03-15 DIAGNOSIS — H11233 Symblepharon, bilateral: Secondary | ICD-10-CM | POA: Diagnosis not present

## 2021-03-15 DIAGNOSIS — H02051 Trichiasis without entropian right upper eyelid: Secondary | ICD-10-CM | POA: Diagnosis not present

## 2021-03-15 DIAGNOSIS — S0502XD Injury of conjunctiva and corneal abrasion without foreign body, left eye, subsequent encounter: Secondary | ICD-10-CM | POA: Diagnosis not present

## 2021-03-15 DIAGNOSIS — H16103 Unspecified superficial keratitis, bilateral: Secondary | ICD-10-CM | POA: Diagnosis not present

## 2021-03-25 DIAGNOSIS — H16103 Unspecified superficial keratitis, bilateral: Secondary | ICD-10-CM | POA: Diagnosis not present

## 2021-03-25 DIAGNOSIS — H5711 Ocular pain, right eye: Secondary | ICD-10-CM | POA: Diagnosis not present

## 2021-03-25 DIAGNOSIS — H11233 Symblepharon, bilateral: Secondary | ICD-10-CM | POA: Diagnosis not present

## 2021-03-25 DIAGNOSIS — H16143 Punctate keratitis, bilateral: Secondary | ICD-10-CM | POA: Diagnosis not present

## 2021-03-28 ENCOUNTER — Other Ambulatory Visit: Payer: Self-pay | Admitting: Neurology

## 2021-03-28 MED ORDER — HYDROCODONE-ACETAMINOPHEN 7.5-325 MG PO TABS: 1.0000 | ORAL_TABLET | ORAL | 0 refills | Status: DC | PRN

## 2021-04-01 DIAGNOSIS — H11233 Symblepharon, bilateral: Secondary | ICD-10-CM | POA: Diagnosis not present

## 2021-04-01 DIAGNOSIS — H02054 Trichiasis without entropian left upper eyelid: Secondary | ICD-10-CM | POA: Diagnosis not present

## 2021-04-01 DIAGNOSIS — H16143 Punctate keratitis, bilateral: Secondary | ICD-10-CM | POA: Diagnosis not present

## 2021-04-01 DIAGNOSIS — H16103 Unspecified superficial keratitis, bilateral: Secondary | ICD-10-CM | POA: Diagnosis not present

## 2021-04-05 ENCOUNTER — Ambulatory Visit: Payer: Medicare Other | Admitting: Neurology

## 2021-04-05 ENCOUNTER — Other Ambulatory Visit: Payer: Self-pay

## 2021-04-05 ENCOUNTER — Encounter: Payer: Self-pay | Admitting: Neurology

## 2021-04-05 VITALS — BP 120/76 | HR 63 | Ht 66.0 in | Wt 127.6 lb

## 2021-04-05 DIAGNOSIS — G4489 Other headache syndrome: Secondary | ICD-10-CM

## 2021-04-05 MED ORDER — PREDNISONE 5 MG PO TABS: 15.0000 mg | ORAL_TABLET | Freq: Every day | ORAL | 3 refills | Status: DC

## 2021-04-05 MED ORDER — ZONISAMIDE 25 MG PO CAPS
ORAL_CAPSULE | ORAL | 1 refills | Status: DC
Start: 2021-04-05 — End: 2021-05-12

## 2021-04-05 MED ORDER — PREDNISONE 2.5 MG PO TABS: ORAL_TABLET | ORAL | 1 refills | Status: DC

## 2021-04-05 NOTE — Progress Notes (Signed)
Reason for visit: Headache syndrome  Louis Kemp is a 80 y.o. 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  The patient has been on high-dose CellCept taking 1500 mg twice daily.  The patient does have a history of diabetes and hypertension.  In January 2022, the patient had relatively sudden onset of problems with pain in the right frontotemporal area.  The pain was constant and boring in nature, there is some question that he was having some jaw claudication symptoms as well.  The patient was placed on 60 mg daily of prednisone and has gradually tapered down to 20 mg a day.  There was some benefit with the prednisone with headache, but the headache continues.  The headache is essentially every day.  The characteristics of the headaches have changed in the last couple weeks where the patient will have chewing as an activator for the headache starting out with sharp jabbing pains in the frontotemporal area, then the pain will become a constant boring sensation again.  The patient notes a moderate level of pain at all times.  Sometimes the pain can be associated with some nausea, he may have some phonophobia as well.  The patient was given a trial on Topamax which he believes seem to help, but he could take the medication only for about 4 days before he got shortness of breath and some urinary problems from this.  The patient was given a trial on Botox which seemed to reduce the severity of the headache but not eliminate the headache.  The patient initially noted that the pain would oftentimes wake him up at night.  This is not happening as much now.  The patient believes it may be some increased tearing from the eyes, but he has tearing anyway with the ocular pemphigoid.  He denies any actual ocular pain.  There was some concern for temporal arteritis, a Doppler study was unremarkable.  The  patient has had MRI of the brain that was unrevealing.  Sedimentation rate was slightly elevated at 31, C-reactive protein was 13, slightly elevated.  The patient has not yet been given a trial on indomethacin as he was on prednisone.  He is being reevaluated for his current headache issue.  Past Medical History:  Diagnosis Date  . BPH (benign prostatic hypertrophy)   . Hyperlipidemia   . Hypertension   . LBP (low back pain)   . MVP (mitral valve prolapse)   . 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 Osteoarthritis   . PAC (premature atrial contraction)   . Statin intolerance   . Type II or unspecified type diabetes mellitus without mention of complication, not stated as uncontrolled 2011  . Vitamin B 12 deficiency 2011  . Vitamin D deficiency   . Vitiligo     Past Surgical History:  Procedure Laterality Date  . arm surgery Left    ORIF left  forearm  . CATARACT EXTRACTION  2012  . EYE SURGERY    . HERNIA REPAIR Bilateral 1971  . PARTIAL KNEE ARTHROPLASTY Left 09/14/2014   Procedure: LEFT UNICOMPARTMENTAL KNEE ARTHROPLASTY MEDIALLY;  Surgeon: Mauri Pole, MD;  Location: [facility];  Service: Orthopedics;  Laterality: Left;  . TOTAL KNEE ARTHROPLASTY Right 06/15/2014   Procedure: RIGHT TOTAL KNEE ARTHROPLASTY;  Surgeon: Mauri Pole, MD;  Location: [facility];  Service: Orthopedics;  Laterality: Right;    Family History  Problem Relation Age of Onset  . Arthritis Mother   . Hypertension Father   . Coronary artery disease Father   . Hypertension Brother   . Hypertension Other   . Hypertension Brother   . Coronary artery disease Other     Social history:  reports that he has quit smoking. His smoking use included cigars. He has never used smokeless tobacco. He reports previous alcohol use. He reports that he does not use drugs.  Medications:  Prior to Admission medications   Medication Sig Start Date End Date Taking? Authorizing Provider  amLODipine  (NORVASC) 2.5 MG tablet TAKE ONE TABLET BY MOUTH DAILY 07/05/20  Yes [name], Evie Lacks, MD  carvedilol (COREG) 12.5 MG tablet TAKE ONE TABLET BY MOUTH TWICE A DAY WITH A MEAL 05/26/20  Yes [name], Evie Lacks, MD  Cholecalciferol (VITAMIN D) 2000 UNITS CAPS Take 1 capsule by mouth every other day.   Yes [provider]  cyanocobalamin (,VITAMIN B-12,) 1000 MCG/ML injection Inject 1 mL (1,000 mcg total) into the muscle every 14 (fourteen) days. 05/26/20  Yes [name], Evie Lacks, MD  fenofibrate 160 MG tablet Take 1 tablet (160 mg total) by mouth daily. 05/26/20  Yes [name], Evie Lacks, MD  HYDROcodone-acetaminophen (NORCO) 7.5-325 MG tablet Take 1-2 tablets by mouth every 4 (four) hours as needed for moderate pain. 03/28/21  Yes Melvenia Beam, MD  metFORMIN (GLUCOPHAGE) 500 MG tablet TAKE 1 TABLET BY MOUTH 3 TIMES A DAY. 11/10/20  Yes [name], Evie Lacks, MD  mycophenolate (CELLCEPT) 500 MG tablet Take 1,500 mg by mouth in the morning and at bedtime. 02/03/20  Yes [provider]  omeprazole (PRILOSEC) 20 MG capsule Take 20 mg by mouth daily.   Yes [provider]  predniSONE (DELTASONE) 10 MG tablet Take 5 tablets (50 mg total) by mouth daily with breakfast. Patient taking differently: Take 20 mg by mouth daily with breakfast. 02/03/21  Yes Melvenia Beam, MD  sucralfate (CARAFATE) 1 g tablet Take 1 g by mouth 4 (four) times daily.   Yes [provider]  tamsulosin (FLOMAX) 0.4 MG CAPS capsule Take 1 capsule (0.4 mg total) by mouth daily. 05/18/20  Yes [name], Evie Lacks, MD  pantoprazole (PROTONIX) 20 MG tablet Take 1 tablet (20 mg total) by mouth 2 (two) times daily. 02/14/21   Melvenia Beam, MD  SUMAtriptan (IMITREX) 100 MG tablet Take 1 tablet (100 mg total) by mouth every 2 (two) hours as needed. Max twice daily 02/14/21   Melvenia Beam, MD  topiramate (TOPAMAX) 50 MG tablet Take 1 tablet (50 mg total) by mouth at bedtime. 03/10/21   Melvenia Beam, MD       Allergies  Allergen Reactions  . Atorvastatin     REACTION: aches  . Rosuvastatin     REACTION: aches    ROS:  Out of a complete 14 system review of symptoms, the patient complains only of the following symptoms, and all other reviewed systems are negative.  Headache Jaw discomfort Visual blurring  Blood pressure 120/76, pulse 63, height 5\' 6"  (1.676 m), weight 127 lb 9.6 oz (57.9 kg).  Physical Exam  General: The patient is alert and cooperative at the time of the examination.  Eyes: Pupils are equal, round, and reactive to light. Discs are not well-visualized on either side.  Neck: The neck is supple, no carotid bruits are noted.  Respiratory: The respiratory examination is clear.  Cardiovascular: The cardiovascular examination reveals a regular rate and rhythm, no obvious murmurs or rubs are noted.  Skin: Extremities are without significant edema.  Neurologic Exam  Mental status: The patient is alert and oriented x 3 at the time of the examination. The patient has apparent normal recent and remote memory, with an apparently normal attention span and concentration ability.  Cranial nerves: Facial symmetry is present. There is good sensation of the face to pinprick and soft touch bilaterally. The strength of the facial muscles and the muscles to head turning and shoulder shrug are normal bilaterally. Speech is well enunciated, no aphasia or dysarthria is noted. Extraocular movements are full. Visual fields are full. The tongue is midline, and the patient has symmetric elevation of the soft palate. No obvious hearing deficits are noted.  Motor: The motor testing reveals 5 over 5 strength of all 4 extremities. Good symmetric motor tone is noted throughout.  Sensory: Sensory testing is intact to pinprick, soft touch, vibration sensation, and position sense on all 4 extremities. No evidence of extinction is noted.  Coordination: Cerebellar testing reveals good  finger-nose-finger and heel-to-shin bilaterally.  Gait and station: Gait is normal. Tandem gait is unsteady. Romberg is negative. No drift is seen.  Reflexes: Deep tendon reflexes are symmetric, but are somewhat depressed bilaterally. Toes are downgoing bilaterally.   MRI brain 01/06/21:  IMPRESSION: Unremarkable MRI scan of the brain showing changes of age-appropriate chronic small vessel disease and generalized cerebral atrophy.  The study was done with and without contrast but postcontrast images,SWI and T 1 images  were not available at the time of this dictation and an addendum will be dictated once these films become available.  * MRI scan images were reviewed online. I agree with the written report.    Assessment/Plan:  1.  Right frontotemporal headache  2.  Ocular pemphigoid  The patient describes a headache primarily as a constant boring pain.  Certainly, the possibility of hemicrania continua does need to be considered.  A trial indomethacin at some point in future may be of some diagnostic benefit.  The patient will be tapered on the prednisone to 17.5 mg daily for 2 weeks and then go to 15 mg daily.  If he continues to do well, we will continue to taper the prednisone.  We will give him a trial on Zonegran as Topamax seem to help the headache some.  He does not wish to try Depakote which sometimes may help sympathetic mediated headaches.  We may try gabapentin in the future.  The patient will be sent for a carotid Doppler study to exclude a carotid dissection or intrinsic carotid disease that may be leading to a frontotemporal headache with sympathetic features.  Jill Alexanders MD 04/05/2021 3:33 PM  [facility] [address] [place], [place] [address]  Phone [phone] Fax [phone]

## 2021-04-07 DIAGNOSIS — H11233 Symblepharon, bilateral: Secondary | ICD-10-CM | POA: Diagnosis not present

## 2021-04-07 DIAGNOSIS — L121 Cicatricial pemphigoid: Secondary | ICD-10-CM | POA: Diagnosis not present

## 2021-04-07 DIAGNOSIS — H16223 Keratoconjunctivitis sicca, not specified as Sjogren's, bilateral: Secondary | ICD-10-CM | POA: Diagnosis not present

## 2021-04-07 DIAGNOSIS — Z79899 Other long term (current) drug therapy: Secondary | ICD-10-CM | POA: Diagnosis not present

## 2021-04-07 DIAGNOSIS — H1089 Other conjunctivitis: Secondary | ICD-10-CM | POA: Diagnosis not present

## 2021-04-15 ENCOUNTER — Ambulatory Visit (HOSPITAL_COMMUNITY)
Admission: RE | Admit: 2021-04-15 | Discharge: 2021-04-15 | Disposition: A | Discharge status: Home / Self Care | Payer: Medicare Other | Source: Ambulatory Visit | Attending: Neurology | Admitting: Neurology

## 2021-04-15 ENCOUNTER — Other Ambulatory Visit: Payer: Self-pay

## 2021-04-15 ENCOUNTER — Telehealth: Payer: Self-pay | Admitting: Neurology

## 2021-04-15 DIAGNOSIS — G4489 Other headache syndrome: Secondary | ICD-10-CM | POA: Insufficient documentation

## 2021-04-15 DIAGNOSIS — H02051 Trichiasis without entropian right upper eyelid: Secondary | ICD-10-CM | POA: Diagnosis not present

## 2021-04-15 DIAGNOSIS — H02054 Trichiasis without entropian left upper eyelid: Secondary | ICD-10-CM | POA: Diagnosis not present

## 2021-04-15 NOTE — Progress Notes (Signed)
Carotid duplex bilateral study completed.   Please see CV Proc for preliminary results.   Rael Yo, RDMS, RVT  

## 2021-04-15 NOTE — Telephone Encounter (Signed)
I called the patient.  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

## 2021-05-02 ENCOUNTER — Telehealth: Payer: Self-pay | Admitting: Neurology

## 2021-05-02 NOTE — Telephone Encounter (Signed)
I think it would be ok to decrease to 10mg  and send in a prescription please if he is ok with this dose.

## 2021-05-02 NOTE — Telephone Encounter (Signed)
I spoke with the patient and scheduled him for Thursday, May 26 at 10:45 AM.  Patient unable to come on Thursday, May 19.  I will let Dr. Jaynee Eagles. We will watch out for any sooner availability.  Patient is fine with this appointment time and he verbalized appreciation.  He also stated that the last time he was here the dosage of Prednisone was decreased to 15 mg.  He asked if he should go to 12.5 mg next week or 10 mg?  If so we will need to send in a smaller tablet strength for him.

## 2021-05-02 NOTE — Telephone Encounter (Signed)
I will call him. He is currently using 5 mg tablets.

## 2021-05-02 NOTE — Telephone Encounter (Signed)
Patient called and would like a follow up with me. Do we have anything open for [name] next week?

## 2021-05-03 MED ORDER — PREDNISONE 5 MG PO TABS: 10.0000 mg | ORAL_TABLET | Freq: Every day | ORAL | 0 refills | Status: DC

## 2021-05-03 NOTE — Telephone Encounter (Signed)
Spoke with patient and advised per Dr Jaynee Eagles, patient can decrease his dose of prednisone next week to 10 mg daily. Patient verbalized understanding and asked for a refill sent to pharmacy.

## 2021-05-03 NOTE — Telephone Encounter (Signed)
Prednisone 10 mg daily (using 5 mg tablets) sent to pharmacy.

## 2021-05-03 NOTE — Addendum Note (Signed)
Addended by: Gildardo Griffes on: 05/03/2021 04:22 PM   Modules accepted: Orders

## 2021-05-06 DIAGNOSIS — H168 Other keratitis: Secondary | ICD-10-CM | POA: Diagnosis not present

## 2021-05-06 DIAGNOSIS — H02054 Trichiasis without entropian left upper eyelid: Secondary | ICD-10-CM | POA: Diagnosis not present

## 2021-05-06 DIAGNOSIS — H11233 Symblepharon, bilateral: Secondary | ICD-10-CM | POA: Diagnosis not present

## 2021-05-06 DIAGNOSIS — H16103 Unspecified superficial keratitis, bilateral: Secondary | ICD-10-CM | POA: Diagnosis not present

## 2021-05-06 DIAGNOSIS — H16143 Punctate keratitis, bilateral: Secondary | ICD-10-CM | POA: Diagnosis not present

## 2021-05-06 DIAGNOSIS — H02051 Trichiasis without entropian right upper eyelid: Secondary | ICD-10-CM | POA: Diagnosis not present

## 2021-05-06 LAB — HM DIABETES EYE EXAM

## 2021-05-09 ENCOUNTER — Encounter: Payer: Self-pay | Admitting: Internal Medicine

## 2021-05-10 ENCOUNTER — Ambulatory Visit: Payer: Medicare Other | Admitting: Internal Medicine

## 2021-05-12 ENCOUNTER — Encounter: Payer: Self-pay | Admitting: Internal Medicine

## 2021-05-12 ENCOUNTER — Other Ambulatory Visit: Payer: Self-pay

## 2021-05-12 ENCOUNTER — Ambulatory Visit (INDEPENDENT_AMBULATORY_CARE_PROVIDER_SITE_OTHER): Payer: Medicare Other | Admitting: Internal Medicine

## 2021-05-12 VITALS — BP 128/64 | HR 75 | Temp 97.8°F | Ht 66.0 in | Wt 131.6 lb

## 2021-05-12 DIAGNOSIS — G44099 Other trigeminal autonomic cephalgias (TAC), not intractable: Secondary | ICD-10-CM | POA: Diagnosis not present

## 2021-05-12 DIAGNOSIS — J321 Chronic frontal sinusitis: Secondary | ICD-10-CM | POA: Diagnosis not present

## 2021-05-12 DIAGNOSIS — Z23 Encounter for immunization: Secondary | ICD-10-CM

## 2021-05-12 DIAGNOSIS — E119 Type 2 diabetes mellitus without complications: Secondary | ICD-10-CM

## 2021-05-12 DIAGNOSIS — L129 Pemphigoid, unspecified: Secondary | ICD-10-CM | POA: Diagnosis not present

## 2021-05-12 DIAGNOSIS — J329 Chronic sinusitis, unspecified: Secondary | ICD-10-CM | POA: Insufficient documentation

## 2021-05-12 DIAGNOSIS — E538 Deficiency of other specified B group vitamins: Secondary | ICD-10-CM | POA: Diagnosis not present

## 2021-05-12 DIAGNOSIS — F32A Depression, unspecified: Secondary | ICD-10-CM | POA: Insufficient documentation

## 2021-05-12 DIAGNOSIS — F329 Major depressive disorder, single episode, unspecified: Secondary | ICD-10-CM

## 2021-05-12 LAB — CBC WITH DIFFERENTIAL/PLATELET
Basophils Absolute: 0 10*3/uL (ref 0.0–0.1)
Basophils Relative: 0.3 % (ref 0.0–3.0)
Eosinophils Absolute: 0 10*3/uL (ref 0.0–0.7)
Eosinophils Relative: 0.4 % (ref 0.0–5.0)
HCT: 35.9 % — ABNORMAL LOW (ref 39.0–52.0)
Hemoglobin: 11.9 g/dL — ABNORMAL LOW (ref 13.0–17.0)
Lymphocytes Relative: 21.7 % (ref 12.0–46.0)
Lymphs Abs: 1.9 10*3/uL (ref 0.7–4.0)
MCHC: 33.1 g/dL (ref 30.0–36.0)
MCV: 91.6 fl (ref 78.0–100.0)
Monocytes Absolute: 0.5 10*3/uL (ref 0.1–1.0)
Monocytes Relative: 5.6 % (ref 3.0–12.0)
Neutro Abs: 6.4 10*3/uL (ref 1.4–7.7)
Neutrophils Relative %: 72 % (ref 43.0–77.0)
Platelets: 289 10*3/uL (ref 150.0–400.0)
RBC: 3.92 Mil/uL — ABNORMAL LOW (ref 4.22–5.81)
RDW: 14.6 % (ref 11.5–15.5)
WBC: 8.9 10*3/uL (ref 4.0–10.5)

## 2021-05-12 LAB — COMPREHENSIVE METABOLIC PANEL
ALT: 20 U/L (ref 0–53)
AST: 17 U/L (ref 0–37)
Albumin: 3.6 g/dL (ref 3.5–5.2)
Alkaline Phosphatase: 74 U/L (ref 39–117)
BUN: 17 mg/dL (ref 6–23)
CO2: 29 mEq/L (ref 19–32)
Calcium: 9 mg/dL (ref 8.4–10.5)
Chloride: 104 mEq/L (ref 96–112)
Creatinine, Ser: 1.3 mg/dL (ref 0.40–1.50)
GFR: 52.4 mL/min — ABNORMAL LOW (ref >60.00)
Glucose, Bld: 204 mg/dL — ABNORMAL HIGH (ref 70–99)
Potassium: 4.4 mEq/L (ref 3.5–5.1)
Sodium: 139 mEq/L (ref 135–145)
Total Bilirubin: 0.5 mg/dL (ref 0.2–1.2)
Total Protein: 5.5 g/dL — ABNORMAL LOW (ref 6.0–8.3)

## 2021-05-12 LAB — HEMOGLOBIN A1C: Hgb A1c MFr Bld: 8.1 % — ABNORMAL HIGH (ref 4.6–6.5)

## 2021-05-12 MED ORDER — CEFDINIR 300 MG PO CAPS: 300.0000 mg | ORAL_CAPSULE | Freq: Two times a day (BID) | ORAL | 0 refills | Status: DC

## 2021-05-12 MED ORDER — BUTALBITAL-APAP-CAFFEINE 50-325-40 MG PO TABS: 1.0000 | ORAL_TABLET | Freq: Four times a day (QID) | ORAL | 0 refills | Status: DC | PRN

## 2021-05-12 MED ORDER — ESCITALOPRAM OXALATE 5 MG PO TABS: 5.0000 mg | ORAL_TABLET | Freq: Every day | ORAL | 5 refills | Status: DC

## 2021-05-12 NOTE — Assessment & Plan Note (Signed)
On B12 

## 2021-05-12 NOTE — Assessment & Plan Note (Signed)
Lexapro at [facility]

## 2021-05-12 NOTE — Patient Instructions (Signed)
Try trekking poles 

## 2021-05-12 NOTE — Assessment & Plan Note (Signed)
Possible Empiric Omnicef x 10 d

## 2021-05-12 NOTE — Progress Notes (Signed)
Subjective:  Patient ID: Louis Kemp, male    DOB: 1941/06/21  Age: 80 y.o. MRN: [id number]  CC: Follow-up (6 month f/u)   HPI [name] presents for severe HA on the R frontal region sharp short then dull long pain 3 months+. Botox inj were tried. Pt saw Dr Jaynee Eagles, had an MRI. Steroids did not help at 60 mg/d. Now on Prednisone 10 mg/d. [name] would like to try an abx empirically.  Follow-up on diabetes, pemphigoid, hypertension  Outpatient Medications Prior to Visit  Medication Sig Dispense Refill  . amLODipine (NORVASC) 2.5 MG tablet TAKE ONE TABLET BY MOUTH DAILY 90 tablet 3  . carvedilol (COREG) 12.5 MG tablet TAKE ONE TABLET BY MOUTH TWICE A DAY WITH A MEAL 180 tablet 3  . Cholecalciferol (VITAMIN D) 2000 UNITS CAPS Take 1 capsule by mouth every other day.    . cyanocobalamin (,VITAMIN B-12,) 1000 MCG/ML injection Inject 1 mL (1,000 mcg total) into the muscle every 14 (fourteen) days. 30 mL 3  . fenofibrate 160 MG tablet Take 1 tablet (160 mg total) by mouth daily. 90 tablet 3  . HYDROcodone-acetaminophen (NORCO) 7.5-325 MG tablet Take 1-2 tablets by mouth every 4 (four) hours as needed for moderate pain. 60 tablet 0  . metFORMIN (GLUCOPHAGE) 500 MG tablet TAKE 1 TABLET BY MOUTH 3 TIMES A DAY. 270 tablet 1  . omeprazole (PRILOSEC) 20 MG capsule Take 20 mg by mouth daily.    . predniSONE (DELTASONE) 5 MG tablet Take 2 tablets (10 mg total) by mouth daily with breakfast. 90 tablet 0  . sucralfate (CARAFATE) 1 g tablet Take 1 g by mouth 4 (four) times daily.    . tamsulosin (FLOMAX) 0.4 MG CAPS capsule Take 1 capsule (0.4 mg total) by mouth daily. 90 capsule 3  . mycophenolate (CELLCEPT) 500 MG tablet Take 1,500 mg by mouth in the morning and at bedtime. (Patient not taking: Reported on 05/12/2021)    . predniSONE (DELTASONE) 2.5 MG tablet One tablet daily for 2 weeks, then stop (Patient not taking: Reported on 05/12/2021) 15 tablet 1  . zonisamide (ZONEGRAN) 25 MG capsule One  capsule twice a day for 1 week, then take 2 twice a day (Patient not taking: Reported on 05/12/2021) 120 capsule 1   No facility-administered medications prior to visit.    ROS: Review of Systems  Constitutional: Positive for fatigue. Negative for appetite change and unexpected weight change.  HENT: Negative for congestion, nosebleeds, sneezing, sore throat and trouble swallowing.   Eyes: Positive for visual disturbance. Negative for itching.  Respiratory: Negative for cough.   Cardiovascular: Negative for chest pain, palpitations and leg swelling.  Gastrointestinal: Negative for abdominal distention, blood in stool, diarrhea and nausea.  Genitourinary: Negative for frequency and hematuria.  Musculoskeletal: Positive for gait problem. Negative for back pain, joint swelling and neck pain.  Skin: Negative for rash.  Neurological: Positive for headaches. Negative for dizziness, tremors, speech difficulty and weakness.  Psychiatric/Behavioral: Negative for agitation, dysphoric mood and sleep disturbance. The patient is not nervous/anxious.     Objective:  BP 128/64 (BP Location: Left Arm)   Pulse 75   Temp 97.8 F (36.6 C) (Oral)   Ht 5\' 6"  (1.676 m)   Wt 131 lb 9.6 oz (59.7 kg)   SpO2 97%   BMI 21.24 kg/m   BP Readings from Last 3 Encounters:  05/12/21 128/64  04/05/21 120/76  02/14/21 119/69    Wt Readings from Last 3 Encounters:  05/12/21 131 lb 9.6 oz (59.7 kg)  04/05/21 127 lb 9.6 oz (57.9 kg)  01/24/21 130 lb 12.8 oz (59.3 kg)    Physical Exam Constitutional:      General: He is not in acute distress.    Appearance: He is well-developed.     Comments: NAD  Eyes:     Conjunctiva/sclera: Conjunctivae normal.     Pupils: Pupils are equal, round, and reactive to light.  Neck:     Thyroid: No thyromegaly.     Vascular: No JVD.  Cardiovascular:     Rate and Rhythm: Normal rate and regular rhythm.     Heart sounds: Normal heart sounds. No murmur heard. No friction  rub. No gallop.   Pulmonary:     Effort: Pulmonary effort is normal. No respiratory distress.     Breath sounds: Normal breath sounds. No wheezing or rales.  Chest:     Chest wall: No tenderness.  Abdominal:     General: Bowel sounds are normal. There is no distension.     Palpations: Abdomen is soft. There is no mass.     Tenderness: There is no abdominal tenderness. There is no guarding or rebound.  Musculoskeletal:        General: No tenderness. Normal range of motion.     Cervical back: Normal range of motion.  Lymphadenopathy:     Cervical: No cervical adenopathy.  Skin:    General: Skin is warm and dry.     Findings: No rash.  Neurological:     Mental Status: He is alert and oriented to person, place, and time.     Cranial Nerves: No cranial nerve deficit.     Motor: No abnormal muscle tone.     Coordination: Coordination normal.     Gait: Gait normal.     Deep Tendon Reflexes: Reflexes are normal and symmetric.  Psychiatric:        Behavior: Behavior normal.        Thought Content: Thought content normal.        Judgment: Judgment normal.    The patient is thin, he is using a cane to help him walk Lab Results  Component Value Date   WBC 7.8 03/03/2021   HGB 12.0 (L) 03/03/2021   HCT 36.2 (L) 03/03/2021   PLT 403 03/03/2021   GLUCOSE 228 (H) 03/03/2021   CHOL 160 11/10/2020   TRIG 139.0 11/10/2020   HDL 38.20 (L) 11/10/2020   LDLCALC 94 11/10/2020   ALT 18 03/03/2021   AST 14 03/03/2021   NA 138 03/03/2021   K 4.5 03/03/2021   CL 101 03/03/2021   CREATININE 1.24 03/03/2021   BUN 15 03/03/2021   CO2 20 03/03/2021   TSH 1.04 11/10/2020   PSA 0.63 11/10/2020   INR 1.01 09/03/2014   HGBA1C 6.1 11/10/2020   MICROALBUR 1.8 11/10/2020    VAS US CAROTID  Result Date: 04/15/2021 Carotid Arterial Duplex Study Patient Name:  Louis Kemp  Date of Exam:   04/15/2021 Medical Rec #: [id number]       Accession #:    [id number] Date of Birth: 1941/05/23        Patient  Gender: M Patient Age:   80Y Exam Location:  [facility] Procedure:      VAS US CAROTID Referring Phys: Sallye Lat CHARLES K WILLIS --------------------------------------------------------------------------------  Indications:      Persistent headaches, visual disturbance. Risk Factors:     Hypertension, hyperlipidemia, Diabetes, past history of  smoking. Comparison Study: 01-07-2021 Temporal artery ultrasound bilateral Performing Technologist: Darlin Coco RDMS,RVT  Examination Guidelines: A complete evaluation includes B-mode imaging, spectral Doppler, color Doppler, and power Doppler as needed of all accessible portions of each vessel. Bilateral testing is considered an integral part of a complete examination. Limited examinations for reoccurring indications may be performed as noted.  Right Carotid Findings: +----------+--------+--------+--------+------------------+--------+           PSV cm/sEDV cm/sStenosisPlaque DescriptionComments +----------+--------+--------+--------+------------------+--------+ CCA Prox  107     26                                         +----------+--------+--------+--------+------------------+--------+ CCA Distal78      26              heterogenous               +----------+--------+--------+--------+------------------+--------+ ICA Prox  62      20      1-39%   calcific                   +----------+--------+--------+--------+------------------+--------+ ICA Distal74      34                                         +----------+--------+--------+--------+------------------+--------+ ECA       90      15                                         +----------+--------+--------+--------+------------------+--------+ +----------+--------+-------+----------------+-------------------+           PSV cm/sEDV cmsDescribe        Arm Pressure (mmHG) +----------+--------+-------+----------------+-------------------+ Subclavian150             Multiphasic, WNL                    +----------+--------+-------+----------------+-------------------+ +---------+--------+--+--------+--+---------+ VertebralPSV cm/s53EDV cm/s19Antegrade +---------+--------+--+--------+--+---------+  Left Carotid Findings: +----------+--------+--------+--------+------------------+--------+           PSV cm/sEDV cm/sStenosisPlaque DescriptionComments +----------+--------+--------+--------+------------------+--------+ CCA Prox  82      13                                         +----------+--------+--------+--------+------------------+--------+ CCA Distal68      12                                         +----------+--------+--------+--------+------------------+--------+ ICA Prox  47      14      1-39%   heterogenous               +----------+--------+--------+--------+------------------+--------+ ICA Distal83      34                                         +----------+--------+--------+--------+------------------+--------+ ECA       104     10                                         +----------+--------+--------+--------+------------------+--------+ +----------+--------+--------+----------------+-------------------+  PSV cm/sEDV cm/sDescribe        Arm Pressure (mmHG) +----------+--------+--------+----------------+-------------------+ [id number]             Multiphasic, WNL                    +----------+--------+--------+----------------+-------------------+ +---------+--------+--+--------+--+---------+ VertebralPSV cm/s56EDV cm/s15Antegrade +---------+--------+--+--------+--+---------+   Summary: Right Carotid: Velocities in the right ICA are consistent with a 1-39% stenosis. Left Carotid: Velocities in the left ICA are consistent with a 1-39% stenosis. Vertebrals:  Bilateral vertebral arteries demonstrate antegrade flow. Subclavians: Normal flow hemodynamics were seen in bilateral  subclavian              arteries. *See table(s) above for measurements and observations.  Electronically signed by Jamelle Haring on 04/15/2021 at 5:25:32 PM.    Final     Assessment & Plan:    Walker Kehr, MD

## 2021-05-12 NOTE — Assessment & Plan Note (Signed)
On Remicade 

## 2021-05-13 ENCOUNTER — Other Ambulatory Visit: Payer: Self-pay | Admitting: Internal Medicine

## 2021-05-13 MED ORDER — REPAGLINIDE 1 MG PO TABS: 1.0000 mg | ORAL_TABLET | Freq: Three times a day (TID) | ORAL | 11 refills | Status: DC

## 2021-05-13 NOTE — Assessment & Plan Note (Signed)
Worse.  Hemoglobin A1c is 8.1%.  Will add Prandin 1 mg 3 times daily while on prednisone

## 2021-05-13 NOTE — Progress Notes (Unsigned)
Subjective:  Patient ID: Louis Kemp, male    DOB: 14-Dec-1941  Age: 80 y.o. MRN: [id number]  CC: No chief complaint on file.   HPI Louis Kemp presents for ***  Outpatient Medications Prior to Visit  Medication Sig Dispense Refill  . amLODipine (NORVASC) 2.5 MG tablet TAKE ONE TABLET BY MOUTH DAILY 90 tablet 3  . butalbital-acetaminophen-caffeine (FIORICET) 50-325-40 MG tablet Take 1-2 tablets by mouth every 6 (six) hours as needed for headache. 60 tablet 0  . carvedilol (COREG) 12.5 MG tablet TAKE ONE TABLET BY MOUTH TWICE A DAY WITH A MEAL 180 tablet 3  . cefdinir (OMNICEF) 300 MG capsule Take 1 capsule (300 mg total) by mouth 2 (two) times daily. 20 capsule 0  . Cholecalciferol (VITAMIN D) 2000 UNITS CAPS Take 1 capsule by mouth every other day.    . cyanocobalamin (,VITAMIN B-12,) 1000 MCG/ML injection Inject 1 mL (1,000 mcg total) into the muscle every 14 (fourteen) days. 30 mL 3  . escitalopram (LEXAPRO) 5 MG tablet Take 1 tablet (5 mg total) by mouth at bedtime. 30 tablet 5  . fenofibrate 160 MG tablet Take 1 tablet (160 mg total) by mouth daily. 90 tablet 3  . HYDROcodone-acetaminophen (NORCO) 7.5-325 MG tablet Take 1-2 tablets by mouth every 4 (four) hours as needed for moderate pain. 60 tablet 0  . metFORMIN (GLUCOPHAGE) 500 MG tablet TAKE 1 TABLET BY MOUTH 3 TIMES A DAY. 270 tablet 1  . omeprazole (PRILOSEC) 20 MG capsule Take 20 mg by mouth daily.    . predniSONE (DELTASONE) 5 MG tablet Take 2 tablets (10 mg total) by mouth daily with breakfast. 90 tablet 0  . sucralfate (CARAFATE) 1 g tablet Take 1 g by mouth 4 (four) times daily.    . tamsulosin (FLOMAX) 0.4 MG CAPS capsule Take 1 capsule (0.4 mg total) by mouth daily. 90 capsule 3   No facility-administered medications prior to visit.    ROS: Review of Systems  Objective:  There were no vitals taken for this visit.  BP Readings from Last 3 Encounters:  05/12/21 128/64  04/05/21 120/76  02/14/21 119/69     Wt Readings from Last 3 Encounters:  05/12/21 131 lb 9.6 oz (59.7 kg)  04/05/21 127 lb 9.6 oz (57.9 kg)  01/24/21 130 lb 12.8 oz (59.3 kg)    Physical Exam  Lab Results  Component Value Date   WBC 8.9 05/12/2021   HGB 11.9 (L) 05/12/2021   HCT 35.9 (L) 05/12/2021   PLT 289.0 05/12/2021   GLUCOSE 204 (H) 05/12/2021   CHOL 160 11/10/2020   TRIG 139.0 11/10/2020   HDL 38.20 (L) 11/10/2020   LDLCALC 94 11/10/2020   ALT 20 05/12/2021   AST 17 05/12/2021   NA 139 05/12/2021   K 4.4 05/12/2021   CL 104 05/12/2021   CREATININE 1.30 05/12/2021   BUN 17 05/12/2021   CO2 29 05/12/2021   TSH 1.04 11/10/2020   PSA 0.63 11/10/2020   INR 1.01 09/03/2014   HGBA1C 8.1 (H) 05/12/2021   MICROALBUR 1.8 11/10/2020    VAS US CAROTID  Result Date: 04/15/2021 Carotid Arterial Duplex Study Patient Name:  Louis Kemp  Date of Exam:   04/15/2021 Medical Rec #: [id number]       Accession #:    [id number] Date of Birth: 08-20-1941        Patient Gender: M Patient Age:   80Y Exam Location:  [facility] Procedure:  VAS US CAROTID Referring Phys: [place] --------------------------------------------------------------------------------  Indications:      Persistent headaches, visual disturbance. Risk Factors:     Hypertension, hyperlipidemia, Diabetes, past history of                   smoking. Comparison Study: 01-07-2021 Temporal artery ultrasound bilateral Performing Technologist: Darlin Coco RDMS,RVT  Examination Guidelines: A complete evaluation includes B-mode imaging, spectral Doppler, color Doppler, and power Doppler as needed of all accessible portions of each vessel. Bilateral testing is considered an integral part of a complete examination. Limited examinations for reoccurring indications may be performed as noted.  Right Carotid Findings: +----------+--------+--------+--------+------------------+--------+           PSV cm/sEDV cm/sStenosisPlaque  DescriptionComments +----------+--------+--------+--------+------------------+--------+ CCA Prox  107     26                                         +----------+--------+--------+--------+------------------+--------+ CCA Distal78      26              heterogenous               +----------+--------+--------+--------+------------------+--------+ ICA Prox  62      20      1-39%   calcific                   +----------+--------+--------+--------+------------------+--------+ ICA Distal74      34                                         +----------+--------+--------+--------+------------------+--------+ ECA       90      15                                         +----------+--------+--------+--------+------------------+--------+ +----------+--------+-------+----------------+-------------------+           PSV cm/sEDV cmsDescribe        Arm Pressure (mmHG) +----------+--------+-------+----------------+-------------------+ Subclavian150            Multiphasic, WNL                    +----------+--------+-------+----------------+-------------------+ +---------+--------+--+--------+--+---------+ VertebralPSV cm/s53EDV cm/s19Antegrade +---------+--------+--+--------+--+---------+  Left Carotid Findings: +----------+--------+--------+--------+------------------+--------+           PSV cm/sEDV cm/sStenosisPlaque DescriptionComments +----------+--------+--------+--------+------------------+--------+ CCA Prox  82      13                                         +----------+--------+--------+--------+------------------+--------+ CCA Distal68      12                                         +----------+--------+--------+--------+------------------+--------+ ICA Prox  47      14      1-39%   heterogenous               +----------+--------+--------+--------+------------------+--------+ ICA Distal83      34                                          +----------+--------+--------+--------+------------------+--------+  ECA       104     10                                         +----------+--------+--------+--------+------------------+--------+ +----------+--------+--------+----------------+-------------------+           PSV cm/sEDV cm/sDescribe        Arm Pressure (mmHG) +----------+--------+--------+----------------+-------------------+ [id number]             Multiphasic, WNL                    +----------+--------+--------+----------------+-------------------+ +---------+--------+--+--------+--+---------+ VertebralPSV cm/s56EDV cm/s15Antegrade +---------+--------+--+--------+--+---------+   Summary: Right Carotid: Velocities in the right ICA are consistent with a 1-39% stenosis. Left Carotid: Velocities in the left ICA are consistent with a 1-39% stenosis. Vertebrals:  Bilateral vertebral arteries demonstrate antegrade flow. Subclavians: Normal flow hemodynamics were seen in bilateral subclavian              arteries. *See table(s) above for measurements and observations.  Electronically signed by Jamelle Haring on 04/15/2021 at 5:25:32 PM.    Final     Assessment & Plan:   There are no diagnoses linked to this encounter.   No orders of the defined types were placed in this encounter.    Follow-up: No follow-ups on file.  Walker Kehr, MD

## 2021-05-19 ENCOUNTER — Encounter: Payer: Self-pay | Admitting: Neurology

## 2021-05-19 ENCOUNTER — Ambulatory Visit: Payer: Medicare Other | Admitting: Neurology

## 2021-05-19 ENCOUNTER — Telehealth: Payer: Self-pay | Admitting: *Deleted

## 2021-05-19 VITALS — BP 118/68 | HR 72 | Ht 66.0 in | Wt 129.0 lb

## 2021-05-19 DIAGNOSIS — L121 Cicatricial pemphigoid: Secondary | ICD-10-CM | POA: Diagnosis not present

## 2021-05-19 DIAGNOSIS — G44099 Other trigeminal autonomic cephalgias (TAC), not intractable: Secondary | ICD-10-CM

## 2021-05-19 MED ORDER — HYDROCODONE-ACETAMINOPHEN 7.5-325 MG PO TABS: 1.0000 | ORAL_TABLET | ORAL | 0 refills | Status: DC | PRN

## 2021-05-19 MED ORDER — INDOMETHACIN ER 75 MG PO CPCR: 75.0000 mg | ORAL_CAPSULE | Freq: Two times a day (BID) | ORAL | 3 refills | Status: DC

## 2021-05-19 NOTE — Progress Notes (Signed)
[facility]    Provider:  Dr Louis Kemp Requesting Provider: [name], Louis Lacks, MD Primary Care Provider:  [name], Louis Lacks, MD  CC:  Right temporal hedache  Interval history: The patient describes a headache primarily as a constant boring pain.  Certainly, the possibility of hemicrania continua does need to be considered or Cluster headache.  A trial indomethacin(or verapamil or Emgality) may be of some diagnostic benefit. He did not tolerate topamax but may have helped, neither tolerated zonegran, we have tried botox and will repeat, he declines depakote. Will titrate him off of prenisone and try Indomethacin. If no benefit will try Emgality or Verapamil.   Physially he is better, more energy, still having some redness of the right eye but maybe slightly improved, vision has not improved, Stopped the cellcept and on the rituximab.   Interval history: The pain is on the right temple, it happens in the afternoon or overnight about the same time every day, right eye waters(both eyes water), other wise headache wakes him up if he does not take medication, his blood pressure has been a little low he was feeling weak and it was 812 systolic. Taking the amlodipine at 6pm, taking the carvedilol 6-7a, 10pm. BP 108/77 and tachycardic. He took gatorade yesterday and he was better but this morning again his BP was a little low. We decided to do nerve blocks today. We discussed topamax, CGRP meds, unfrotunately difficult to assess and treat unclear etiology of headaches - associated with his pemphigoid eye disease or is this an independent headache syndrome with associated autonomic symptoms? If no improvement with rituximab this Thursday we will change course.   Interval history 01/24/2021: I spoke to patient's Louis Kemp physician, we are going to try and get infusion here in the office. He also continues on Prednisone. Temple pain/headache is 85% better but he still has pain supraorbitally.  Only at night or in the afternoon. But he is very improved. We discussed titrating the steroids and we will stay on 55m for one month then slowly decrease by 123mmonthly. We discussed checking hgba1c and his glucose, I recommended he check his glucose daily, he is feeling well, no problems, his eyes feel much better and are not as red or painful. We received some notes from Dr. PeSharen Counterbut no orders, I have contacted him to get specifics on the pre-treatment, the injection doses and any labwork he wants prior to infusions. He is taking omeprazole daily, taking steroids with food as well, no GI upset.   HPI:  [name] a [age].o. male here as requested by Louis Kemp, AlEvie LacksMD for headaches. PMHx ocular pemphigoid, B12 deficiency, HTN, PAC, DM2. New onset right temporal headache, continuous, sever at times.  Headaches started 2 weeks ago, it wasn't that bad but it is worsening and now can be severe, it is in the temple and radiates to the temple and frontal area. It becomes quite severe 7-8/10. Sharp pain, shooting pain, he had prior headache in the left eye which was not similar it was ocular pain his eye was so inflamed due to the Pemphigoid. This is not the same, this is new, and is not anything he has ever experienced. He has noticed in the last 4-5 days when he eats he has jaw pain. The right eye vision is worsening. No significant history of migraines. No other significant history of headaches, he had one migraine in the remote past and this is nothing like it. The  pain is continuous, no known triggers. Alleve is not working. Had a long discussion with patient regarding possiibilites and differential. No other focal neurologic deficits, associated symptoms, inciting events or modifiable factors.  Reviewed notes, labs and imaging from outside physicians, which showed:  Personally reviewed imaging with Louis Kemp(Neuroradiology) and agree with the following:mild T2 hyperintensities, mild  cerebral atrophy, otherwise unremarkable for age.   11/10/2020: TSH nml, B12 1056, cbc unremarkable, CMP elevated glucose and reduced GFR 52 otherwise normal Review of Systems: Patient complains of symptoms per HPI as well as the following symptoms: headache, vision changes . Pertinent negatives and positives per HPI. All others negative   Social History   Socioeconomic History  . Marital status: Married    Spouse name: Louis Kemp  . Number of children: Not on file  . Years of education: Not on file  . Highest education level: Not on file  Occupational History  . Occupation: MD Retired  Tobacco Use  . Smoking status: Former Smoker    Types: Cigars  . Smokeless tobacco: Never Used  . Tobacco comment: none in 8 years  Vaping Use  . Vaping Use: Never used  Substance and Sexual Activity  . Alcohol use: Yes    Comment: maybe 1 mixed drink every 3 weeks or so  . Drug use: No    Comment: 3-4 cigars   year  . Sexual activity: Not Currently  Other Topics Concern  . Not on file  Social History Narrative   Regular Exercise -  YES, golf   Lives at home with wife    Right handed   Caffeine:  1-2 cups/day   Social Determinants of Health   Financial Resource Strain: Not on file  Food Insecurity: Not on file  Transportation Needs: Not on file  Physical Activity: Not on file  Stress: Not on file  Social Connections: Not on file  Intimate Partner Violence: Not on file    Family History  Problem Relation Age of Onset  . Arthritis Mother   . Hypertension Father   . Coronary artery disease Father   . Hypertension Brother   . Hypertension Other   . Hypertension Brother   . Coronary artery disease Other     Past Medical History:  Diagnosis Date  . BPH (benign prostatic hypertrophy)   . Hyperlipidemia   . Hypertension   . LBP (low back pain)   . MVP (mitral valve prolapse)   . 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 Osteoarthritis   . PAC (premature atrial  contraction)   . Statin intolerance   . Type II or unspecified type diabetes mellitus without mention of complication, not stated as uncontrolled 2011  . Vitamin B 12 deficiency 2011  . Vitamin D deficiency   . Vitiligo     Patient Active Problem List   Diagnosis Date Noted  . Ocular pemphigoid 05/19/2021  . Chronic sinusitis 05/12/2021  . Depression 05/12/2021  . 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 Right sided temporal headache 01/06/2021  . Chronic renal insufficiency, stage 3 (moderate) ([place]) 05/11/2020  . Pemphigoid 05/10/2020  . Elevated serum creatinine 07/11/2018  . Hip pain, acute, right 01/10/2018  . Postoperative anemia due to acute blood loss 09/30/2014  . Status post left partial knee replacement 09/15/2014  . S/P left UKR 09/14/2014  . S/P right TKA 06/15/2014  . Osteoarthritis, knee 11/24/2013  . Weight loss 06/24/2013  . PAC (premature atrial contraction) 01/13/2013  . Heart  murmur 01/13/2013  . Well adult exam 11/03/2012  . Paresthesia 10/22/2012  . Hypertension 04/18/2012  . Diabetes type 2, controlled ([place]) 10/20/2010  . B12 deficiency 10/20/2010  . Vitamin D deficiency 10/20/2010  . Dyslipidemia 07/14/2010  . BENIGN PROSTATIC HYPERTROPHY 07/14/2010  . OSTEOARTHRITIS 07/14/2010  . LOW BACK PAIN 07/14/2010  . HYPERGLYCEMIA 07/14/2010    Past Surgical History:  Procedure Laterality Date  . arm surgery Left    ORIF left  forearm  . CATARACT EXTRACTION  2012  . EYE SURGERY    . HERNIA REPAIR Bilateral 1971  . PARTIAL KNEE ARTHROPLASTY Left 09/14/2014   Procedure: LEFT UNICOMPARTMENTAL KNEE ARTHROPLASTY MEDIALLY;  Surgeon: Mauri Pole, MD;  Location: [facility];  Service: Orthopedics;  Laterality: Left;  . TOTAL KNEE ARTHROPLASTY Right 06/15/2014   Procedure: RIGHT TOTAL KNEE ARTHROPLASTY;  Surgeon: Mauri Pole, MD;  Location: [facility];  Service: Orthopedics;  Laterality: Right;    Current Outpatient Medications  Medication Sig Dispense  Refill  . amLODipine (NORVASC) 2.5 MG tablet TAKE ONE TABLET BY MOUTH DAILY 90 tablet 3  . butalbital-acetaminophen-caffeine (FIORICET) 50-325-40 MG tablet Take 1-2 tablets by mouth every 6 (six) hours as needed for headache. 60 tablet 0  . carvedilol (COREG) 12.5 MG tablet TAKE ONE TABLET BY MOUTH TWICE A DAY WITH A MEAL 180 tablet 3  . cefdinir (OMNICEF) 300 MG capsule Take 1 capsule (300 mg total) by mouth 2 (two) times daily. 20 capsule 0  . Cholecalciferol (VITAMIN D) 2000 UNITS CAPS Take 1 capsule by mouth every other day.    . cyanocobalamin (,VITAMIN B-12,) 1000 MCG/ML injection Inject 1 mL (1,000 mcg total) into the muscle every 14 (fourteen) days. 30 mL 3  . escitalopram (LEXAPRO) 5 MG tablet Take 1 tablet (5 mg total) by mouth at bedtime. 30 tablet 5  . fenofibrate 160 MG tablet Take 1 tablet (160 mg total) by mouth daily. 90 tablet 3  . indomethacin (INDOCIN SR) 75 MG CR capsule Take 1 capsule (75 mg total) by mouth 2 (two) times daily with a meal. 60 capsule 3  . metFORMIN (GLUCOPHAGE) 500 MG tablet TAKE 1 TABLET BY MOUTH 3 TIMES A DAY. 270 tablet 1  . predniSONE (DELTASONE) 5 MG tablet Take 2 tablets (10 mg total) by mouth daily with breakfast. 90 tablet 0  . sucralfate (CARAFATE) 1 g tablet Take 1 g by mouth 4 (four) times daily.    . tamsulosin (FLOMAX) 0.4 MG CAPS capsule Take 1 capsule (0.4 mg total) by mouth daily. 90 capsule 3  . HYDROcodone-acetaminophen (NORCO) 7.5-325 MG tablet Take 1-2 tablets by mouth every 4 (four) hours as needed for moderate pain. 60 tablet 0  . repaglinide (PRANDIN) 1 MG tablet Take 1 tablet (1 mg total) by mouth 3 (three) times daily before meals. (Patient not taking: Reported on 05/19/2021) 90 tablet 11   No current facility-administered medications for this visit.    Allergies as of 05/19/2021 - Review Complete 05/19/2021  Allergen Reaction Noted  . Atorvastatin  10/20/2010  . Rosuvastatin  10/20/2010  . Zonisamide  05/12/2021    Vitals: BP  118/68 (BP Location: Right Arm, Patient Position: Sitting)   Pulse 72   Ht 5' 6"  (1.676 m)   Wt 129 lb (58.5 kg)   BMI 20.82 kg/m  Last Weight:  Wt Readings from Last 1 Encounters:  05/19/21 129 lb (58.5 kg)   Last Height:   Ht Readings from Last 1 Encounters:  05/19/21 5'  6" (1.676 m)     Physical exam: stable Exam: Gen: Improved today does not appear in as much pain, +lacrimation, photophobia, conversant, well nourised,  well groomed                     CV: RRR, no MRG. No Carotid Bruits. No peripheral edema, warm, nontender Eyes: Conjunctivae injection with lacrimation right eye  Neuro: stable Detailed Neurologic Exam  Speech:    Speech is normal; fluent and spontaneous with normal comprehension.  Cognition:    The patient is oriented to person, place, and time;     recent and remote memory intact;     language fluent;     normal attention, concentration,     fund of knowledge Cranial Nerves:    The pupils are equal, round, and reactive to light. Could not visualize fundi due to photophobia. Visual fields are full to threat. Extraocular movements are intact. Trigeminal sensation is intact and the muscles of mastication are normal. The face is symmetric. The palate elevates in the midline. Hearing intact. Voice is normal. Shoulder shrug is normal. The tongue has normal motion without fasciculations.   Coordination:    No dysmetria or ataxia  Gait:    Normal native gait  Motor Observation:    No asymmetry, no atrophy, and no involuntary movements noted. Tone:    Normal muscle tone.    Posture:    Posture is normal. normal erect    Strength:    Strength is V/V in the upper and lower limbs.      Sensation: intact to LT     Reflex Exam:  DTR's:    Deep tendon reflexes in the upper and lower extremities are symmetrical bilaterally.   Toes:    The toes are equiv bilaterally.   Clonus:    Clonus is absent.    Assessment/Plan:   80 y.o. male here as  requested by Louis Kemp, Louis Lacks, MD for headaches. PMHx ocular pemphigoid, B12 deficiency, HTN, PAC, DM2. New onset right temporal headache in the setting of pemphigoid Autoimmune eye disease), continuous, severe at times, no signifcant improvement with steroids. Unfortunately difficult to assess and treat unclear etiology of headaches - associated with his pemphigoid eye disease or is this an independent headache syndrome with associated autonomic symptoms? MRI of the of the brain and carotid ultrasound were completed without etiology.   The patient describes a headache primarily as a constant boring pain.  Certainly, the possibility of hemicrania continua does need to be considered or Cluster headache.  A trial indomethacin(or verapamil or Emgality) may be of some diagnostic benefit. He did not tolerate topamax but may have helped, neither tolerated zonegran, we have tried botox and will repeat, he declines depakote, nurtec, sumatriptan, nerve blocks were only temporary and of no great relief. Will titrate him off of prenisone and try Indomethacin. If no benefit will try Emgality or Verapamil.   - Decrease steroids to 93m daily x 1 week then 555mqod then stop - Start high dose of indomethacin 7542mid after stopping steroids for a trail to see if this is an indomethacin-responsive headache - Can try Emgality next if indomethacin is not effective - Repeat botox 2-3 weeks    Continue: Rituximab and [facility] to [facility]. For ocular pemphigoid  I spent over 45 minutes of face-to-face and non-face-to-face time with patient on the  1. Trigeminal autonomic cephalgias   2. Ocular pemphigoid    diagnosis.  This included previsit chart review, lab review, study review, order entry, electronic health record documentation, patient education on the different diagnostic and therapeutic options, counseling and coordination of care, risks and benefits of management, compliance, or risk  factor reduction  PRIOR Assessment and plan:   I spoke to patient's [place] physician, we are going to try and get infusion here in the office. He also continues on Prednisone 47m. Temple pain/headache is 85% better but he still has pain supraorbitally however only at night or in the afternoon. But he is very improved. We discussed titrating the steroids and we will stay on 666mfor one month then slowly decrease by 1071monthly. We discussed checking hgba1c and his glucose, I recommended he check his glucose daily, he is feeling well, no problems, his eyes feel much better and are not as red or painful. We received some notes from Dr. PerSharen Counterut no orders, I have contacted him to get specifics on Rituximab including the pre-treatment, the injection doses and any labwork he wants prior to infusions. He is taking omeprazole daily, taking steroids with food as well, no GI upset.  Unclear etiology of headache but it has to be inflammatory/vascular or somehow related to the ocular pemphigoid, I doubt he would develop migraines or cluster headache at the age of 79 13t trigeminal autonomic cephalalgias in the differential. Still we spoke about trying Depakote or other migraine medication(I do not think this is a migraine but it still may benefit from these medications). We decided to stay with the prednisone as it is helping after only 2 weeks and can consider other medications if needed (he is not enthusiastic about topamax or gabapentin, nurtec helped a little, wonder if qulipta would be helpful or depakote)  PRIOR Patient is on cellcept so I would think Temporal Arteritis(GCA) less likely however he has a new right temporal headache (he has no significant headache history) over the last 2 weeks with jaw symptoms and worsening vision. I am going to treat him for GCA and order esr/crp however not sure they will be reliable. Will order Ultra Sound of the temporal arteries, if esr/crp elevated we can discuss a  biopsy but may be elevated due to his current inflammatory eye disease. I have contacted his ocular immunologist Dr. PerSharen Counter (Addendum: ESR 31(ULN 30), CRP 13(ULN 10), temporal artery ultrasound without evidence of halo sign)  VicSeveriano GilbertD ocular immunologist [phone] orders of the defined types were placed in this encounter.  Meds ordered this encounter  Medications  . indomethacin (INDOCIN SR) 75 MG CR capsule    Sig: Take 1 capsule (75 mg total) by mouth 2 (two) times daily with a meal.    Dispense:  60 capsule    Refill:  3  . HYDROcodone-acetaminophen (NORCO) 7.5-325 MG tablet    Sig: Take 1-2 tablets by mouth every 4 (four) hours as needed for moderate pain.    Dispense:  60 tablet    Refill:  0    Can call Dr. AheJaynee Kemp cell phone if any issues [phone] Cc: Louis Kemp, AleEvie LacksD,  VicSeveriano GilbertD ocular immunologist [phone] IllD  [facility] [id number] StreetiCulvereNorth CreekC [id number] [phone] spent 30 minutes of face-to-face and non-face-to-face time with patient on the  1. Trigeminal autonomic cephalgias   2. Ocular pemphigoid    diagnosis.  This included previsit chart  review, lab review, study review, order entry, electronic health record documentation, patient education on the different diagnostic and therapeutic options, counseling and coordination of care, risks and benefits of management, compliance, or risk factor reduction. This does not include time spent on nerve block.

## 2021-05-19 NOTE — Patient Instructions (Addendum)
Liram Tonuzi in [employer]  - Decrease steroids to 5mg  daily x 1 week then 5mg  qod then stop - Start high dose of indomethacin 75mg  bid after stopping steroids - Can try Emgality next if indomethacin is not effective - Repeat botox 2-3 weeks   Galcanezumab injection What is this medicine? GALCANEZUMAB (gal ka NEZ ue mab) is used to prevent migraines and treat cluster headaches. This medicine may be used for other purposes; ask your health care provider or pharmacist if you have questions. COMMON BRAND NAME(S): Emgality What should I tell my health care provider before I take this medicine? They need to know if you have any of these conditions:  an unusual or allergic reaction to galcanezumab, other medicines, foods, dyes, or preservatives  pregnant or trying to get pregnant  breast-feeding How should I use this medicine? This medicine is for injection under the skin. You will be taught how to prepare and give this medicine. Use exactly as directed. Take your medicine at regular intervals. Do not take your medicine more often than directed. It is important that you put your used needles and syringes in a special sharps container. Do not put them in a trash can. If you do not have a sharps container, call your pharmacist or healthcare provider to get one. Talk to your pediatrician regarding the use of this medicine in children. Special care may be needed. Overdosage: If you think you have taken too much of this medicine contact a poison control center or emergency room at once. NOTE: This medicine is only for you. Do not share this medicine with others. What if I miss a dose? If you miss a dose, take it as soon as you can. If it is almost time for your next dose, take only that dose. Do not take double or extra doses. What may interact with this medicine? Interactions are not expected. This list may not describe all possible interactions. Give your health care provider a list of all the  medicines, herbs, non-prescription drugs, or dietary supplements you use. Also tell them if you smoke, drink alcohol, or use illegal drugs. Some items may interact with your medicine. What should I watch for while using this medicine? Tell your doctor or healthcare professional if your symptoms do not start to get better or if they get worse. What side effects may I notice from receiving this medicine? Side effects that you should report to your doctor or health care professional as soon as possible:  allergic reactions like skin rash, itching or hives, swelling of the face, lips, or tongue Side effects that usually do not require medical attention (report these to your doctor or health care professional if they continue or are bothersome):  pain, redness, or irritation at site where injected This list may not describe all possible side effects. Call your doctor for medical advice about side effects. You may report side effects to FDA at 1-800-FDA-1088. Where should I keep my medicine? Keep out of the reach of children. You will be instructed on how to store this medicine. Throw away any unused medicine after the expiration date on the label. NOTE: This sheet is a summary. It may not cover all possible information. If you have questions about this medicine, talk to your doctor, pharmacist, or health care provider.  2021 Elsevier/Gold Standard (2018-05-29 12:03:23) Indomethacin Oral Capsules What is this medicine? INDOMETHACIN (in doe METH a sin) is a non-steroidal anti-inflammatory drug, also known as an NSAID. It treats pain,  inflammation, and swelling. This medicine may be used for other purposes; ask your health care provider or pharmacist if you have questions. COMMON BRAND NAME(S): Indocin, TIVORBEX What should I tell my health care provider before I take this medicine? They need to know if you have any of these conditions:  bleeding disorder  coronary artery bypass graft (CABG)  within the past 2 weeks  dehydration  depression  heart attack  heart disease  heart failure  high blood pressure  if you often drink alcohol  kidney disease  liver disease  lung or breathing disease (asthma)  Parkinson's disease  receiving steroids like dexamethasone or prednisone  seizures  smoke tobacco cigarettes  stomach bleeding  stomach ulcers, other stomach or intestine problems  take drugs that treat or prevent blood clots  an unusual or allergic reaction to indomethacin, other medicines, foods, dyes, or preservatives  pregnant or trying to get pregnant  breast-feeding How should I use this medicine? Take this drug by mouth. Take it as directed on the prescription label at the same time every day. You can take it with or without food. If it upsets your stomach, take it with food. Keep taking it unless your health care provider tells you to stop. A special MedGuide will be given to you by the pharmacist with each prescription and refill. Be sure to read this information carefully each time. Talk to your health care provider about the use of this drug in children. While it may be prescribed for children as young as 58 for selected conditions, precautions do apply. Patients over 63 years of age may have a stronger reaction and need a smaller dose. Overdosage: If you think you have taken too much of this medicine contact a poison control center or emergency room at once. NOTE: This medicine is only for you. Do not share this medicine with others. What if I miss a dose? If you miss a dose, take it as soon as you can. If it is almost time for your next dose, take only that dose. Do not take double or extra doses. What may interact with this medicine? Do not take this medicine with any of the following medications:  cidofovir  diflunisal  ketorolac  methotrexate  pemetrexed  triamterene This medicine may also interact with the following  medications:  alcohol  antacids  aspirin and aspirin-like medicines  cyclosporine  digoxin  diuretics  lithium  medicines for diabetes  medicines for high blood pressure  medicines that affect platelets  medicines that treat or prevent blood clots like warfarin  NSAIDs, medicines for pain and inflammation, like ibuprofen or naproxen  probenecid  steroid medicines like prednisone or cortisone This list may not describe all possible interactions. Give your health care provider a list of all the medicines, herbs, non-prescription drugs, or dietary supplements you use. Also tell them if you smoke, drink alcohol, or use illegal drugs. Some items may interact with your medicine. What should I watch for while using this medicine? Visit your health care provider for regular checks on your progress. Tell your health care provider if your symptoms do not start to get better or if they get worse. Do not take other medicines that contain aspirin, ibuprofen, or naproxen with this medicine. Side effects such as stomach upset, nausea, or ulcers may be more likely to occur. Many non-prescription medicines contain aspirin, ibuprofen, or naproxen. Always read labels carefully. This medicine can cause serious ulcers and bleeding in the stomach. It can  happen with no warning. Smoking, drinking alcohol, older age, and poor health can also increase risks. Call your health care provider right away if you have stomach pain or blood in your vomit or stool. This medicine does not prevent a heart attack or stroke. This medicine may increase the chance of a heart attack or stroke. The chance may increase the longer you use this medicine or if you have heart disease. If you take aspirin to prevent a heart attack or stroke, talk to your health care provider about using this medicine. Alcohol may interfere with the effect of this medicine. Avoid alcoholic drinks. This medicine may cause serious skin reactions.  They can happen weeks to months after starting the medicine. Contact your health care provider right away if you notice fevers or flu-like symptoms with a rash. The rash may be red or purple and then turn into blisters or peeling of the skin. Or, you might notice a red rash with swelling of the face, lips or lymph nodes in your neck or under your arms. Talk to your health care provider if you are pregnant before taking this medicine. Taking this medicine between weeks 20 and 30 of pregnancy may harm your unborn baby. Your health care provider will monitor you closely if you need to take it. After 30 weeks of pregnancy, do not take this medicine. You may get drowsy or dizzy. Do not drive, use machinery, or do anything that needs mental alertness until you know how this medicine affects you. Do not stand up or sit up quickly, especially if you are an older patient. This reduces the risk of dizzy or fainting spells. Be careful brushing or flossing your teeth or using a toothpick because you may get an infection or bleed more easily. If you have any dental work done, tell your dentist you are receiving this medicine. This medicine may make it more difficult to get pregnant. Talk to your health care provider if you are concerned about your fertility. What side effects may I notice from receiving this medicine? Side effects that you should report to your doctor or health care provider as soon as possible:  allergic reactions (skin rash, itching or hives; swelling of the face, lips, or tongue)  blurred vision OR changes in vision  heart attack (trouble breathing; pain or tightness in the chest, neck, back or arms; unusually weak or tired)  heart failure (trouble breathing; fast, irregular heartbeat; sudden weight gain; swelling of the ankles, feet, hands; unusually weak or tired)  high potassium levels (chest pain; fast, irregular heartbeat; muscle weakness)  increase in blood pressure  kidney injury  (trouble passing urine or change in the amount of urine)  liver injury (dark yellow or brown urine; general ill feeling or flu-like symptoms; loss of appetite, right upper belly pain; unusually weak or tired, yellowing of the eyes or skin)  low red blood cell counts (trouble breathing; feeling faint; lightheaded, falls; unusually weak or tired)  rash, fever, and swollen lymph nodes  redness, blistering, peeling, or loosening of the skin, including inside the mouth  seizures  stomach bleeding (bloody or black, tarry stools; spitting up blood or brown material that looks like coffee grounds)  stroke (changes in vision; confusion; trouble speaking or understanding; severe headaches; sudden numbness or weakness of the face, arm or leg; trouble walking; dizziness; loss of balance or coordination) Side effects that usually do not require medical attention (report to your doctor or health care provider if they continue  or are bothersome):  depressed mood  diarrhea  excessive sweating  facial flushing (redness)  headache  upset stomach This list may not describe all possible side effects. Call your doctor for medical advice about side effects. You may report side effects to FDA at 1-800-FDA-1088. Where should I keep my medicine? Keep out of the reach of children and pets. Store at room temperature between 15 and 30 degrees C (59 and 86 degrees F). Protect from light and moisture. Keep the container tightly closed. Get rid of any unused medicine after the expiration date. To get rid of medicines that are no longer needed or have expired:  Take the medicine to a medicine take-back program. Check with your pharmacy or law enforcement to find a location.  If you cannot return the medicine, check the label or package insert to see if the medicine should be thrown out in the garbage or flushed down the toilet. If you are not sure, ask your health care provider. If it is safe to put it in the  trash, empty the medicine out of the container. Mix the medicine with cat litter, dirt, coffee grounds, or other unwanted substance. Seal the mixture in a bag or container. Put it in the trash. NOTE: This sheet is a summary. It may not cover all possible information. If you have questions about this medicine, talk to your doctor, pharmacist, or health care provider.  2021 Elsevier/Gold Standard (2020-05-07 12:39:08)

## 2021-05-19 NOTE — Telephone Encounter (Signed)
Completed Indomethacin ER PA on Cover My Meds. Key: [id number]. Awaiting determination from [employer].

## 2021-05-24 NOTE — Telephone Encounter (Signed)
Request Reference Number: [id number]. INDOMETHACIN CAP 75MG  ER is denied for not meeting the prior authorization requirement(s). Details of this decision are in the notice attached below or have been faxed to you. Appeals are not supported through [place]. Please refer to the fax case notice for appeals information and instructions.  "Medicare allows [place] to cover a drug only when it is a Part D drug. A Part D drug is one that is used for a "medically accepted indication." A medically accepted indication means the use is approved by the Food and Drug Administration (FDA) OR the use is supported by one of the following accepted references: (1) [place]. (2) Micromedex [place]. Indomethacin ER is not FDA approved for your medical condition(s): Other trigeminal autonomic cephalgias, not intractable. These condition(s) are not supported by one of the accepted references. Therefore your drug is denied because it is not being used for a "medically accepted indication." Reviewed by: Virgel Gess, RPh"  If desired, fax for expedited appeal: Fax: [phone]  Patient could use goodrx for this. Price $22+

## 2021-05-25 MED ORDER — INDOMETHACIN ER 75 MG PO CPCR: 75.0000 mg | ORAL_CAPSULE | Freq: Two times a day (BID) | ORAL | 3 refills | Status: DC

## 2021-05-25 NOTE — Telephone Encounter (Signed)
I spoke with the patient about the option to pay cash and use GoodRx coupon due to insurance not paying for the medication. The patient asked for a printed Indomethacin prescription that he can pick up and take to a local pharmacy that accepts GoodRx.  This has been printed and signed by Dr. Jaynee Eagles.  Prescription placed up-front for patient pickup along with goodrx coupons.

## 2021-05-25 NOTE — Addendum Note (Signed)
Addended by: Gildardo Griffes on: 05/25/2021 10:03 AM   Modules accepted: Orders

## 2021-05-31 DIAGNOSIS — H16143 Punctate keratitis, bilateral: Secondary | ICD-10-CM | POA: Diagnosis not present

## 2021-05-31 DIAGNOSIS — H16103 Unspecified superficial keratitis, bilateral: Secondary | ICD-10-CM | POA: Diagnosis not present

## 2021-05-31 DIAGNOSIS — H168 Other keratitis: Secondary | ICD-10-CM | POA: Diagnosis not present

## 2021-06-05 ENCOUNTER — Other Ambulatory Visit: Payer: Self-pay | Admitting: Internal Medicine

## 2021-06-07 DIAGNOSIS — H02051 Trichiasis without entropian right upper eyelid: Secondary | ICD-10-CM | POA: Diagnosis not present

## 2021-06-07 DIAGNOSIS — H02054 Trichiasis without entropian left upper eyelid: Secondary | ICD-10-CM | POA: Diagnosis not present

## 2021-06-07 DIAGNOSIS — H168 Other keratitis: Secondary | ICD-10-CM | POA: Diagnosis not present

## 2021-06-07 DIAGNOSIS — H16143 Punctate keratitis, bilateral: Secondary | ICD-10-CM | POA: Diagnosis not present

## 2021-06-07 DIAGNOSIS — H16103 Unspecified superficial keratitis, bilateral: Secondary | ICD-10-CM | POA: Diagnosis not present

## 2021-06-09 ENCOUNTER — Ambulatory Visit: Payer: Medicare Other | Admitting: Neurology

## 2021-06-09 DIAGNOSIS — G44019 Episodic cluster headache, not intractable: Secondary | ICD-10-CM | POA: Diagnosis not present

## 2021-06-09 NOTE — Progress Notes (Signed)
Consent Form Botulism Toxin Injection For Chronic Migraine   06/09/2021: Indomethacin not helping with headache, which essentially make indomethacin-responsive headache like hemicrania unlikely. Stop steroids. Stop Indomethacin.We will treat for cluster headaches, can transition him to verapamil, today we performen botox again (see procedure below), and next we can try Emgality.   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 He subsequently developed severe periorbital pain on the right with autonomic symptoms. High dose steroids was trialed (60mg ) for over a month and he is currently on 20mg . He was started on Topamax and also trialed botox. Very unclear if this is some sort of secondary headache disorder to the ocular pemphigoid or if he has concomitant trigeminal autonomic cephalalgia, but unclear which one he fits into(cluster, vs SUNA/SUNCT, Hemicrania etc). The trigeminal autonomic cephalalgias are rare headache disorders as is ocular pemphigoid, to have both develop in a short period of time would be highly unlikely. Today trialing botox and starting topiramate. Nurtec did not help. May consider trialing Indomethacin if he will discontinue steroids, would be difficult to trial verapamil due to hypotension (per patient), may consider Emgality if ok with his [facility] ophthalmologist.  Reviewed orally with patient, additionally signature is on file:  Botulism toxin has been approved by the Federal drug administration for treatment of chronic migraine. Botulism toxin does not cure chronic migraine and it may not be effective in some patients.  The administration of botulism toxin is accomplished by injecting a small amount of toxin into the muscles of the neck and head. Dosage must be titrated for each individual. Any benefits resulting from botulism toxin tend to wear off after 3 months with a  repeat injection required if benefit is to be maintained. Injections are usually done every 3-4 months with maximum effect peak achieved by about 2 or 3 weeks. Botulism toxin is expensive and you should be sure of what costs you will incur resulting from the injection.  The side effects of botulism toxin use for chronic migraine may include:   -Transient, and usually mild, facial weakness with facial injections  -Transient, and usually mild, head or neck weakness with head/neck injections  -Reduction or loss of forehead facial animation due to forehead muscle weakness  -Eyelid drooping  -Dry eye  -Pain at the site of injection or bruising at the site of injection  -Double vision  -Potential unknown long term risks  Contraindications: You should not have Botox if you are pregnant, nursing, allergic to albumin, have an infection, skin condition, or muscle weakness at the site of the injection, or have myasthenia gravis, Lambert-Eaton syndrome, or ALS.  It is also possible that as with any injection, there may be an allergic reaction or no effect from the medication. Reduced effectiveness after repeated injections is sometimes seen and rarely infection at the injection site may occur. All care will be taken to prevent these side effects. If therapy is given over a long time, atrophy and wasting in the muscle injected may occur. Occasionally the patient's become refractory to treatment because they develop antibodies to the toxin. In this event, therapy needs to be modified.  I have read the above information and consent to the administration of botulism toxin.    BOTOX PROCEDURE NOTE FOR MIGRAINE HEADACHE    Contraindications and precautions discussed with patient(above). Aseptic procedure was observed and patient tolerated procedure. Procedure performed by Dr. Georgia Dom  The condition has existed for more than 6 months, and pt does not have a diagnosis of ALS, Myasthenia Gravis or  Lambert-Eaton Syndrome.  Risks and benefits of injections discussed and pt agrees to proceed with the procedure.  Written consent obtained  These injections are medically necessary. Pt  receives good benefits from these injections. These injections do not cause sedations or hallucinations which the oral therapies may cause.  Description of procedure:  The patient was placed in a sitting position. The standard protocol was used for Botox as follows, with 5 units of Botox injected at each site:   -Procerus muscle, midline injection  -Corrugator muscle, bilateral injection  -Frontalis muscle, bilateral injection, with 2 sites each side, medial injection was performed in the upper one third of the frontalis muscle, in the region vertical from the medial inferior edge of the superior orbital rim. The lateral injection was again in the upper one third of the forehead vertically above the lateral limbus of the cornea, 1.5 cm lateral to the medial injection site.  -Temporalis muscle injection, 4 sites, bilaterally. The first injection was 3 cm above the tragus of the ear, second injection site was 1.5 cm to 3 cm up from the first injection site in line with the tragus of the ear. The third injection site was 1.5-3 cm forward between the first 2 injection sites. The fourth injection site was 1.5 cm posterior to the second injection site.   -Occipitalis muscle injection, 3 sites, bilaterally. The first injection was done one half way between the occipital protuberance and the tip of the mastoid process behind the ear. The second injection site was done lateral and superior to the first, 1 fingerbreadth from the first injection. The third injection site was 1 fingerbreadth superiorly and medially from the first injection site.  -Cervical paraspinal muscle injection, 2 sites, bilateral knee first injection site was 1 cm from the midline of the cervical spine, 3 cm inferior to the lower border of the occipital  protuberance. The second injection site was 1.5 cm superiorly and laterally to the first injection site.  -Trapezius muscle injection was performed at 3 sites, bilaterally. The first injection site was in the upper trapezius muscle halfway between the inflection point of the neck, and the acromion. The second injection site was one half way between the acromion and the first injection site. The third injection was done between the first injection site and the inflection point of the neck.   Will return for repeat injection in 3 months.   200 units of Botox was used, any Botox not injected was wasted. The patient tolerated the procedure well, there were no complications of the above procedure.  I spent over 40  minutes of face-to-face and non-face-to-face time with patient on the  1. Episodic cluster headache, not intractable    diagnosis.  This included previsit chart review, lab review, study review, order entry, electronic health record documentation, patient education on the different diagnostic and therapeutic options, counseling and coordination of care, risks and benefits of management, compliance, or risk factor reduction

## 2021-06-09 NOTE — Patient Instructions (Signed)
Will change to verapamil - will call pcp In a few weeks if no improvement layer on EMGALITY (Discuss with Dr. Sharen Counter)  Marijean Heath injection What is this medication? GALCANEZUMAB (gal ka NEZ ue mab) is used to prevent migraines and treat clusterheadaches. This medicine may be used for other purposes; ask your health care provider orpharmacist if you have questions. COMMON BRAND NAME(S): Emgality What should I tell my care team before I take this medication? They need to know if you have any of these conditions: an unusual or allergic reaction to galcanezumab, other medicines, foods, dyes, or preservatives pregnant or trying to get pregnant breast-feeding How should I use this medication? This medicine is for injection under the skin. You will be taught how to prepare and give this medicine. Use exactly as directed. Take your medicine atregular intervals. Do not take your medicine more often than directed. It is important that you put your used needles and syringes in a special sharps container. Do not put them in a trash can. If you do not have a sharpscontainer, call your pharmacist or healthcare provider to get one. Talk to your pediatrician regarding the use of this medicine in children.Special care may be needed. Overdosage: If you think you have taken too much of this medicine contact apoison control center or emergency room at once. NOTE: This medicine is only for you. Do not share this medicine with others. What if I miss a dose? If you miss a dose, take it as soon as you can. If it is almost time for yournext dose, take only that dose. Do not take double or extra doses. What may interact with this medication? Interactions are not expected. This list may not describe all possible interactions. Give your health care provider a list of all the medicines, herbs, non-prescription drugs, or dietary supplements you use. Also tell them if you smoke, drink alcohol, or use illegaldrugs. Some  items may interact with your medicine. What should I watch for while using this medication? Tell your doctor or healthcare professional if your symptoms do not start toget better or if they get worse. What side effects may I notice from receiving this medication? Side effects that you should report to your doctor or health care professionalas soon as possible: allergic reactions like skin rash, itching or hives, swelling of the face, lips, or tongue Side effects that usually do not require medical attention (report these toyour doctor or health care professional if they continue or are bothersome): pain, redness, or irritation at site where injected This list may not describe all possible side effects. Call your doctor for medical advice about side effects. You may report side effects to FDA at1-800-FDA-1088. Where should I keep my medication? Keep out of the reach of children. You will be instructed on how to store this medicine. Throw away any unusedmedicine after the expiration date on the label. NOTE: This sheet is a summary. It may not cover all possible information. If you have questions about this medicine, talk to your doctor, pharmacist, orhealth care provider.  2022 Elsevier/Gold Standard (2018-05-29 12:03:23) Verapamil Sustained-Release Capsules What is this medication? VERAPAMIL (ver AP a mil) treats high blood pressure and prevents chest pain (angina). It works by relaxing the blood vessels, which helps decrease the amount of work your heart has to do. It belongs to a group of medicationscalled calcium channel blockers. This medicine may be used for other purposes; ask your health care provider orpharmacist if you have questions. COMMON BRAND NAME(S):  Verelan, Verelan PM What should I tell my care team before I take this medication? They need to know if you have any of these conditions: Duchenne muscular dystrophy Heart disease Irregular heartbeat or rhythm Liver disease Low  blood pressure An unusual or allergic reaction to verapamil, other medications, foods, dyes or preservatives Pregnant or trying to get pregnant Breast-feeding How should I use this medication? Take this medication by mouth with water. Take it as directed on the prescription label at the same time every day. Do not cut, crush or chew this medication. Swallow the capsules whole. You may open the capsule and put the contents in 1 teaspoon of applesauce. Swallow the medication and applesauce right away. Do not chew the medication or applesauce. Keep taking it unlessyour care team tells you to stop. Do not take this medication with grapefruit juice. Talk to your care team about the use of this medication in children. Specialcare may be needed. Overdosage: If you think you have taken too much of this medicine contact apoison control center or emergency room at once. NOTE: This medicine is only for you. Do not share this medicine with others. What if I miss a dose? If you miss a dose, take it as soon as you can. If it is almost time for yournext dose, take only that dose. Do not take double or extra doses. What may interact with this medication? Do not take this medication with any of the following: Cisapride Disopyramide Dofetilide Grapefruit juice Hawthorn Pimozide Red yeast rice This medication may also interact with the following: Barbiturates such as phenobarbital Cimetidine Cyclosporine Lithium Local anesthetics or general anesthetics Medications for heart rhythm problems like amiodarone, digoxin, flecainide, procainamide, quinidine Medications for high blood pressure or heart problems Medications for seizures like carbamazepine and phenytoin Rifampin, rifabutin or rifapentine Theophylline or aminophylline This list may not describe all possible interactions. Give your health care provider a list of all the medicines, herbs, non-prescription drugs, or dietary supplements you use. Also  tell them if you smoke, drink alcohol, or use illegaldrugs. Some items may interact with your medicine. What should I watch for while using this medication? Visit your care team for regular checks on your progress. Check your blood pressure as directed. Ask your care team what your blood pressure should be.Also, find out when you should contact them. Do not treat yourself for coughs, colds, or pain while you are using this medication without asking your care team for advice. Some medications mayincrease your blood pressure. You may get drowsy or dizzy. Do not drive, use machinery, or do anything that needs mental alertness until you know how this medication affects you. Do not stand up or sit up quickly, especially if you are an older patient. Thisreduces the risk of dizzy or fainting spells. What side effects may I notice from receiving this medication? Side effects that you should report to your care team as soon as possible: Allergic reactions-skin rash, itching, hives, swelling of the face, lips, tongue, or throat Heart failure-shortness of breath, swelling of the ankles, feet, or hands, sudden weight gain, unusual weakness or fatigue Slow heartbeat-dizziness, feeling faint or lightheaded, trouble breathing, unusual weakness or fatigue Liver injury-right upper belly pain, loss of appetite, nausea, light-colored stool, dark yellow or brown urine, yellowing skin or eyes, unusual weakness or fatigue Low blood pressure-dizziness, feeling faint or lightheaded, blurry vision Side effects that usually do not require medical attention (report to your careteam if they continue or are bothersome): Constipation Dizziness  Headache Nausea This list may not describe all possible side effects. Call your doctor for medical advice about side effects. You may report side effects to FDA at1-800-FDA-1088. Where should I keep my medication? Keep out of the reach of children and pets. Store at room temperature  between 20 and 25 degrees C (68 and 77 degrees F). Protect from moisture. Keep the container tightly closed. Avoid exposure toextreme heat. Throw away any unused medication after the expiration date. NOTE: This sheet is a summary. It may not cover all possible information. If you have questions about this medicine, talk to your doctor, pharmacist, orhealth care provider.  2022 Elsevier/Gold Standard (2020-12-21 09:40:28)

## 2021-06-09 NOTE — Progress Notes (Signed)
Botox consent signed Botox- 200 units x 1 vial Lot: [id number] Expiration: 11/2023 NDC: 2094-7096-28  Bacteriostatic 0.9% Sodium Chloride- 35mL total Lot: [id number] Expiration: 05/25/2022 NDC: 7654-6503-54  samples

## 2021-06-13 DIAGNOSIS — H16212 Exposure keratoconjunctivitis, left eye: Secondary | ICD-10-CM | POA: Diagnosis not present

## 2021-06-13 DIAGNOSIS — H02145 Spastic ectropion of left lower eyelid: Secondary | ICD-10-CM | POA: Diagnosis not present

## 2021-06-13 DIAGNOSIS — H02112 Cicatricial ectropion of right lower eyelid: Secondary | ICD-10-CM | POA: Diagnosis not present

## 2021-06-13 DIAGNOSIS — H02115 Cicatricial ectropion of left lower eyelid: Secondary | ICD-10-CM | POA: Diagnosis not present

## 2021-06-13 DIAGNOSIS — H02132 Senile ectropion of right lower eyelid: Secondary | ICD-10-CM | POA: Diagnosis not present

## 2021-06-13 DIAGNOSIS — H04213 Epiphora due to excess lacrimation, bilateral lacrimal glands: Secondary | ICD-10-CM | POA: Diagnosis not present

## 2021-06-13 DIAGNOSIS — H02135 Senile ectropion of left lower eyelid: Secondary | ICD-10-CM | POA: Diagnosis not present

## 2021-06-13 DIAGNOSIS — H16211 Exposure keratoconjunctivitis, right eye: Secondary | ICD-10-CM | POA: Diagnosis not present

## 2021-06-13 DIAGNOSIS — H04123 Dry eye syndrome of bilateral lacrimal glands: Secondary | ICD-10-CM | POA: Diagnosis not present

## 2021-06-13 DIAGNOSIS — H02142 Spastic ectropion of right lower eyelid: Secondary | ICD-10-CM | POA: Diagnosis not present

## 2021-06-13 DIAGNOSIS — H02011 Cicatricial entropion of right upper eyelid: Secondary | ICD-10-CM | POA: Diagnosis not present

## 2021-06-13 DIAGNOSIS — L121 Cicatricial pemphigoid: Secondary | ICD-10-CM | POA: Diagnosis not present

## 2021-06-14 DIAGNOSIS — H5711 Ocular pain, right eye: Secondary | ICD-10-CM | POA: Diagnosis not present

## 2021-06-14 DIAGNOSIS — H16143 Punctate keratitis, bilateral: Secondary | ICD-10-CM | POA: Diagnosis not present

## 2021-06-14 DIAGNOSIS — H16103 Unspecified superficial keratitis, bilateral: Secondary | ICD-10-CM | POA: Diagnosis not present

## 2021-06-14 DIAGNOSIS — H168 Other keratitis: Secondary | ICD-10-CM | POA: Diagnosis not present

## 2021-06-17 DIAGNOSIS — H18891 Other specified disorders of cornea, right eye: Secondary | ICD-10-CM | POA: Diagnosis not present

## 2021-06-17 DIAGNOSIS — Z79899 Other long term (current) drug therapy: Secondary | ICD-10-CM | POA: Diagnosis not present

## 2021-06-17 DIAGNOSIS — H16223 Keratoconjunctivitis sicca, not specified as Sjogren's, bilateral: Secondary | ICD-10-CM | POA: Diagnosis not present

## 2021-06-17 DIAGNOSIS — H1089 Other conjunctivitis: Secondary | ICD-10-CM | POA: Diagnosis not present

## 2021-06-17 DIAGNOSIS — H11233 Symblepharon, bilateral: Secondary | ICD-10-CM | POA: Diagnosis not present

## 2021-06-18 ENCOUNTER — Encounter: Payer: Self-pay | Admitting: Neurology

## 2021-06-18 ENCOUNTER — Other Ambulatory Visit: Payer: Self-pay | Admitting: Neurology

## 2021-06-19 ENCOUNTER — Other Ambulatory Visit: Payer: Self-pay | Admitting: Neurology

## 2021-06-19 MED ORDER — VERAPAMIL HCL ER 120 MG PO CP24: 120.0000 mg | ORAL_CAPSULE | Freq: Every day | ORAL | 6 refills | Status: DC

## 2021-06-20 ENCOUNTER — Encounter: Payer: Self-pay | Admitting: Neurology

## 2021-06-20 ENCOUNTER — Other Ambulatory Visit: Payer: Self-pay | Admitting: *Deleted

## 2021-06-20 ENCOUNTER — Other Ambulatory Visit: Payer: Self-pay

## 2021-06-20 ENCOUNTER — Ambulatory Visit: Payer: Medicare Other | Admitting: Neurology

## 2021-06-20 ENCOUNTER — Ambulatory Visit
Admission: RE | Admit: 2021-06-20 | Discharge: 2021-06-20 | Disposition: A | Discharge status: Home / Self Care | Payer: Medicare Other | Source: Ambulatory Visit | Attending: Neurology | Admitting: Neurology

## 2021-06-20 VITALS — BP 122/76 | HR 80

## 2021-06-20 DIAGNOSIS — H05221 Edema of right orbit: Secondary | ICD-10-CM | POA: Diagnosis not present

## 2021-06-20 DIAGNOSIS — H02403 Unspecified ptosis of bilateral eyelids: Secondary | ICD-10-CM

## 2021-06-20 DIAGNOSIS — H05011 Cellulitis of right orbit: Secondary | ICD-10-CM | POA: Diagnosis not present

## 2021-06-20 DIAGNOSIS — G44019 Episodic cluster headache, not intractable: Secondary | ICD-10-CM | POA: Diagnosis not present

## 2021-06-20 DIAGNOSIS — L03213 Periorbital cellulitis: Secondary | ICD-10-CM | POA: Diagnosis not present

## 2021-06-20 DIAGNOSIS — R600 Localized edema: Secondary | ICD-10-CM | POA: Diagnosis not present

## 2021-06-20 MED ORDER — NONFORMULARY OR COMPOUNDED ITEM: 2 refills | Status: DC

## 2021-06-20 MED ORDER — IOPAMIDOL (ISOVUE-300) INJECTION 61%
75.0000 mL | Freq: Once | INTRAVENOUS | Status: AC | PRN
  Administered 2021-06-20: 75 mL via INTRAVENOUS

## 2021-06-20 MED ORDER — EMGALITY 120 MG/ML ~~LOC~~ SOAJ: 360.0000 mg | Freq: Once | SUBCUTANEOUS | 0 refills | Status: AC

## 2021-06-20 MED ORDER — APRACLONIDINE HCL 0.5 % OP SOLN: 1.0000 [drp] | Freq: Two times a day (BID) | OPHTHALMIC | 2 refills | Status: DC | PRN

## 2021-06-20 MED ORDER — GALCANEZUMAB-GNLM 120 MG/ML ~~LOC~~ SOAJ: 360.0000 mg | Freq: Once | SUBCUTANEOUS | Status: DC

## 2021-06-20 NOTE — Progress Notes (Signed)
Administered Emgality 120 mg x 3 per v.o. Dr Jaynee Eagles into skin (L arm, RLQ of abdomen). Pt tolerated well. Bandaids applied. Pt and wife (who is a [employer] scientist) educated on medication administration.

## 2021-06-20 NOTE — Progress Notes (Signed)
Per Dr Jaynee Eagles, order Lidocaine nasal spray. I called [facility] and found out they can do this for the patient. They have 4% Lidocaine. Per Dr Jaynee Eagles, placed order for 4% Lidocaine nasal spray. Place 2 sprays into right nostril every 1 hour PRN headache. Prescription faxed to [facility] at [phone].

## 2021-06-20 NOTE — Progress Notes (Signed)
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  He has intractable periorbital pain in the right with unilateral lacrimation and rhinorrhea.  Initially thought this might be a secondary headache from the very rare ocular pemphigoid, but at this time it appears he has concomitant cluster headaches; the rarity of ocular pemphigoid in addition with cluster headaches is quite unusual.  Today he has ptosis, likely from botox. Botox x 2 has not helped.  We have tried oral triptans, Nurtec, indomethacin, and none have relieved his symptoms.  Indomethacin was titrated up to 150 mg a day does not appear to be an indomethacin responsive headache but can try going up to 225 mg a day.  We have tried him on Topamax, Zonegran, high-dose steroids for over a month.  We have discontinued his amlodipine and carvedilol and we started verapamil.  Today we also injected him with Emgality.  I performed nerve blocks as well as sphenopalatine ganglion block.  Our plan is to do the following:  Continue with nerve blocks and sphenopalatine ganglion blocks to see if we can make him comfortable he is in severe pain.  We also called [facility] and we are compounding 4% lidocaine and a nasal spray for him to use at home.  He had side effects to Imitrex, discussed trying nasal Imitrex or injections but given his reaction which she does not really remember he does not want to do that. Emgality 300 mg monthly Start verapamil today which is first-line for cluster headache started 120 mg and start titrating, goal is 360 but per literature often times have to go to 480 or even higher. Botox has provided no benefit whatsoever, no good literature to show that it does help in cluster headaches, will stop Botox. I did send eyedrops to help with the ptosis which should resolved in a few weeks. 6.  We are going to try to get him oxygen 15 L high flow for acute use as well. 7.  MRI of the  brain was negative.  Ordered CT of the orbits which showed swelling underneath the right eyelid suggestive of orbital cellulitis, I did speak with patient today informed him I do not think it is orbital cellulitis I think it is where the nerve blocks were injected making a look like edema, patient is concerned and he says the swelling has been there when he would like antibiotics, I think this is reasonable I will send him any antibiotics. Keflex 500mg  bid start 7 days.  Meds ordered this encounter  Medications   DISCONTD: Galcanezumab-gnlm SOAJ 360 mg   Galcanezumab-gnlm (EMGALITY) 120 MG/ML SOAJ    Sig: Inject 360 mg into the skin once for 1 dose.    Dispense:  3 mL    Refill:  0    Order Specific Question:   Lot Number?    Answer:   [id number]    Order Specific Question:   Expiration Date?    Answer:   02/07/2023    Order Specific Question:   Quantity    Answer:   3   apraclonidine (IOPIDINE) 0.5 % ophthalmic solution    Sig: Place 1 drop into both eyes 2 (two) times daily as needed. For ptosis caused by botox.    Dispense:  5 mL    Refill:  2   cephALEXin (KEFLEX) 500 MG capsule    Sig: Take 1 capsule (500 mg total) by mouth 2 (two) times daily.  Dispense:  14 capsule    Refill:  0   Orders Placed This Encounter  Procedures   CT ORBITS W CONTRAST     I spent 90 minutes of face-to-face and non-face-to-face time with patient on the  1. Episodic cluster headache, not intractable   2. Orbital edema, right   3. Ptosis of both eyelids    diagnosis.  This included previsit chart review, lab review, study review, order entry, electronic health record documentation, patient education on the different diagnostic and therapeutic options, counseling and coordination of care, risks and benefits of management, compliance, or risk factor reduction

## 2021-06-20 NOTE — Patient Instructions (Addendum)
Plan: Lidocaine compound in nasal spray acutely Nerve Blocks around the eye as needed Emgality monthly (come to the office and we will use samples) Stop Botox Oxygen 15L high flow acutely  Cluster Headache A cluster headache is a type of primary headache that causes deep, intense head pain. Cluster headaches can last from 15 minutes to 3 hours. They usually occur on one side of the head or face. They may occur on the other side of the headwhen a new cluster of headaches begins. Cluster headaches occur repeatedly over weeks to months. They may happen several times a day. They often occur at the same time of day, often at night.They may happen more often in the fall and springtime. What are the causes? The cause of this condition is not known. Unlike migraine and tension headache, a cluster headache generally is not associated with triggers, such as foods,hormonal changes, or stress. What increases the risk? The following factors may make you likely to develop this condition: Being a male between the ages of 59-34 years old. Smoking or using products that contain nicotine or tobacco. Having elevated levels of histamine. This can happen in people who have allergies. Taking medicines that cause blood vessels to expand, such as nitroglycerin. Having a parent or sibling who has cluster headaches. What are the signs or symptoms? Symptoms of this condition include: Extremely bad pain on one side of the head that begins behind or around your eye or temple but may radiate to other areas of your face, head, and neck. Nausea. Sensitivity to light. Runny nose and nasal stuffiness. Forehead or facial sweating on the affected side. Droopy or swollen eyelid, eye redness, or tearing on the affected side. Restlessness and agitation. Pale skin (pallor) or flushing on your face. How is this diagnosed? This condition may be diagnosed based on: Your description of the attacks, including your pain, the  location and severity of your headaches, and symptoms. Your health care provider may also ask how often your headaches occur and how long they last. A neurological examination to detect physical signs of a neurological disorder. Your health care provider will test your senses, reflexes, and nerves. The exam is usually normal in people who have cluster headaches. Tests. Your health care provider may order additional tests to see if your headaches are caused by another medical condition. These tests may show that you do not have cluster headaches. Tests may include: MRI. CT scan. Blood tests. How is this treated? This condition may be treated with: Medicines to relieve pain and to prevent repeated attacks. Some people may need a combination of medicines. Oxygen that is breathed in through a mask. This helps to relieve pain of an attack in 15-20 minutes. Follow these instructions at home: Headache diary Keep a headache diary as told by your health care provider. Doing this can help you and your health care provider figure out what triggers your headaches. In your headache diary, include information about: The time of day that your headache started and what you were doing when it began. How long your headache lasted. Where your pain started and whether it moved to other areas. The type of pain, such as burning, stabbing, throbbing, or cramping. Your level of pain. Use a pain scale and rate the pain with a number from 1 (mild) up to 10 (severe). The treatment that you used, and any change in symptoms after treatment.  Medicines Take over-the-counter and prescription medicines only as told by your health care provider.  Ask your health care provider if the medicine prescribed to you: Requires you to avoid driving or using heavy machinery. Can cause constipation. You may need to take these actions to prevent or treat constipation: Drink enough fluid to keep your urine pale yellow. Take  over-the-counter or prescription medicines. Eat foods that are high in fiber, such as beans, whole grains, and fresh fruits and vegetables. Limit foods that are high in fat and processed sugars, such as fried or sweet foods. Lifestyle  Follow a regular sleep schedule. Do not vary the time that you go to bed or the amount that you sleep from day to day. It is important to stay on the same schedule during a cluster period to help prevent headaches. Get 7-9 hours of sleep each night, or the amount recommended by your health care provider. Limit or manage stress. Consider stress relief options such as acupuncture, counseling, biofeedback, and massage. Talk with your health care provider about which methods might be good for you. Exercise regularly. Exercise for at least 30 minutes, 5 times each week. Moderate exercise may be best. Eat a healthy diet and avoid any specific foods that may trigger your headaches. Do not drink alcohol. Drinking alcohol may quickly trigger a severe headache. Do not use any products that contain nicotine or tobacco, such as cigarettes, e-cigarettes, and chewing tobacco. If you need help quitting, ask your health care provider.  General instructions Use oxygen as told by your health care provider. Keep all follow-up visits as told by your health care provider. This is important. Contact a health care provider if: Your headaches change, become more severe, or occur more often. The medicine or oxygen that your health care provider recommended does not help. Get help right away if you: Faint. Have weakness or numbness, especially on one side of your body or face. Have double vision. Have nausea or vomiting that does not go away within several hours. Have trouble talking, walking, or keeping your balance. Have pain or stiffness in your neck and you have a fever. Summary A cluster headache is a type of primary headache that causes deep, intense head pain, usually on one  side of the head. Keep a headache diary to help discover what triggers your headaches. Avoiding alcohol and certain medications may help prevent cluster headaches. There are many treatments for cluster headaches including oxygen, medications to stop a headache, and medications to prevent the headaches. Talk to your doctor about treatment options. This information is not intended to replace advice given to you by your health care provider. Make sure you discuss any questions you have with your healthcare provider. Document Revised: 01/15/2020 Document Reviewed: 01/15/2020 Elsevier Patient Education  2022 [employer].

## 2021-06-21 ENCOUNTER — Other Ambulatory Visit: Payer: Self-pay | Admitting: *Deleted

## 2021-06-21 ENCOUNTER — Telehealth: Payer: Self-pay | Admitting: *Deleted

## 2021-06-21 DIAGNOSIS — G44019 Episodic cluster headache, not intractable: Secondary | ICD-10-CM

## 2021-06-21 MED ORDER — CEPHALEXIN 500 MG PO CAPS: 500.0000 mg | ORAL_CAPSULE | Freq: Two times a day (BID) | ORAL | 0 refills | Status: DC

## 2021-06-21 NOTE — Telephone Encounter (Signed)
Spoke with the patient.  Discussed the CT orbit results with patient.  The patient advised that his wife believes the swelling has actually been in his right eye for a couple of days.  Dr. Jaynee Eagles was made aware of this and said she will call in an antibiotic to his pharmacy.  The patient also mentioned that he did have temporary relief from the nerve blocks however the pain came back around 11 AM today and has been bad.  He also mentioned he had an episode of diarrhea.  Patient aware per Dr. Jaynee Eagles this is not a known side effect of Emgality.  The patient's questions were answered.  He is currently awaiting the lidocaine nasal spray from [facility] and should receive this today.  He is aware of the instructions.  He verbalized appreciation for the call.

## 2021-06-21 NOTE — Telephone Encounter (Signed)
-----   Message from Melvenia Beam, MD sent at 06/20/2021  3:32 PM EDT ----- Essentially normal. They saw some swelling in the right inferior eyelid which they said may be periorbital cellulitis however that is where I placed the nerve blocks and that may be what they think they see. I don't think there is cellulitis but I would watch it closely - that is where I injected nerve block and it prob looked like edema. Otherwise normal examination.

## 2021-06-23 ENCOUNTER — Encounter: Payer: Self-pay | Admitting: Internal Medicine

## 2021-06-23 ENCOUNTER — Ambulatory Visit (INDEPENDENT_AMBULATORY_CARE_PROVIDER_SITE_OTHER): Payer: Medicare Other | Admitting: Neurology

## 2021-06-23 ENCOUNTER — Other Ambulatory Visit: Payer: Self-pay

## 2021-06-23 ENCOUNTER — Ambulatory Visit (INDEPENDENT_AMBULATORY_CARE_PROVIDER_SITE_OTHER): Payer: Medicare Other | Admitting: Internal Medicine

## 2021-06-23 DIAGNOSIS — E440 Moderate protein-calorie malnutrition: Secondary | ICD-10-CM | POA: Diagnosis not present

## 2021-06-23 DIAGNOSIS — R519 Headache, unspecified: Secondary | ICD-10-CM

## 2021-06-23 DIAGNOSIS — R634 Abnormal weight loss: Secondary | ICD-10-CM

## 2021-06-23 DIAGNOSIS — E46 Unspecified protein-calorie malnutrition: Secondary | ICD-10-CM | POA: Insufficient documentation

## 2021-06-23 DIAGNOSIS — L129 Pemphigoid, unspecified: Secondary | ICD-10-CM | POA: Diagnosis not present

## 2021-06-23 DIAGNOSIS — G44099 Other trigeminal autonomic cephalgias (TAC), not intractable: Secondary | ICD-10-CM

## 2021-06-23 DIAGNOSIS — E119 Type 2 diabetes mellitus without complications: Secondary | ICD-10-CM | POA: Diagnosis not present

## 2021-06-23 DIAGNOSIS — F329 Major depressive disorder, single episode, unspecified: Secondary | ICD-10-CM

## 2021-06-23 DIAGNOSIS — G44019 Episodic cluster headache, not intractable: Secondary | ICD-10-CM | POA: Diagnosis not present

## 2021-06-23 MED ORDER — MEGESTROL ACETATE 40 MG PO TABS: 40.0000 mg | ORAL_TABLET | Freq: Every day | ORAL | 5 refills | Status: DC

## 2021-06-23 MED ORDER — LIDOCAINE 5 % EX PTCH: 1.0000 | MEDICATED_PATCH | CUTANEOUS | 11 refills | Status: DC

## 2021-06-23 NOTE — Assessment & Plan Note (Signed)
We can try Megace - Rx given

## 2021-06-23 NOTE — Patient Instructions (Addendum)
Using TENS therapy for Face pain After a diagnosis of trigeminal neuralgia or other chronic face pain, your pain management plan will include several methods of pain relief and should offer drug-free options such as TENS therapy as well as medication.  TENS analgesia may be used as a short term pain relief mechanism when you first get a toothache, while you await your dental appointment.  If the cause of your face pain is stress related or exacerbated by stress, then TENS therapy may be of assistance. You can place pads on your face, brow, bottom of neck, shoulders, cervical spine or wrists.  ActivBody is the TENS machine we suggest for chronic or acute pain of the face or jaw. There are many conditions that can cause chronic face pain, e.g. Persistent idiopathic facial pain, Atypical odontalgia, Burning mouth syndrome.    Rice sock heating pad

## 2021-06-23 NOTE — Progress Notes (Signed)
Subjective:  Patient ID: Louis Kemp, male    DOB: 06-06-1941  Age: 81 y.o. MRN: [id number]  CC: Follow-up (6 week f/u)   HPI Louis Kemp presents for R cluster HAs, wt loss off prednisone, R eye pain The patient is here with his wife Pain is 7/10  Outpatient Medications Prior to Visit  Medication Sig Dispense Refill   apraclonidine (IOPIDINE) 0.5 % ophthalmic solution Place 1 drop into both eyes 2 (two) times daily as needed. For ptosis caused by botox. 5 mL 2   butalbital-acetaminophen-caffeine (FIORICET) 50-325-40 MG tablet Take 1-2 tablets by mouth every 6 (six) hours as needed for headache. 60 tablet 0   Cholecalciferol (VITAMIN D) 2000 UNITS CAPS Take 1 capsule by mouth every other day.     cyanocobalamin (,VITAMIN B-12,) 1000 MCG/ML injection Inject 1 mL (1,000 mcg total) into the muscle every 14 (fourteen) days. 30 mL 3   escitalopram (LEXAPRO) 5 MG tablet Take 1 tablet (5 mg total) by mouth at bedtime. 30 tablet 5   fenofibrate 160 MG tablet Take 1 tablet (160 mg total) by mouth daily. 90 tablet 3   HYDROcodone-acetaminophen (NORCO) 7.5-325 MG tablet Take 1-2 tablets by mouth every 4 (four) hours as needed for moderate pain. 60 tablet 0   lidocaine (LIDODERM) 5 % Place 1 patch onto the skin daily. Remove & Discard patch within 12 hours or as directed by MD 30 patch 11   metFORMIN (GLUCOPHAGE) 500 MG tablet TAKE 1 TABLET BY MOUTH 3 TIMES A DAY. 270 tablet 1   NONFORMULARY OR COMPOUNDED ITEM 4% Lidocaine nasal spray. Directions: Place 2 sprays into right nostril every 1 hour as needed for headache. 1 each 2   tamsulosin (FLOMAX) 0.4 MG CAPS capsule Take 1 capsule (0.4 mg total) by mouth daily. 90 capsule 3   verapamil (VERELAN PM) 120 MG 24 hr capsule Take 1 capsule (120 mg total) by mouth at bedtime. 30 capsule 6   cephALEXin (KEFLEX) 500 MG capsule Take 1 capsule (500 mg total) by mouth 2 (two) times daily. 14 capsule 0   indomethacin (INDOCIN SR) 75 MG CR capsule Take 1  capsule (75 mg total) by mouth 2 (two) times daily with a meal. (Patient not taking: Reported on 06/20/2021) 60 capsule 3   No facility-administered medications prior to visit.    ROS: Review of Systems  Constitutional:  Positive for fatigue and unexpected weight change. Negative for appetite change.  HENT:  Negative for congestion, nosebleeds, sneezing, sore throat and trouble swallowing.   Eyes:  Positive for visual disturbance. Negative for itching.  Respiratory:  Negative for cough.   Cardiovascular:  Negative for chest pain, palpitations and leg swelling.  Gastrointestinal:  Negative for abdominal distention, blood in stool, diarrhea and nausea.  Genitourinary:  Negative for frequency and hematuria.  Musculoskeletal:  Negative for back pain, gait problem, joint swelling and neck pain.  Skin:  Negative for rash.  Neurological:  Positive for weakness and headaches. Negative for dizziness, tremors and speech difficulty.  Psychiatric/Behavioral:  Negative for agitation, dysphoric mood and sleep disturbance. The patient is not nervous/anxious.    Objective:  BP 132/72 (BP Location: Left Arm)   Pulse 81   Temp 98 F (36.7 C) (Oral)   Ht 5\' 6"  (1.676 m)   Wt 122 lb 9.6 oz (55.6 kg)   SpO2 97%   BMI 19.79 kg/m   BP Readings from Last 3 Encounters:  06/23/21 132/72  06/20/21 122/76  05/19/21  118/68    Wt Readings from Last 3 Encounters:  06/23/21 122 lb 9.6 oz (55.6 kg)  05/19/21 129 lb (58.5 kg)  05/12/21 131 lb 9.6 oz (59.7 kg)    Physical Exam Constitutional:      General: He is not in acute distress.    Appearance: He is well-developed.     Comments: NAD  Eyes:     Conjunctiva/sclera: Conjunctivae normal.     Pupils: Pupils are equal, round, and reactive to light.  Neck:     Thyroid: No thyromegaly.     Vascular: No JVD.  Cardiovascular:     Rate and Rhythm: Normal rate and regular rhythm.     Heart sounds: Normal heart sounds. No murmur heard.   No friction  rub. No gallop.  Pulmonary:     Effort: Pulmonary effort is normal. No respiratory distress.     Breath sounds: Normal breath sounds. No wheezing or rales.  Chest:     Chest wall: No tenderness.  Abdominal:     General: Bowel sounds are normal. There is no distension.     Palpations: Abdomen is soft. There is no mass.     Tenderness: There is no abdominal tenderness. There is no guarding or rebound.  Musculoskeletal:        General: No tenderness. Normal range of motion.     Cervical back: Normal range of motion and neck supple.  Lymphadenopathy:     Cervical: No cervical adenopathy.  Skin:    General: Skin is warm and dry.     Findings: No rash.  Neurological:     Mental Status: He is alert and oriented to person, place, and time.     Cranial Nerves: No cranial nerve deficit.     Motor: No abnormal muscle tone.     Coordination: Coordination normal.     Gait: Gait normal.     Deep Tendon Reflexes: Reflexes are normal and symmetric.  Psychiatric:        Behavior: Behavior normal.        Thought Content: Thought content normal.        Judgment: Judgment normal.  Louis Kemp looks a lot better, however, he is uncomfortable from pain  Lab Results  Component Value Date   WBC 8.9 05/12/2021   HGB 11.9 (L) 05/12/2021   HCT 35.9 (L) 05/12/2021   PLT 289.0 05/12/2021   GLUCOSE 204 (H) 05/12/2021   CHOL 160 11/10/2020   TRIG 139.0 11/10/2020   HDL 38.20 (L) 11/10/2020   LDLCALC 94 11/10/2020   ALT 20 05/12/2021   AST 17 05/12/2021   NA 139 05/12/2021   K 4.4 05/12/2021   CL 104 05/12/2021   CREATININE 1.30 05/12/2021   BUN 17 05/12/2021   CO2 29 05/12/2021   TSH 1.04 11/10/2020   PSA 0.63 11/10/2020   INR 1.01 09/03/2014   HGBA1C 8.1 (H) 05/12/2021   MICROALBUR 1.8 11/10/2020    CT ORBITS W CONTRAST  Result Date: 06/20/2021 CLINICAL DATA:  Uveitis with periorbital cellulitis EXAM: CT ORBITS WITH CONTRAST TECHNIQUE: Multidetector CT images was performed according to the  standard protocol following intravenous contrast administration. CONTRAST:  70mL ISOVUE-300 IOPAMIDOL (ISOVUE-300) INJECTION 61% COMPARISON:  No pertinent prior exam. FINDINGS: Orbits: Bilateral cataract extraction. Normal globe bilaterally. No orbital mass edema or abscess. Mild soft tissue thickening of the right upper eyelid. Normal left eyelid. Visible paranasal sinuses: Mild mucosal edema left maxillary sinus. Remaining sinuses clear. Soft tissues: Mild soft tissue thickening  right inferior eyelid. Otherwise no soft tissue edema, mass, or fluid collection Osseous: Negative Limited intracranial: Negative IMPRESSION: Mild mucosal edema right inferior eyelid which may be due to periorbital cellulitis. No orbital mass or abscess. Electronically Signed   By: Franchot Gallo M.D.   On: 06/20/2021 15:15    Assessment & Plan:    Follow-up: No follow-ups on file.  Walker Kehr, MD

## 2021-06-23 NOTE — Progress Notes (Signed)
O2 at 15L applied via re-breather mask at 11:19 AM for 15 minutes. Removed at 11:34 AM.  Nerve block w/o steroid: Pt signed consent   0.5% Bupivocaine 3 mL LOT: [id number] EXP: 08/2021 [place]: 32951-884-16   2% Lidocaine 3 mL LOT: [id number] EXP: 02/2022 [place]: 09323-557-32

## 2021-06-23 NOTE — Assessment & Plan Note (Addendum)
Try TENs unit, rice bag heating pad for pain

## 2021-06-23 NOTE — Assessment & Plan Note (Addendum)
Continue Norco prn  Potential benefits of a short/long term opioids use as well as potential risks (i.e. addiction risk, apnea etc) and complications (i.e. Somnolence, constipation and others) were explained to the patient and were aknowledged. Lidoderm Injections/nerve block O2 as needed at 15 L/min Try TENS unit Rice bag heating pad

## 2021-06-23 NOTE — Assessment & Plan Note (Signed)
Wt loss. We can try Megace - Rx given

## 2021-06-23 NOTE — Progress Notes (Signed)
Patient improved with 15L high flow oxygen for 15 minutes. Also performed nerve block again.  Performed by Dr. Jaynee Eagles M.D. 48ml Lidocaine 1%,63ml Marcaine 0.5% in the 30-gauge needle was used. All procedures a documented blood were medically necessary, reasonable and appropriate based on the patient's history, medical diagnosis and physician opinion. Verbal informed consent was obtained from the patient, patient was informed of potential risk of procedure, including bruising, bleeding, hematoma formation, infection, muscle weakness, muscle pain, numbness, transient hypertension, transient hyperglycemia and transient insomnia among others. All areas injected were topically clean with isopropyl rubbing alcohol. Nonsterile nonlatex gloves were worn during the procedure.  1. Supraorbital nerve block (64400): Supraorbital nerve site was identified along the incision of the frontal bone on the orbital/supraorbital ridge. Medication was injected into the right supraorbital nerve areas. Patient's condition is associated with inflammation of the supraorbital and associated muscle groups. Injection was deemed medically necessary, reasonable and appropriate. Injection represents a separate and unique surgical service.

## 2021-06-27 NOTE — Assessment & Plan Note (Signed)
Situational depression due to multiple medical issues.  Continue low-dose Lexapro at night

## 2021-06-27 NOTE — Assessment & Plan Note (Signed)
Continue with metformin and Prandin.

## 2021-06-28 MED ORDER — VERAPAMIL HCL ER 240 MG PO TBCR: 240.0000 mg | EXTENDED_RELEASE_TABLET | Freq: Every day | ORAL | 6 refills | Status: DC

## 2021-07-12 DIAGNOSIS — Z961 Presence of intraocular lens: Secondary | ICD-10-CM | POA: Diagnosis not present

## 2021-07-12 DIAGNOSIS — L121 Cicatricial pemphigoid: Secondary | ICD-10-CM | POA: Diagnosis not present

## 2021-07-21 ENCOUNTER — Ambulatory Visit: Payer: Medicare Other | Admitting: Neurology

## 2021-07-21 ENCOUNTER — Other Ambulatory Visit: Payer: Self-pay

## 2021-07-21 VITALS — BP 124/68 | HR 80 | Ht 66.0 in | Wt 125.2 lb

## 2021-07-21 DIAGNOSIS — L121 Cicatricial pemphigoid: Secondary | ICD-10-CM | POA: Diagnosis not present

## 2021-07-21 DIAGNOSIS — G44019 Episodic cluster headache, not intractable: Secondary | ICD-10-CM | POA: Diagnosis not present

## 2021-07-21 MED ORDER — EMGALITY 120 MG/ML ~~LOC~~ SOAJ: 360.0000 mg | Freq: Once | SUBCUTANEOUS | 0 refills | Status: AC

## 2021-07-21 NOTE — Progress Notes (Signed)
Emgality 120 mg x 3 pens injected in abdomen per v.o. Dr Jaynee Eagles. Pt tolerated well.

## 2021-07-22 DIAGNOSIS — H11233 Symblepharon, bilateral: Secondary | ICD-10-CM | POA: Diagnosis not present

## 2021-07-22 DIAGNOSIS — H16143 Punctate keratitis, bilateral: Secondary | ICD-10-CM | POA: Diagnosis not present

## 2021-07-22 DIAGNOSIS — H16103 Unspecified superficial keratitis, bilateral: Secondary | ICD-10-CM | POA: Diagnosis not present

## 2021-07-22 DIAGNOSIS — H01111 Allergic dermatitis of right upper eyelid: Secondary | ICD-10-CM | POA: Diagnosis not present

## 2021-07-23 NOTE — Progress Notes (Signed)
[facility]    Provider:  Dr Jaynee Eagles Requesting Provider: [name], Evie Lacks, MD Primary Care Provider:  [name], Evie Lacks, MD  CC:  Right temporal hedache  Interval history: Patient her etoday, he is feeling much better. We have him on verapamil and have given him Emaglity cluster headache dose (today will be the second dose). Lidocaine spray, oxygen, nerve block, sphenopalatine blocks did not last.   Interval history: Patient here for intractable episodic cluster headaches in the setting of very rare ocular pemphigoid.  He has intractable periorbital pain in the right with unilateral lacrimation and rhinorrhea.  Initially thought this might be a secondary headache from the very rare ocular pemphigoid, but at this time it appears he has concomitant cluster headaches; the rarity of ocular pemphigoid in addition with cluster headaches is quite unusual.  Today he has ptosis, likely from botox. Botox x 2 has not helped.  We have tried oral triptans, Nurtec, indomethacin, and none have relieved his symptoms.  Indomethacin was titrated up to 150 mg a day does not appear to be an indomethacin responsive headache but can try going up to 225 mg a day.  We have tried him on Topamax, Zonegran, high-dose steroids for over a month.  We have discontinued his amlodipine and carvedilol and we started verapamil.  Today we also injected him with Emgality.  I performed nerve blocks as well as sphenopalatine ganglion block.  Our plan is to do the following:  Interval history: The patient describes a headache primarily as a constant boring pain.  Certainly, the possibility of hemicrania continua does need to be considered or Cluster headache.  A trial indomethacin(or verapamil or Emgality) may be of some diagnostic benefit. He did not tolerate topamax but may have helped, neither tolerated zonegran, we have tried botox and will repeat, he declines depakote. Will titrate him off of prenisone and try  Indomethacin. If no benefit will try Emgality or Verapamil.   Physially he is better, more energy, still having some redness of the right eye but maybe slightly improved, vision has not improved, Stopped the cellcept and on the rituximab.   Interval history: The pain is on the right temple, it happens in the afternoon or overnight about the same time every day, right eye waters(both eyes water), other wise headache wakes him up if he does not take medication, his blood pressure has been a little low he was feeling weak and it was 825 systolic. Taking the amlodipine at 6pm, taking the carvedilol 6-7a, 10pm. BP 108/77 and tachycardic. He took gatorade yesterday and he was better but this morning again his BP was a little low. We decided to do nerve blocks today. We discussed topamax, CGRP meds, unfrotunately difficult to assess and treat unclear etiology of headaches - associated with his pemphigoid eye disease or is this an independent headache syndrome with associated autonomic symptoms? If no improvement with rituximab this Thursday we will change course.   Interval history 01/24/2021: I spoke to patient's [place] physician, we are going to try and get infusion here in the office. He also continues on Prednisone. Temple pain/headache is 85% better but he still has pain supraorbitally. Only at night or in the afternoon. But he is very improved. We discussed titrating the steroids and we will stay on 53m for one month then slowly decrease by 131mmonthly. We discussed checking hgba1c and his glucose, I recommended he check his glucose daily, he is feeling well, no problems, his eyes feel  much better and are not as red or painful. We received some notes from Dr. Sharen Counter, but no orders, I have contacted him to get specifics on the pre-treatment, the injection doses and any labwork he wants prior to infusions. He is taking omeprazole daily, taking steroids with food as well, no GI upset.   HPI:  Louis Kemp is a  80 y.o. male here as requested by [name], Evie Lacks, MD for headaches. PMHx ocular pemphigoid, B12 deficiency, HTN, PAC, DM2. New onset right temporal headache, continuous, sever at times.  Headaches started 2 weeks ago, it wasn't that bad but it is worsening and now can be severe, it is in the temple and radiates to the temple and frontal area. It becomes quite severe 7-8/10. Sharp pain, shooting pain, he had prior headache in the left eye which was not similar it was ocular pain his eye was so inflamed due to the Pemphigoid. This is not the same, this is new, and is not anything he has ever experienced. He has noticed in the last 4-5 days when he eats he has jaw pain. The right eye vision is worsening. No significant history of migraines. No other significant history of headaches, he had one migraine in the remote past and this is nothing like it. The pain is continuous, no known triggers. Alleve is not working. Had a long discussion with patient regarding possiibilites and differential. No other focal neurologic deficits, associated symptoms, inciting events or modifiable factors.  Reviewed notes, labs and imaging from outside physicians, which showed:  Personally reviewed imaging with Dr. Mamie Nick Tendler(Neuroradiology) and agree with the following:mild T2 hyperintensities, mild cerebral atrophy, otherwise unremarkable for age.   11/10/2020: TSH nml, B12 1056, cbc unremarkable, CMP elevated glucose and reduced GFR 52 otherwise normal Review of Systems: Patient complains of symptoms per HPI as well as the following symptoms: headache, vision changes . Pertinent negatives and positives per HPI. All others negative   Social History   Socioeconomic History   Marital status: Married    Spouse name: [name]   Number of children: Not on file   Years of education: Not on file   Highest education level: Not on file  Occupational History   Occupation: MD Retired  Tobacco Use   Smoking status: Former     Types: Cigars   Smokeless tobacco: Never   Tobacco comments:    none in 8 years  Vaping Use   Vaping Use: Never used  Substance and Sexual Activity   Alcohol use: Yes    Comment: maybe 1 mixed drink every 3 weeks or so   Drug use: No    Comment: 3-4 cigars   year   Sexual activity: Not Currently  Other Topics Concern   Not on file  Social History Narrative   Regular Exercise -  YES, golf   Lives at home with wife    Right handed   Caffeine:  1-2 cups/day   Social Determinants of Health   Financial Resource Strain: Not on file  Food Insecurity: Not on file  Transportation Needs: Not on file  Physical Activity: Not on file  Stress: Not on file  Social Connections: Not on file  Intimate Partner Violence: Not on file    Family History  Problem Relation Age of Onset   Arthritis Mother    Hypertension Father    Coronary artery disease Father    Hypertension Brother    Hypertension Other    Hypertension Brother  Coronary artery disease Other     Past Medical History:  Diagnosis Date   BPH (benign prostatic hypertrophy)    Hyperlipidemia    Hypertension    LBP (low back pain)    MVP (mitral valve prolapse)    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   on cellcept   Osteoarthritis    PAC (premature atrial contraction)    Statin intolerance    Type II or unspecified type diabetes mellitus without mention of complication, not stated as uncontrolled 2011   Vitamin B 12 deficiency 2011   Vitamin D deficiency    Vitiligo     Patient Active Problem List   Diagnosis Date Noted   Malnutrition ([place]) 06/23/2021   Episodic cluster headache, not intractable 06/09/2021   Ocular pemphigoid 05/19/2021   Chronic sinusitis 05/12/2021   Depression 05/12/2021   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   Right sided temporal headache 01/06/2021   Chronic renal insufficiency, stage 3 (moderate) ([place]) 05/11/2020   Pemphigoid 05/10/2020   Elevated serum creatinine  07/11/2018   Hip pain, acute, right 01/10/2018   Postoperative anemia due to acute blood loss 09/30/2014   Status post left partial knee replacement 09/15/2014   S/P left UKR 09/14/2014   S/P right TKA 06/15/2014   Osteoarthritis, knee 11/24/2013   Weight loss 06/24/2013   PAC (premature atrial contraction) 01/13/2013   Heart murmur 01/13/2013   Well adult exam 11/03/2012   Paresthesia 10/22/2012   Hypertension 04/18/2012   Diabetes type 2, controlled ([place]) 10/20/2010   B12 deficiency 10/20/2010   Vitamin D deficiency 10/20/2010   Dyslipidemia 07/14/2010   BENIGN PROSTATIC HYPERTROPHY 07/14/2010   OSTEOARTHRITIS 07/14/2010   LOW BACK PAIN 07/14/2010   HYPERGLYCEMIA 07/14/2010    Past Surgical History:  Procedure Laterality Date   arm surgery Left    ORIF left  forearm   CATARACT EXTRACTION  2012   EYE SURGERY     HERNIA REPAIR Bilateral 1971   PARTIAL KNEE ARTHROPLASTY Left 09/14/2014   Procedure: LEFT UNICOMPARTMENTAL KNEE ARTHROPLASTY MEDIALLY;  Surgeon: Mauri Pole, MD;  Location: [facility];  Service: Orthopedics;  Laterality: Left;   TOTAL KNEE ARTHROPLASTY Right 06/15/2014   Procedure: RIGHT TOTAL KNEE ARTHROPLASTY;  Surgeon: Mauri Pole, MD;  Location: [facility];  Service: Orthopedics;  Laterality: Right;    Current Outpatient Medications  Medication Sig Dispense Refill   apraclonidine (IOPIDINE) 0.5 % ophthalmic solution Place 1 drop into both eyes 2 (two) times daily as needed. For ptosis caused by botox. 5 mL 2   Cholecalciferol (VITAMIN D) 2000 UNITS CAPS Take 1 capsule by mouth every other day.     cyanocobalamin (,VITAMIN B-12,) 1000 MCG/ML injection Inject 1 mL (1,000 mcg total) into the muscle every 14 (fourteen) days. 30 mL 3   escitalopram (LEXAPRO) 5 MG tablet Take 1 tablet (5 mg total) by mouth at bedtime. 30 tablet 5   fenofibrate 160 MG tablet Take 1 tablet (160 mg total) by mouth daily. 90 tablet 3   HYDROcodone-acetaminophen (NORCO) 7.5-325 MG tablet  Take 1-2 tablets by mouth every 4 (four) hours as needed for moderate pain. 60 tablet 0   metFORMIN (GLUCOPHAGE) 500 MG tablet TAKE 1 TABLET BY MOUTH 3 TIMES A DAY. 270 tablet 1   NONFORMULARY OR COMPOUNDED ITEM 4% Lidocaine nasal spray. Directions: Place 2 sprays into right nostril every 1 hour as needed for headache. 1 each 2   tamsulosin (FLOMAX) 0.4 MG CAPS capsule Take 1 capsule (0.4 mg total)  by mouth daily. 90 capsule 3   verapamil (CALAN-SR) 240 MG CR tablet Take 1 tablet (240 mg total) by mouth at bedtime. Please stop amlodipine. 30 tablet 6   butalbital-acetaminophen-caffeine (FIORICET) 50-325-40 MG tablet Take 1-2 tablets by mouth every 6 (six) hours as needed for headache. (Patient not taking: Reported on 07/21/2021) 60 tablet 0   lidocaine (LIDODERM) 5 % Place 1 patch onto the skin daily. Remove & Discard patch within 12 hours or as directed by MD (Patient not taking: Reported on 07/21/2021) 30 patch 11   megestrol (MEGACE) 40 MG tablet Take 1 tablet (40 mg total) by mouth daily. (Patient not taking: Reported on 07/21/2021) 30 tablet 5   No current facility-administered medications for this visit.    Allergies as of 07/21/2021 - Review Complete 07/21/2021  Allergen Reaction Noted   Atorvastatin  10/20/2010   Rosuvastatin  10/20/2010   Statins  06/23/2021   Zonisamide  05/12/2021    Vitals: BP 124/68   Pulse 80   Ht 5' 6"  (1.676 m)   Wt 125 lb 3.2 oz (56.8 kg)   BMI 20.21 kg/m  Last Weight:  Wt Readings from Last 1 Encounters:  07/21/21 125 lb 3.2 oz (56.8 kg)   Last Height:   Ht Readings from Last 1 Encounters:  07/21/21 5' 6"  (1.676 m)     Physical exam: stable Exam: Gen: Improved today does not appear in as much pain, +lacrimation, photophobia, conversant, well nourised,  well groomed                     CV: RRR, no MRG. No Carotid Bruits. No peripheral edema, warm, nontender Eyes: Conjunctivae injection with lacrimation right eye  Neuro: stable Detailed  Neurologic Exam  Speech:    Speech is normal; fluent and spontaneous with normal comprehension.  Cognition:    The patient is oriented to person, place, and time;     recent and remote memory intact;     language fluent;     normal attention, concentration,     fund of knowledge Cranial Nerves:    The pupils are equal, round, and reactive to light. Could not visualize fundi due to photophobia. Visual fields are full to threat. Extraocular movements are intact. Trigeminal sensation is intact and the muscles of mastication are normal. The face is symmetric. The palate elevates in the midline. Hearing intact. Voice is normal. Shoulder shrug is normal. The tongue has normal motion without fasciculations.   Coordination:    No dysmetria or ataxia  Gait:    Normal native gait  Motor Observation:    No asymmetry, no atrophy, and no involuntary movements noted. Tone:    Normal muscle tone.    Posture:    Posture is normal. normal erect    Strength:    Strength is V/V in the upper and lower limbs.      Sensation: intact to LT     Reflex Exam:  DTR's:    Deep tendon reflexes in the upper and lower extremities are symmetrical bilaterally.   Toes:    The toes are equiv bilaterally.   Clonus:    Clonus is absent.    Assessment/Plan:   80 y.o. male here as requested by [name], Evie Lacks, MD for headaches. PMHx ocular pemphigoid, B12 deficiency, HTN, PAC, DM2. New onset right temporal headache in the setting of pemphigoid Autoimmune eye disease), continuous, severe at times, no signifcant improvement with steroids. Unfortunately difficult to assess and  treat unclear etiology of headaches - associated with his pemphigoid eye disease or is this an independent headache syndrome with associated autonomic symptoms? MRI of the of the brain and carotid ultrasound were completed without etiology.   Better with verapamil and Emgality. Now on Verapamil 281m a day, 2nd Emgality injection today  cluster headache dosage. Appears to be responding to cluster headache treatment.   Nerve blocks, sphenopalatine blocks,Indomethacin, lidocaine nasal spray did not help. He had side effects to imitrex. He did not tolerate topamax but may have helped, neither tolerated zonegran. Botox did not help, nurtec did not help, he declined depakote, , high dose prednisone did not help significantly. has ptosis from last botox injections, will improve with time. I did send eyedrops to help with the ptosis which should resolved in a few weeks. oxygen 15 L high flow for acute events did not help, will cancel order  MRI of the brain was negative as were carotid dopplers.  Ordered CT of the orbits which showed swelling underneath the right eyelid suggestive of orbital cellulitis,finished Keflex 5044mbid start 7 days still swollen  Continue: Rituximab and continue to follow with [facility]. For ocular pemphigoid  I spent over 40  minutes of face-to-face and non-face-to-face time with patient on the  1. Episodic cluster headache, not intractable   2. Ocular pemphigoid    diagnosis.  This included previsit chart review, lab review, study review, order entry, electronic health record documentation, patient education on the different diagnostic and therapeutic options, counseling and coordination of care, risks and benefits of management, compliance, or risk factor reduction   PRIOR Assessment and plan:   I spoke to patient's [facility], we are going to try and get infusion here in the office. He also continues on Prednisone 6017mTemple pain/headache is 85% better but he still has pain supraorbitally however only at night or in the afternoon. But he is very improved. We discussed titrating the steroids and we will stay on 83m74mr one month then slowly decrease by 10mg51mthly. We discussed checking hgba1c and his glucose, I recommended he check his glucose daily, he is feeling well, no problems, his eyes  feel much better and are not as red or painful. We received some notes from Dr. PerezSharen Counter no orders, I have contacted him to get specifics on Rituximab including the pre-treatment, the injection doses and any labwork he wants prior to infusions. He is taking omeprazole daily, taking steroids with food as well, no GI upset.  Unclear etiology of headache but it has to be inflammatory/vascular or somehow related to the ocular pemphigoid, I doubt he would develop migraines or cluster headache at the age of 79 bu66trigeminal autonomic cephalalgias in the differential. Still we spoke about trying Depakote or other migraine medication(I do not think this is a migraine but it still may benefit from these medications). We decided to stay with the prednisone as it is helping after only 2 weeks and can consider other medications if needed (he is not enthusiastic about topamax or gabapentin, nurtec helped a little, wonder if qulipta would be helpful or depakote)  PRIOR Patient is on cellcept so I would think Temporal Arteritis(GCA) less likely however he has a new right temporal headache (he has no significant headache history) over the last 2 weeks with jaw symptoms and worsening vision. I am going to treat him for GCA and order esr/crp however not sure they will be reliable. Will order Ultra Sound of the  temporal arteries, if esr/crp elevated we can discuss a biopsy but may be elevated due to his current inflammatory eye disease. I have contacted his ocular immunologist Dr. Sharen Counter.   (Addendum: ESR 31(ULN 30), CRP 13(ULN 10), temporal artery ultrasound without evidence of halo sign)  Severiano Gilbert, MD ocular immunologist [phone]   No orders of the defined types were placed in this encounter.  Meds ordered this encounter  Medications   Galcanezumab-gnlm (EMGALITY) 120 MG/ML SOAJ    Sig: Inject 360 mg into the skin once for 1 dose.    Dispense:  3 mL    Refill:  0    Order Specific Question:   Lot  Number?    Answer:   [id number]    Order Specific Question:   Expiration Date?    Answer:   02/14/2023    Order Specific Question:   Quantity    Answer:   3    Comments:   pen    Cc: [name], Evie Lacks, MD,  Severiano Gilbert, MD ocular immunologist [phone]  Sarina Ill, MD  [facility] [address] [place], [place] [address]  Phone [phone] Fax [phone]   I spent 30 minutes of face-to-face and non-face-to-face time with patient on the  1. Episodic cluster headache, not intractable   2. Ocular pemphigoid     diagnosis.  This included previsit chart review, lab review, study review, order entry, electronic health record documentation, patient education on the different diagnostic and therapeutic options, counseling and coordination of care, risks and benefits of management, compliance, or risk factor reduction. This does not include time spent on nerve block.

## 2021-07-25 ENCOUNTER — Ambulatory Visit: Payer: Medicare Other | Admitting: Internal Medicine

## 2021-07-25 ENCOUNTER — Telehealth: Payer: Self-pay | Admitting: Neurology

## 2021-07-25 DIAGNOSIS — G44019 Episodic cluster headache, not intractable: Secondary | ICD-10-CM

## 2021-07-25 NOTE — Telephone Encounter (Signed)
Pt states an order needs to be submitted to have the equipment picked up since Dr Jaynee Eagles is having him come off of the oxygen.

## 2021-07-26 NOTE — Telephone Encounter (Signed)
Spoke with patient. Called [employer] and collected fax number for order. Order to d/c oxygen was signed by Dr Leta Baptist (Dr Jaynee Eagles out of office) and was faxed to [place] @ [phone]. Received a receipt of confirmation.

## 2021-07-26 NOTE — Addendum Note (Signed)
Addended by: Gildardo Griffes on: 07/26/2021 10:27 AM   Modules accepted: Orders

## 2021-07-26 NOTE — Telephone Encounter (Signed)
Order has been written and printed. Need covering MD's signature. I called the pt and LVM asking for call back to clarify which DME company he used. I believe its [employer] but will confirm.

## 2021-08-02 ENCOUNTER — Encounter: Payer: Self-pay | Admitting: Internal Medicine

## 2021-08-02 ENCOUNTER — Other Ambulatory Visit: Payer: Self-pay

## 2021-08-02 ENCOUNTER — Ambulatory Visit (INDEPENDENT_AMBULATORY_CARE_PROVIDER_SITE_OTHER): Payer: Medicare Other | Admitting: Internal Medicine

## 2021-08-02 DIAGNOSIS — L121 Cicatricial pemphigoid: Secondary | ICD-10-CM | POA: Diagnosis not present

## 2021-08-02 DIAGNOSIS — E538 Deficiency of other specified B group vitamins: Secondary | ICD-10-CM

## 2021-08-02 DIAGNOSIS — E559 Vitamin D deficiency, unspecified: Secondary | ICD-10-CM | POA: Diagnosis not present

## 2021-08-02 DIAGNOSIS — I10 Essential (primary) hypertension: Secondary | ICD-10-CM | POA: Diagnosis not present

## 2021-08-02 DIAGNOSIS — R634 Abnormal weight loss: Secondary | ICD-10-CM

## 2021-08-02 DIAGNOSIS — H11233 Symblepharon, bilateral: Secondary | ICD-10-CM | POA: Diagnosis not present

## 2021-08-02 DIAGNOSIS — E440 Moderate protein-calorie malnutrition: Secondary | ICD-10-CM | POA: Diagnosis not present

## 2021-08-02 DIAGNOSIS — E119 Type 2 diabetes mellitus without complications: Secondary | ICD-10-CM | POA: Diagnosis not present

## 2021-08-02 DIAGNOSIS — G44099 Other trigeminal autonomic cephalgias (TAC), not intractable: Secondary | ICD-10-CM | POA: Diagnosis not present

## 2021-08-02 DIAGNOSIS — L129 Pemphigoid, unspecified: Secondary | ICD-10-CM

## 2021-08-02 DIAGNOSIS — E785 Hyperlipidemia, unspecified: Secondary | ICD-10-CM | POA: Diagnosis not present

## 2021-08-02 DIAGNOSIS — H16143 Punctate keratitis, bilateral: Secondary | ICD-10-CM | POA: Diagnosis not present

## 2021-08-02 LAB — COMPREHENSIVE METABOLIC PANEL
ALT: 13 U/L (ref 0–53)
AST: 25 U/L (ref 0–37)
Albumin: 3.9 g/dL (ref 3.5–5.2)
Alkaline Phosphatase: 63 U/L (ref 39–117)
BUN: 14 mg/dL (ref 6–23)
CO2: 28 mEq/L (ref 19–32)
Calcium: 9.6 mg/dL (ref 8.4–10.5)
Chloride: 103 mEq/L (ref 96–112)
Creatinine, Ser: 1.31 mg/dL (ref 0.40–1.50)
GFR: 51.84 mL/min — ABNORMAL LOW (ref >60.00)
Glucose, Bld: 92 mg/dL (ref 70–99)
Potassium: 4 mEq/L (ref 3.5–5.1)
Sodium: 139 mEq/L (ref 135–145)
Total Bilirubin: 0.4 mg/dL (ref 0.2–1.2)
Total Protein: 6.5 g/dL (ref 6.0–8.3)

## 2021-08-02 LAB — HEMOGLOBIN A1C: Hgb A1c MFr Bld: 6 % (ref 4.6–6.5)

## 2021-08-02 LAB — CBC WITH DIFFERENTIAL/PLATELET
Basophils Absolute: 0 10*3/uL (ref 0.0–0.1)
Basophils Relative: 0.6 % (ref 0.0–3.0)
Eosinophils Absolute: 0.2 10*3/uL (ref 0.0–0.7)
Eosinophils Relative: 2.1 % (ref 0.0–5.0)
HCT: 36.3 % — ABNORMAL LOW (ref 39.0–52.0)
Hemoglobin: 11.8 g/dL — ABNORMAL LOW (ref 13.0–17.0)
Lymphocytes Relative: 49.7 % — ABNORMAL HIGH (ref 12.0–46.0)
Lymphs Abs: 3.9 10*3/uL (ref 0.7–4.0)
MCHC: 32.4 g/dL (ref 30.0–36.0)
MCV: 87.6 fl (ref 78.0–100.0)
Monocytes Absolute: 0.8 10*3/uL (ref 0.1–1.0)
Monocytes Relative: 10.5 % (ref 3.0–12.0)
Neutro Abs: 2.9 10*3/uL (ref 1.4–7.7)
Neutrophils Relative %: 37.1 % — ABNORMAL LOW (ref 43.0–77.0)
Platelets: 343 10*3/uL (ref 150.0–400.0)
RBC: 4.15 Mil/uL — ABNORMAL LOW (ref 4.22–5.81)
RDW: 14.4 % (ref 11.5–15.5)
WBC: 7.8 10*3/uL (ref 4.0–10.5)

## 2021-08-02 LAB — TSH: TSH: 0.9 u[IU]/mL (ref 0.35–5.50)

## 2021-08-02 MED ORDER — HYDROCODONE-ACETAMINOPHEN 7.5-325 MG PO TABS: 1.0000 | ORAL_TABLET | Freq: Four times a day (QID) | ORAL | 0 refills | Status: DC | PRN

## 2021-08-02 MED ORDER — FENOFIBRATE 160 MG PO TABS: 160.0000 mg | ORAL_TABLET | Freq: Every day | ORAL | 3 refills | Status: DC

## 2021-08-02 MED ORDER — VERAPAMIL HCL ER 240 MG PO TBCR: 240.0000 mg | EXTENDED_RELEASE_TABLET | Freq: Every day | ORAL | 3 refills | Status: DC

## 2021-08-02 MED ORDER — ESCITALOPRAM OXALATE 5 MG PO TABS: 5.0000 mg | ORAL_TABLET | Freq: Every day | ORAL | 1 refills | Status: DC

## 2021-08-02 MED ORDER — TAMSULOSIN HCL 0.4 MG PO CAPS: 0.4000 mg | ORAL_CAPSULE | Freq: Every day | ORAL | 3 refills | Status: DC

## 2021-08-02 MED ORDER — METFORMIN HCL 500 MG PO TABS: 500.0000 mg | ORAL_TABLET | Freq: Three times a day (TID) | ORAL | 1 refills | Status: DC

## 2021-08-02 NOTE — Assessment & Plan Note (Signed)
Use Ensure Megace

## 2021-08-02 NOTE — Assessment & Plan Note (Signed)
S/p cellcept and on the rituximab.

## 2021-08-02 NOTE — Assessment & Plan Note (Signed)
On Vit D 

## 2021-08-02 NOTE — Progress Notes (Signed)
Subjective:  Patient ID: Louis Kemp, male    DOB: 10-03-1941  Age: 80 y.o. MRN: [id number]  CC: No chief complaint on file.   HPI Louis Kemp presents for HA - 90% of the temple and 75% zigomatic pain on the R after Norco. Wt loss is better F/u DM, B12 def   Outpatient Medications Prior to Visit  Medication Sig Dispense Refill   apraclonidine (IOPIDINE) 0.5 % ophthalmic solution Place 1 drop into both eyes 2 (two) times daily as needed. For ptosis caused by botox. 5 mL 2   butalbital-acetaminophen-caffeine (FIORICET) 50-325-40 MG tablet Take 1-2 tablets by mouth every 6 (six) hours as needed for headache. (Patient not taking: Reported on 07/21/2021) 60 tablet 0   Cholecalciferol (VITAMIN D) 2000 UNITS CAPS Take 1 capsule by mouth every other day.     cyanocobalamin (,VITAMIN B-12,) 1000 MCG/ML injection Inject 1 mL (1,000 mcg total) into the muscle every 14 (fourteen) days. 30 mL 3   escitalopram (LEXAPRO) 5 MG tablet Take 1 tablet (5 mg total) by mouth at bedtime. 30 tablet 5   fenofibrate 160 MG tablet Take 1 tablet (160 mg total) by mouth daily. 90 tablet 3   HYDROcodone-acetaminophen (NORCO) 7.5-325 MG tablet Take 1-2 tablets by mouth every 4 (four) hours as needed for moderate pain. 60 tablet 0   lidocaine (LIDODERM) 5 % Place 1 patch onto the skin daily. Remove & Discard patch within 12 hours or as directed by MD (Patient not taking: Reported on 07/21/2021) 30 patch 11   megestrol (MEGACE) 40 MG tablet Take 1 tablet (40 mg total) by mouth daily. (Patient not taking: Reported on 07/21/2021) 30 tablet 5   metFORMIN (GLUCOPHAGE) 500 MG tablet TAKE 1 TABLET BY MOUTH 3 TIMES A DAY. 270 tablet 1   NONFORMULARY OR COMPOUNDED ITEM 4% Lidocaine nasal spray. Directions: Place 2 sprays into right nostril every 1 hour as needed for headache. 1 each 2   tamsulosin (FLOMAX) 0.4 MG CAPS capsule Take 1 capsule (0.4 mg total) by mouth daily. 90 capsule 3   verapamil (CALAN-SR) 240 MG CR tablet  Take 1 tablet (240 mg total) by mouth at bedtime. Please stop amlodipine. 30 tablet 6   No facility-administered medications prior to visit.    ROS: Review of Systems  Constitutional:  Positive for fatigue. Negative for appetite change and unexpected weight change.  HENT:  Negative for congestion, nosebleeds, sneezing, sore throat and trouble swallowing.   Eyes:  Positive for photophobia and visual disturbance. Negative for itching.  Respiratory:  Negative for cough.   Cardiovascular:  Negative for chest pain, palpitations and leg swelling.  Gastrointestinal:  Negative for abdominal distention, blood in stool, diarrhea and nausea.  Genitourinary:  Negative for frequency and hematuria.  Musculoskeletal:  Negative for back pain, gait problem, joint swelling and neck pain.  Skin:  Negative for rash.  Neurological:  Positive for weakness and headaches. Negative for dizziness, tremors and speech difficulty.  Psychiatric/Behavioral:  Negative for agitation, dysphoric mood, sleep disturbance and suicidal ideas. The patient is not nervous/anxious.    Objective:  BP (!) 142/72 (BP Location: Left Arm)   Pulse 69   Temp 98.1 F (36.7 C) (Oral)   Ht '5\' 6"'$  (1.676 m)   Wt 125 lb (56.7 kg)   SpO2 97%   BMI 20.18 kg/m   BP Readings from Last 3 Encounters:  08/02/21 (!) 142/72  07/21/21 124/68  06/23/21 132/72    Wt Readings from Last  3 Encounters:  08/02/21 125 lb (56.7 kg)  07/21/21 125 lb 3.2 oz (56.8 kg)  06/23/21 122 lb 9.6 oz (55.6 kg)    Physical Exam Constitutional:      General: He is not in acute distress.    Appearance: He is well-developed. He is not ill-appearing or toxic-appearing.     Comments: NAD  Eyes:     Conjunctiva/sclera: Conjunctivae normal.     Pupils: Pupils are equal, round, and reactive to light.  Neck:     Thyroid: No thyromegaly.     Vascular: No JVD.  Cardiovascular:     Rate and Rhythm: Normal rate and regular rhythm.     Heart sounds: Normal heart  sounds. No murmur heard.   No friction rub. No gallop.  Pulmonary:     Effort: Pulmonary effort is normal. No respiratory distress.     Breath sounds: Normal breath sounds. No wheezing or rales.  Chest:     Chest wall: No tenderness.  Abdominal:     General: Bowel sounds are normal. There is no distension.     Palpations: Abdomen is soft. There is no mass.     Tenderness: There is no abdominal tenderness. There is no guarding or rebound.  Musculoskeletal:        General: Tenderness present. Normal range of motion.     Cervical back: Normal range of motion.  Lymphadenopathy:     Cervical: No cervical adenopathy.  Skin:    General: Skin is warm and dry.     Findings: No rash.  Neurological:     Mental Status: He is alert and oriented to person, place, and time.     Cranial Nerves: No cranial nerve deficit.     Motor: No abnormal muscle tone.     Coordination: Coordination normal.     Gait: Gait normal.     Deep Tendon Reflexes: Reflexes are normal and symmetric.  Psychiatric:        Behavior: Behavior normal.        Thought Content: Thought content normal.        Judgment: Judgment normal.   Looks much better Thin Using a pole for blind  Lab Results  Component Value Date   WBC 8.9 05/12/2021   HGB 11.9 (L) 05/12/2021   HCT 35.9 (L) 05/12/2021   PLT 289.0 05/12/2021   GLUCOSE 204 (H) 05/12/2021   CHOL 160 11/10/2020   TRIG 139.0 11/10/2020   HDL 38.20 (L) 11/10/2020   LDLCALC 94 11/10/2020   ALT 20 05/12/2021   AST 17 05/12/2021   NA 139 05/12/2021   K 4.4 05/12/2021   CL 104 05/12/2021   CREATININE 1.30 05/12/2021   BUN 17 05/12/2021   CO2 29 05/12/2021   TSH 1.04 11/10/2020   PSA 0.63 11/10/2020   INR 1.01 09/03/2014   HGBA1C 8.1 (H) 05/12/2021   MICROALBUR 1.8 11/10/2020    CT ORBITS W CONTRAST  Result Date: 06/20/2021 CLINICAL DATA:  Uveitis with periorbital cellulitis EXAM: CT ORBITS WITH CONTRAST TECHNIQUE: Multidetector CT images was performed  according to the standard protocol following intravenous contrast administration. CONTRAST:  9m ISOVUE-300 IOPAMIDOL (ISOVUE-300) INJECTION 61% COMPARISON:  No pertinent prior exam. FINDINGS: Orbits: Bilateral cataract extraction. Normal globe bilaterally. No orbital mass edema or abscess. Mild soft tissue thickening of the right upper eyelid. Normal left eyelid. Visible paranasal sinuses: Mild mucosal edema left maxillary sinus. Remaining sinuses clear. Soft tissues: Mild soft tissue thickening right inferior eyelid. Otherwise no soft  tissue edema, mass, or fluid collection Osseous: Negative Limited intracranial: Negative IMPRESSION: Mild mucosal edema right inferior eyelid which may be due to periorbital cellulitis. No orbital mass or abscess. Electronically Signed   By: Franchot Gallo M.D.   On: 06/20/2021 15:15    Assessment & Plan:     Walker Kehr, MD

## 2021-08-02 NOTE — Assessment & Plan Note (Signed)
Occular pemphigoid - [facility] - Dr Sharen Counter 01/2020 On Cellcept. Remicade is considered. 5/21 C/o severe eye discomfort and photophobia due to his ophthalmic pemphigoid flare up. Using Aleve d/c On Cellcept since Feb 2021 15000 mg/d. Remicaid is considered Tramadol or Tylenol prn, steroid taper 11/21 Assessment: 1. Ocular cicatricial pemphigoid -referred from Dr. Lucita Ferrara, pt is retired ophthalmologist -symptoms of irritation and dryness after YAG capsulotomy in 09/2019 Dr. Bing Plume -had allergic reaction to restasis  -finished trial of doxycycline for 2 weeks -Conj biopsy 2/2: No diagnostic deposits were seen, only a strong linear band of fibrin at the basement mebrane -Additionally, Quantiferon TB Positive (history of BCG vaccination) Conjunctival Biopsy negative - Has been flaring while tapering steroids.   Today, off topical steroids. Quiet, doing well Tolerating soft contact lens. OD dystychiasis. Removed lashes.   Gtts: Albumin TID OU (out of drops) PF Refresh prn OU  Retaine prn OU  Cellcept 1500 mg TID  2.Symblepharon, bilateral #1  3. Monitoring long term medication Loss of appetite , Some loss of weight. Will get labs at home and decrease CellCept 2 gm  Plan: CPM Continue soft contact lens and repeat scleral lens fitting.  Decreased CellCept 2gm

## 2021-08-02 NOTE — Assessment & Plan Note (Signed)
On metformin  Prandin 1 mg 3 times - stopped Check A1c

## 2021-08-02 NOTE — Assessment & Plan Note (Signed)
On Tricor 

## 2021-08-02 NOTE — Assessment & Plan Note (Signed)
Better Try Megace Ensure

## 2021-08-02 NOTE — Assessment & Plan Note (Signed)
On Hytrin, Coreg, Losatan

## 2021-08-02 NOTE — Assessment & Plan Note (Signed)
Cont w/B12 injections

## 2021-08-02 NOTE — Assessment & Plan Note (Signed)
HA - 90% of the temple and 75% zigomatic pain on the R after Norco. Pt had accupuncture Norco prn  Potential benefits of a long term opioids use as well as potential risks (i.e. addiction risk, apnea etc) and complications (i.e. Somnolence, constipation and others) were explained to the patient and were aknowledged. Cont w/other meds

## 2021-08-03 DIAGNOSIS — H53413 Scotoma involving central area, bilateral: Secondary | ICD-10-CM | POA: Diagnosis not present

## 2021-08-16 DIAGNOSIS — H11233 Symblepharon, bilateral: Secondary | ICD-10-CM | POA: Diagnosis not present

## 2021-08-16 DIAGNOSIS — H16143 Punctate keratitis, bilateral: Secondary | ICD-10-CM | POA: Diagnosis not present

## 2021-08-18 ENCOUNTER — Other Ambulatory Visit (INDEPENDENT_AMBULATORY_CARE_PROVIDER_SITE_OTHER): Payer: Self-pay

## 2021-08-18 ENCOUNTER — Other Ambulatory Visit: Payer: Self-pay | Admitting: *Deleted

## 2021-08-18 DIAGNOSIS — L121 Cicatricial pemphigoid: Secondary | ICD-10-CM | POA: Diagnosis not present

## 2021-08-18 DIAGNOSIS — Z79899 Other long term (current) drug therapy: Secondary | ICD-10-CM

## 2021-08-18 DIAGNOSIS — Z0289 Encounter for other administrative examinations: Secondary | ICD-10-CM

## 2021-08-18 DIAGNOSIS — D72810 Lymphocytopenia: Secondary | ICD-10-CM | POA: Diagnosis not present

## 2021-08-18 DIAGNOSIS — R799 Abnormal finding of blood chemistry, unspecified: Secondary | ICD-10-CM | POA: Diagnosis not present

## 2021-08-18 DIAGNOSIS — M316 Other giant cell arteritis: Secondary | ICD-10-CM | POA: Diagnosis not present

## 2021-08-19 LAB — T + B-LYMPHOCYTE DIFFERENTIAL
% CD 3 Pos. Lymph.: 84.3 % (ref 57.5–86.2)
% CD 4 Pos. Lymph.: 22.5 % — ABNORMAL LOW (ref 30.8–58.5)
Absolute CD 3: 3541 /uL — ABNORMAL HIGH (ref 622–2402)
Absolute CD 4 Helper: 945 /uL (ref 359–1519)
Basophils Absolute: 0 10*3/uL (ref 0.0–0.2)
Basos: 1 %
CD19 % B Cell: 0.1 % — ABNORMAL LOW (ref 3.3–25.4)
CD19 Abs: 4 /uL — ABNORMAL LOW (ref 12–645)
CD4/CD8 Ratio: 0.36 — ABNORMAL LOW (ref 0.92–3.72)
CD8 % Suppressor T Cell: 62.7 % — ABNORMAL HIGH (ref 12.0–35.5)
CD8 T Cell Abs: 2633 /uL — ABNORMAL HIGH (ref 109–897)
EOS (ABSOLUTE): 0.1 10*3/uL (ref 0.0–0.4)
Eos: 2 %
Hematocrit: 38.8 % (ref 37.5–51.0)
Hemoglobin: 12.7 g/dL — ABNORMAL LOW (ref 13.0–17.7)
Immature Grans (Abs): 0 10*3/uL (ref 0.0–0.1)
Immature Granulocytes: 0 %
Lymphocytes Absolute: 4.2 10*3/uL — ABNORMAL HIGH (ref 0.7–3.1)
Lymphs: 50 %
MCH: 28 pg (ref 26.6–33.0)
MCHC: 32.7 g/dL (ref 31.5–35.7)
MCV: 86 fL (ref 79–97)
Monocytes Absolute: 0.5 10*3/uL (ref 0.1–0.9)
Monocytes: 7 %
Neutrophils Absolute: 3.3 10*3/uL (ref 1.4–7.0)
Neutrophils: 40 %
Platelets: 356 10*3/uL (ref 150–450)
RBC: 4.54 x10E6/uL (ref 4.14–5.80)
RDW: 13.9 % (ref 11.6–15.4)
WBC: 8.1 10*3/uL (ref 3.4–10.8)

## 2021-08-19 LAB — COMPREHENSIVE METABOLIC PANEL
ALT: 10 IU/L (ref 0–44)
AST: 27 IU/L (ref 0–40)
Albumin/Globulin Ratio: 1.9 (ref 1.2–2.2)
Albumin: 4.4 g/dL (ref 3.7–4.7)
Alkaline Phosphatase: 77 IU/L (ref 44–121)
BUN/Creatinine Ratio: 11 (ref 10–24)
BUN: 14 mg/dL (ref 8–27)
Bilirubin Total: 0.4 mg/dL (ref 0.0–1.2)
CO2: 21 mmol/L (ref 20–29)
Calcium: 9.9 mg/dL (ref 8.6–10.2)
Chloride: 102 mmol/L (ref 96–106)
Creatinine, Ser: 1.32 mg/dL — ABNORMAL HIGH (ref 0.76–1.27)
Globulin, Total: 2.3 g/dL (ref 1.5–4.5)
Glucose: 133 mg/dL — ABNORMAL HIGH (ref 65–99)
Potassium: 4.4 mmol/L (ref 3.5–5.2)
Sodium: 141 mmol/L (ref 134–144)
Total Protein: 6.7 g/dL (ref 6.0–8.5)
eGFR: 55 mL/min/{1.73_m2} — ABNORMAL LOW (ref >59)

## 2021-08-22 ENCOUNTER — Other Ambulatory Visit: Payer: Self-pay | Admitting: *Deleted

## 2021-08-22 MED ORDER — EMGALITY (300 MG DOSE) 100 MG/ML ~~LOC~~ SOSY: PREFILLED_SYRINGE | SUBCUTANEOUS | 11 refills | Status: DC

## 2021-08-22 NOTE — Progress Notes (Signed)
Emgality 300 mg order placed per v.o. Dr Jaynee Eagles to treat cluster headache cycle.

## 2021-08-23 ENCOUNTER — Telehealth: Payer: Self-pay

## 2021-08-23 NOTE — Telephone Encounter (Signed)
Approvedtoday Request Reference Number: [id number]. EMGALITY INJ '100MG'$ /ML is approved through 12/24/2021. Your patient may now fill this prescription and it will be covered.    Will inform patient through mychart

## 2021-08-23 NOTE — Telephone Encounter (Signed)
PA request for Emgality '120mg'$ /ml x 3 pens per 30 for episodic cluster headaches, has been submitted on CMM, Key: [id number] - PA Case ID: [id number].   Awaiting determination from [facility].

## 2021-08-30 ENCOUNTER — Telehealth: Payer: Self-pay | Admitting: Internal Medicine

## 2021-08-30 DIAGNOSIS — H53413 Scotoma involving central area, bilateral: Secondary | ICD-10-CM | POA: Diagnosis not present

## 2021-08-30 NOTE — Telephone Encounter (Signed)
N/A, Unable to leave a message for patient to call me back at [phone] to schedule Medicare Annual Wellness Visit   Last AWV  10/08/19  Please schedule at anytime with [facility] if patient calls the office back.    40 Minutes appointment   Any questions, please call me at [phone]

## 2021-09-01 ENCOUNTER — Other Ambulatory Visit: Payer: Medicare Other

## 2021-09-01 ENCOUNTER — Other Ambulatory Visit: Payer: Self-pay | Admitting: *Deleted

## 2021-09-01 DIAGNOSIS — Z79899 Other long term (current) drug therapy: Secondary | ICD-10-CM | POA: Diagnosis not present

## 2021-09-01 DIAGNOSIS — G44019 Episodic cluster headache, not intractable: Secondary | ICD-10-CM

## 2021-09-01 DIAGNOSIS — L121 Cicatricial pemphigoid: Secondary | ICD-10-CM | POA: Diagnosis not present

## 2021-09-02 LAB — T + B-LYMPHOCYTE DIFFERENTIAL
% CD 3 Pos. Lymph.: 86.9 % — ABNORMAL HIGH (ref 57.5–86.2)
% CD 4 Pos. Lymph.: 24.6 % — ABNORMAL LOW (ref 30.8–58.5)
Absolute CD 3: 2694 /uL — ABNORMAL HIGH (ref 622–2402)
Absolute CD 4 Helper: 763 /uL (ref 359–1519)
Basophils Absolute: 0 10*3/uL (ref 0.0–0.2)
Basos: 0 %
CD19 % B Cell: 0 % — ABNORMAL LOW (ref 3.3–25.4)
CD19 Abs: 0 /uL — ABNORMAL LOW (ref 12–645)
CD4/CD8 Ratio: 0.39 — ABNORMAL LOW (ref 0.92–3.72)
CD8 % Suppressor T Cell: 62.8 % — ABNORMAL HIGH (ref 12.0–35.5)
CD8 T Cell Abs: 1947 /uL — ABNORMAL HIGH (ref 109–897)
EOS (ABSOLUTE): 0.1 10*3/uL (ref 0.0–0.4)
Eos: 2 %
Hematocrit: 26.1 % — ABNORMAL LOW (ref 37.5–51.0)
Hemoglobin: 8.7 g/dL — ABNORMAL LOW (ref 13.0–17.7)
Immature Grans (Abs): 0 10*3/uL (ref 0.0–0.1)
Immature Granulocytes: 0 %
Lymphocytes Absolute: 3.1 10*3/uL (ref 0.7–3.1)
Lymphs: 56 %
MCH: 28.3 pg (ref 26.6–33.0)
MCHC: 33.3 g/dL (ref 31.5–35.7)
MCV: 85 fL (ref 79–97)
Monocytes Absolute: 0.5 10*3/uL (ref 0.1–0.9)
Monocytes: 8 %
Neutrophils Absolute: 1.9 10*3/uL (ref 1.4–7.0)
Neutrophils: 34 %
Platelets: 295 10*3/uL (ref 150–450)
RBC: 3.07 x10E6/uL — ABNORMAL LOW (ref 4.14–5.80)
RDW: 13.9 % (ref 11.6–15.4)
WBC: 5.6 10*3/uL (ref 3.4–10.8)

## 2021-09-02 LAB — COMPREHENSIVE METABOLIC PANEL
ALT: 9 IU/L (ref 0–44)
AST: 20 IU/L (ref 0–40)
Albumin/Globulin Ratio: 1.7 (ref 1.2–2.2)
Albumin: 3.8 g/dL (ref 3.7–4.7)
Alkaline Phosphatase: 84 IU/L (ref 44–121)
BUN/Creatinine Ratio: 14 (ref 10–24)
BUN: 17 mg/dL (ref 8–27)
Bilirubin Total: 0.2 mg/dL (ref 0.0–1.2)
CO2: 22 mmol/L (ref 20–29)
Calcium: 9.2 mg/dL (ref 8.6–10.2)
Chloride: 107 mmol/L — ABNORMAL HIGH (ref 96–106)
Creatinine, Ser: 1.23 mg/dL (ref 0.76–1.27)
Globulin, Total: 2.3 g/dL (ref 1.5–4.5)
Glucose: 102 mg/dL — ABNORMAL HIGH (ref 65–99)
Potassium: 4.6 mmol/L (ref 3.5–5.2)
Sodium: 142 mmol/L (ref 134–144)
Total Protein: 6.1 g/dL (ref 6.0–8.5)
eGFR: 60 mL/min/{1.73_m2} (ref >59)

## 2021-09-05 DIAGNOSIS — H04521 Eversion of right lacrimal punctum: Secondary | ICD-10-CM | POA: Diagnosis not present

## 2021-09-05 DIAGNOSIS — H02115 Cicatricial ectropion of left lower eyelid: Secondary | ICD-10-CM | POA: Diagnosis not present

## 2021-09-05 DIAGNOSIS — L121 Cicatricial pemphigoid: Secondary | ICD-10-CM | POA: Diagnosis not present

## 2021-09-05 DIAGNOSIS — H04563 Stenosis of bilateral lacrimal punctum: Secondary | ICD-10-CM | POA: Diagnosis not present

## 2021-09-05 DIAGNOSIS — H11823 Conjunctivochalasis, bilateral: Secondary | ICD-10-CM | POA: Diagnosis not present

## 2021-09-05 DIAGNOSIS — H02532 Eyelid retraction right lower eyelid: Secondary | ICD-10-CM | POA: Diagnosis not present

## 2021-09-05 DIAGNOSIS — H02132 Senile ectropion of right lower eyelid: Secondary | ICD-10-CM | POA: Diagnosis not present

## 2021-09-05 DIAGNOSIS — H02535 Eyelid retraction left lower eyelid: Secondary | ICD-10-CM | POA: Diagnosis not present

## 2021-09-05 DIAGNOSIS — H02112 Cicatricial ectropion of right lower eyelid: Secondary | ICD-10-CM | POA: Diagnosis not present

## 2021-09-05 DIAGNOSIS — H04523 Eversion of bilateral lacrimal punctum: Secondary | ICD-10-CM | POA: Diagnosis not present

## 2021-09-05 DIAGNOSIS — H018 Other specified inflammations of eyelid: Secondary | ICD-10-CM | POA: Diagnosis not present

## 2021-09-05 DIAGNOSIS — H11233 Symblepharon, bilateral: Secondary | ICD-10-CM | POA: Diagnosis not present

## 2021-09-05 DIAGNOSIS — H04522 Eversion of left lacrimal punctum: Secondary | ICD-10-CM | POA: Diagnosis not present

## 2021-09-05 DIAGNOSIS — H04213 Epiphora due to excess lacrimation, bilateral lacrimal glands: Secondary | ICD-10-CM | POA: Diagnosis not present

## 2021-09-05 DIAGNOSIS — H02011 Cicatricial entropion of right upper eyelid: Secondary | ICD-10-CM | POA: Diagnosis not present

## 2021-09-05 DIAGNOSIS — H02135 Senile ectropion of left lower eyelid: Secondary | ICD-10-CM | POA: Diagnosis not present

## 2021-09-05 DIAGNOSIS — H02051 Trichiasis without entropian right upper eyelid: Secondary | ICD-10-CM | POA: Diagnosis not present

## 2021-09-14 DIAGNOSIS — Z79899 Other long term (current) drug therapy: Secondary | ICD-10-CM | POA: Diagnosis not present

## 2021-09-14 DIAGNOSIS — H179 Unspecified corneal scar and opacity: Secondary | ICD-10-CM | POA: Diagnosis not present

## 2021-09-14 DIAGNOSIS — H11233 Symblepharon, bilateral: Secondary | ICD-10-CM | POA: Diagnosis not present

## 2021-09-14 DIAGNOSIS — H1089 Other conjunctivitis: Secondary | ICD-10-CM | POA: Diagnosis not present

## 2021-09-14 DIAGNOSIS — H16223 Keratoconjunctivitis sicca, not specified as Sjogren's, bilateral: Secondary | ICD-10-CM | POA: Diagnosis not present

## 2021-09-22 ENCOUNTER — Ambulatory Visit: Payer: Medicare Other | Admitting: Neurology

## 2021-09-25 ENCOUNTER — Other Ambulatory Visit: Payer: Self-pay | Admitting: Internal Medicine

## 2021-09-27 DIAGNOSIS — H179 Unspecified corneal scar and opacity: Secondary | ICD-10-CM | POA: Diagnosis not present

## 2021-09-27 DIAGNOSIS — H1789 Other corneal scars and opacities: Secondary | ICD-10-CM | POA: Diagnosis not present

## 2021-09-27 DIAGNOSIS — Z7984 Long term (current) use of oral hypoglycemic drugs: Secondary | ICD-10-CM | POA: Diagnosis not present

## 2021-09-27 DIAGNOSIS — E119 Type 2 diabetes mellitus without complications: Secondary | ICD-10-CM | POA: Diagnosis not present

## 2021-09-27 DIAGNOSIS — Z9181 History of falling: Secondary | ICD-10-CM | POA: Diagnosis not present

## 2021-09-27 DIAGNOSIS — I10 Essential (primary) hypertension: Secondary | ICD-10-CM | POA: Diagnosis not present

## 2021-10-03 ENCOUNTER — Encounter: Payer: Self-pay | Admitting: Internal Medicine

## 2021-10-03 ENCOUNTER — Ambulatory Visit (INDEPENDENT_AMBULATORY_CARE_PROVIDER_SITE_OTHER): Payer: Medicare Other | Admitting: Internal Medicine

## 2021-10-03 ENCOUNTER — Other Ambulatory Visit: Payer: Self-pay

## 2021-10-03 DIAGNOSIS — G44099 Other trigeminal autonomic cephalgias (TAC), not intractable: Secondary | ICD-10-CM | POA: Diagnosis not present

## 2021-10-03 DIAGNOSIS — E440 Moderate protein-calorie malnutrition: Secondary | ICD-10-CM | POA: Diagnosis not present

## 2021-10-03 DIAGNOSIS — L129 Pemphigoid, unspecified: Secondary | ICD-10-CM | POA: Diagnosis not present

## 2021-10-03 DIAGNOSIS — E538 Deficiency of other specified B group vitamins: Secondary | ICD-10-CM

## 2021-10-03 DIAGNOSIS — E119 Type 2 diabetes mellitus without complications: Secondary | ICD-10-CM

## 2021-10-03 DIAGNOSIS — F329 Major depressive disorder, single episode, unspecified: Secondary | ICD-10-CM

## 2021-10-03 DIAGNOSIS — H11233 Symblepharon, bilateral: Secondary | ICD-10-CM | POA: Diagnosis not present

## 2021-10-03 LAB — CBC WITH DIFFERENTIAL/PLATELET
Basophils Absolute: 0 10*3/uL (ref 0.0–0.1)
Basophils Relative: 0.2 % (ref 0.0–3.0)
Eosinophils Absolute: 0.1 10*3/uL (ref 0.0–0.7)
Eosinophils Relative: 1.8 % (ref 0.0–5.0)
HCT: 39.3 % (ref 39.0–52.0)
Hemoglobin: 12.9 g/dL — ABNORMAL LOW (ref 13.0–17.0)
Lymphocytes Relative: 45.4 % (ref 12.0–46.0)
Lymphs Abs: 3.5 10*3/uL (ref 0.7–4.0)
MCHC: 32.8 g/dL (ref 30.0–36.0)
MCV: 88.2 fl (ref 78.0–100.0)
Monocytes Absolute: 0.8 10*3/uL (ref 0.1–1.0)
Monocytes Relative: 10.7 % (ref 3.0–12.0)
Neutro Abs: 3.2 10*3/uL (ref 1.4–7.7)
Neutrophils Relative %: 41.9 % — ABNORMAL LOW (ref 43.0–77.0)
Platelets: 364 10*3/uL (ref 150.0–400.0)
RBC: 4.45 Mil/uL (ref 4.22–5.81)
RDW: 15.2 % (ref 11.5–15.5)
WBC: 7.6 10*3/uL (ref 4.0–10.5)

## 2021-10-03 LAB — COMPREHENSIVE METABOLIC PANEL
ALT: 14 U/L (ref 0–53)
AST: 23 U/L (ref 0–37)
Albumin: 4.2 g/dL (ref 3.5–5.2)
Alkaline Phosphatase: 58 U/L (ref 39–117)
BUN: 21 mg/dL (ref 6–23)
CO2: 32 mEq/L (ref 19–32)
Calcium: 10.4 mg/dL (ref 8.4–10.5)
Chloride: 100 mEq/L (ref 96–112)
Creatinine, Ser: 1.33 mg/dL (ref 0.40–1.50)
GFR: 50.84 mL/min — ABNORMAL LOW (ref >60.00)
Glucose, Bld: 103 mg/dL — ABNORMAL HIGH (ref 70–99)
Potassium: 4.1 mEq/L (ref 3.5–5.1)
Sodium: 139 mEq/L (ref 135–145)
Total Bilirubin: 0.4 mg/dL (ref 0.2–1.2)
Total Protein: 7 g/dL (ref 6.0–8.3)

## 2021-10-03 MED ORDER — HYDROCODONE-ACETAMINOPHEN 7.5-325 MG PO TABS: 1.0000 | ORAL_TABLET | Freq: Four times a day (QID) | ORAL | 0 refills | Status: DC | PRN

## 2021-10-03 NOTE — Assessment & Plan Note (Signed)
Start Megace to help wt

## 2021-10-03 NOTE — Assessment & Plan Note (Signed)
Cont w/B12 injections

## 2021-10-03 NOTE — Assessment & Plan Note (Signed)
Cont w/Lexapro 

## 2021-10-03 NOTE — Progress Notes (Signed)
`  Subjective:  Patient ID: Louis Kemp, male    DOB: 16-Jan-1941  Age: 80 y.o. MRN: [id number]  CC: Follow-up (2 month f/u)   HPI English Louis Kemp presents for wt loss, vision loss, depression, HA, DM  Outpatient Medications Prior to Visit  Medication Sig Dispense Refill   apraclonidine (IOPIDINE) 0.5 % ophthalmic solution Place 1 drop into both eyes 2 (two) times daily as needed. For ptosis caused by botox. 5 mL 2   Cholecalciferol (VITAMIN D) 2000 UNITS CAPS Take 1 capsule by mouth every other day.     cyanocobalamin (,VITAMIN B-12,) 1000 MCG/ML injection INJECT 1 ML INTO THE MUSCLE EVERY 14 DAYS 30 mL 3   escitalopram (LEXAPRO) 5 MG tablet Take 1 tablet (5 mg total) by mouth at bedtime. 90 tablet 1   fenofibrate 160 MG tablet Take 1 tablet (160 mg total) by mouth daily. 90 tablet 3   Galcanezumab-gnlm (EMGALITY, 300 MG DOSE,) 100 MG/ML SOSY Inject 300 mg (3 pens) into the skin every 30 days during cluster headache cycle and stop when cycle is over. 3 mL 11   lidocaine (LIDODERM) 5 % Place 1 patch onto the skin daily. Remove & Discard patch within 12 hours or as directed by MD 30 patch 11   megestrol (MEGACE) 40 MG tablet Take 1 tablet (40 mg total) by mouth daily. 30 tablet 5   metFORMIN (GLUCOPHAGE) 500 MG tablet Take 1 tablet (500 mg total) by mouth 3 (three) times daily. 270 tablet 1   NONFORMULARY OR COMPOUNDED ITEM 4% Lidocaine nasal spray. Directions: Place 2 sprays into right nostril every 1 hour as needed for headache. 1 each 2   tamsulosin (FLOMAX) 0.4 MG CAPS capsule Take 1 capsule (0.4 mg total) by mouth daily. 90 capsule 3   verapamil (CALAN-SR) 240 MG CR tablet Take 1 tablet (240 mg total) by mouth at bedtime. Please stop amlodipine. 90 tablet 3   HYDROcodone-acetaminophen (NORCO) 7.5-325 MG tablet Take 1-2 tablets by mouth every 6 (six) hours as needed for moderate pain. 120 tablet 0   No facility-administered medications prior to visit.    ROS: Review of Systems   Constitutional:  Positive for fatigue and unexpected weight change. Negative for appetite change.  HENT:  Negative for congestion, nosebleeds, sneezing, sore throat and trouble swallowing.   Eyes:  Positive for photophobia and visual disturbance. Negative for itching.  Respiratory:  Negative for cough.   Cardiovascular:  Negative for chest pain, palpitations and leg swelling.  Gastrointestinal:  Negative for abdominal distention, blood in stool, diarrhea and nausea.  Genitourinary:  Negative for frequency and hematuria.  Musculoskeletal:  Positive for gait problem. Negative for back pain, joint swelling and neck pain.  Skin:  Negative for rash.  Neurological:  Negative for dizziness, tremors, speech difficulty and weakness.  Psychiatric/Behavioral:  Positive for dysphoric mood. Negative for agitation, sleep disturbance and suicidal ideas. The patient is not nervous/anxious.    Objective:  BP 132/70 (BP Location: Left Arm)   Pulse 72   Temp 98.5 F (36.9 C) (Oral)   Ht 5\' 6"  (1.676 m)   Wt 124 lb 12.8 oz (56.6 kg)   SpO2 96%   BMI 20.14 kg/m   BP Readings from Last 3 Encounters:  10/03/21 132/70  08/02/21 (!) 142/72  07/21/21 124/68    Wt Readings from Last 3 Encounters:  10/03/21 124 lb 12.8 oz (56.6 kg)  08/02/21 125 lb (56.7 kg)  07/21/21 125 lb 3.2 oz (56.8 kg)  Physical Exam Constitutional:      General: He is not in acute distress.    Appearance: He is well-developed.     Comments: NAD  Eyes:     Conjunctiva/sclera: Conjunctivae normal.     Pupils: Pupils are equal, round, and reactive to light.  Neck:     Thyroid: No thyromegaly.     Vascular: No JVD.  Cardiovascular:     Rate and Rhythm: Normal rate and regular rhythm.     Heart sounds: Normal heart sounds. No murmur heard.   No friction rub. No gallop.  Pulmonary:     Effort: Pulmonary effort is normal. No respiratory distress.     Breath sounds: Normal breath sounds. No wheezing or rales.  Chest:      Chest wall: No tenderness.  Abdominal:     General: Bowel sounds are normal. There is no distension.     Palpations: Abdomen is soft. There is no mass.     Tenderness: There is no abdominal tenderness. There is no guarding or rebound.  Musculoskeletal:        General: Tenderness present. Normal range of motion.     Cervical back: Normal range of motion.  Lymphadenopathy:     Cervical: No cervical adenopathy.  Skin:    General: Skin is warm and dry.     Findings: No rash.  Neurological:     Mental Status: He is alert and oriented to person, place, and time.     Cranial Nerves: No cranial nerve deficit.     Motor: No abnormal muscle tone.     Coordination: Coordination abnormal.     Gait: Gait normal.     Deep Tendon Reflexes: Reflexes are normal and symmetric.  Psychiatric:        Behavior: Behavior normal.        Thought Content: Thought content normal.        Judgment: Judgment normal.  Visually impaired L eye - post-op  Lab Results  Component Value Date   WBC 5.6 09/01/2021   HGB 8.7 (L) 09/01/2021   HCT 26.1 (L) 09/01/2021   PLT 295 09/01/2021   GLUCOSE 102 (H) 09/01/2021   CHOL 160 11/10/2020   TRIG 139.0 11/10/2020   HDL 38.20 (L) 11/10/2020   LDLCALC 94 11/10/2020   ALT 9 09/01/2021   AST 20 09/01/2021   NA 142 09/01/2021   Louis 4.6 09/01/2021   CL 107 (H) 09/01/2021   CREATININE 1.23 09/01/2021   BUN 17 09/01/2021   CO2 22 09/01/2021   TSH 0.90 08/02/2021   PSA 0.63 11/10/2020   INR 1.01 09/03/2014   HGBA1C 6.0 08/02/2021   MICROALBUR 1.8 11/10/2020    CT ORBITS W CONTRAST  Result Date: 06/20/2021 CLINICAL DATA:  Uveitis with periorbital cellulitis EXAM: CT ORBITS WITH CONTRAST TECHNIQUE: Multidetector CT images was performed according to the standard protocol following intravenous contrast administration. CONTRAST:  7mL ISOVUE-300 IOPAMIDOL (ISOVUE-300) INJECTION 61% COMPARISON:  No pertinent prior exam. FINDINGS: Orbits: Bilateral cataract extraction.  Normal globe bilaterally. No orbital mass edema or abscess. Mild soft tissue thickening of the right upper eyelid. Normal left eyelid. Visible paranasal sinuses: Mild mucosal edema left maxillary sinus. Remaining sinuses clear. Soft tissues: Mild soft tissue thickening right inferior eyelid. Otherwise no soft tissue edema, mass, or fluid collection Osseous: Negative Limited intracranial: Negative IMPRESSION: Mild mucosal edema right inferior eyelid which may be due to periorbital cellulitis. No orbital mass or abscess. Electronically Signed   By: Juanda Crumble  Carlis Abbott M.D.   On: 06/20/2021 15:15    Assessment & Plan:   Problem List Items Addressed This Visit     B12 deficiency    Cont w/B12 injections      Depression    Cont w/Lexapro      Diabetes type 2, controlled ([place])    Last A1c was ok      Malnutrition ([place])    Start Megace to help wt      Relevant Orders   Comprehensive metabolic panel   CBC with Differential/Platelet   Pemphigoid    S/p amniotic cell treatment - L eye      Relevant Orders   CBC with Differential/Platelet   Trigeminal autonomic cephalgias    Better - Norco Rx renewed       Relevant Medications   HYDROcodone-acetaminophen (NORCO) 7.5-325 MG tablet      Follow-up: No follow-ups on file.  Louis Kehr, MD

## 2021-10-03 NOTE — Assessment & Plan Note (Signed)
Better - Norco Rx renewed

## 2021-10-03 NOTE — Addendum Note (Signed)
Addended by: Jacobo Forest on: 10/03/2021 02:43 PM   Modules accepted: Orders

## 2021-10-03 NOTE — Assessment & Plan Note (Signed)
Last A1c was ok

## 2021-10-03 NOTE — Assessment & Plan Note (Signed)
S/p amniotic cell treatment - L eye

## 2021-10-05 DIAGNOSIS — H179 Unspecified corneal scar and opacity: Secondary | ICD-10-CM | POA: Diagnosis not present

## 2021-10-10 ENCOUNTER — Encounter: Payer: Self-pay | Admitting: Neurology

## 2021-10-10 ENCOUNTER — Ambulatory Visit (INDEPENDENT_AMBULATORY_CARE_PROVIDER_SITE_OTHER): Payer: Self-pay | Admitting: Neurology

## 2021-10-10 DIAGNOSIS — G44019 Episodic cluster headache, not intractable: Secondary | ICD-10-CM

## 2021-10-10 NOTE — Progress Notes (Signed)
[facility]    Provider:  Dr Jaynee Eagles Requesting Provider: Alain Marion, Evie Lacks, MD Primary Care Provider:  Cassandria Anger, MD  CC:  Right temporal hedache  Virtual Visit via Telephone Note  I connected with Gay Filler on 10/10/21 at  2:00 PM EDT by telephone and verified that I am speaking with the correct person using two identifiers.  Location: Patient: home Provider: office   I discussed the limitations, risks, security and privacy concerns of performing an evaluation and management service by telephone and the availability of in person appointments. I also discussed with the patient that there may be a patient responsible charge related to this service. The patient expressed understanding and agreed to proceed.   Follow Up Instructions:    I discussed the assessment and treatment plan with the patient. The patient was provided an opportunity to ask questions and all were answered. The patient agreed with the plan and demonstrated an understanding of the instructions.   The patient was advised to call back or seek an in-person evaluation if the symptoms worsen or if the condition fails to improve as anticipated.  I provided 2 minutes of non-face-to-face time during this encounter.   Melvenia Beam, MD   10/10/2021: He is doing great, just a few headaches, doing well on verapamil and emgality, he had some corneal scarring and he had aprocedure which is helping. Follow up phone visit in 3 months  Interval history: Patient her etoday, he is feeling much better. We have him on verapamil and have given him Emaglity cluster headache dose (today will be the second dose). Lidocaine spray, oxygen, nerve block, sphenopalatine blocks did not last.   Interval history: Patient here for intractable episodic cluster headaches in the setting of very rare ocular pemphigoid.  He has intractable periorbital pain in the right with unilateral lacrimation and  rhinorrhea.  Initially thought this might be a secondary headache from the very rare ocular pemphigoid, but at this time it appears he has concomitant cluster headaches; the rarity of ocular pemphigoid in addition with cluster headaches is quite unusual.  Today he has ptosis, likely from botox. Botox x 2 has not helped.  We have tried oral triptans, Nurtec, indomethacin, and none have relieved his symptoms.  Indomethacin was titrated up to 150 mg a day does not appear to be an indomethacin responsive headache but can try going up to 225 mg a day.  We have tried him on Topamax, Zonegran, high-dose steroids for over a month.  We have discontinued his amlodipine and carvedilol and we started verapamil.  Today we also injected him with Emgality.  I performed nerve blocks as well as sphenopalatine ganglion block.  Our plan is to do the following:  Interval history: The patient describes a headache primarily as a constant boring pain.  Certainly, the possibility of hemicrania continua does need to be considered or Cluster headache.  A trial indomethacin(or verapamil or Emgality) may be of some diagnostic benefit. He did not tolerate topamax but may have helped, neither tolerated zonegran, we have tried botox and will repeat, he declines depakote. Will titrate him off of prenisone and try Indomethacin. If no benefit will try Emgality or Verapamil.   Physially he is better, more energy, still having some redness of the right eye but maybe slightly improved, vision has not improved, Stopped the cellcept and on the rituximab.   Interval history: The pain is on the right temple, it happens in the afternoon or  overnight about the same time every day, right eye waters(both eyes water), other wise headache wakes him up if he does not take medication, his blood pressure has been a little low he was feeling weak and it was 325 systolic. Taking the amlodipine at 6pm, taking the carvedilol 6-7a, 10pm. BP 108/77 and tachycardic.  He took gatorade yesterday and he was better but this morning again his BP was a little low. We decided to do nerve blocks today. We discussed topamax, CGRP meds, unfrotunately difficult to assess and treat unclear etiology of headaches - associated with his pemphigoid eye disease or is this an independent headache syndrome with associated autonomic symptoms? If no improvement with rituximab this Thursday we will change course.   Interval history 01/24/2021: I spoke to patient's [place] physician, we are going to try and get infusion here in the office. He also continues on Prednisone. Temple pain/headache is 85% better but he still has pain supraorbitally. Only at night or in the afternoon. But he is very improved. We discussed titrating the steroids and we will stay on 61m for one month then slowly decrease by 186mmonthly. We discussed checking hgba1c and his glucose, I recommended he check his glucose daily, he is feeling well, no problems, his eyes feel much better and are not as red or painful. We received some notes from Dr. PeSharen Counterbut no orders, I have contacted him to get specifics on the pre-treatment, the injection doses and any labwork he wants prior to infusions. He is taking omeprazole daily, taking steroids with food as well, no GI upset.   HPI:  Louis Kemp a [age].o. male here as requested by [name], AlEvie LacksMD for headaches. PMHx ocular pemphigoid, B12 deficiency, HTN, PAC, DM2. New onset right temporal headache, continuous, sever at times.  Headaches started 2 weeks ago, it wasn't that bad but it is worsening and now can be severe, it is in the temple and radiates to the temple and frontal area. It becomes quite severe 7-8/10. Sharp pain, shooting pain, he had prior headache in the left eye which was not similar it was ocular pain his eye was so inflamed due to the Pemphigoid. This is not the same, this is new, and is not anything he has ever experienced. He has noticed in the last  4-5 days when he eats he has jaw pain. The right eye vision is worsening. No significant history of migraines. No other significant history of headaches, he had one migraine in the remote past and this is nothing like it. The pain is continuous, no known triggers. Alleve is not working. Had a long discussion with patient regarding possiibilites and differential. No other focal neurologic deficits, associated symptoms, inciting events or modifiable factors.  Reviewed notes, labs and imaging from outside physicians, which showed:  Personally reviewed imaging with Dr. PrMamie Nickethi(Neuroradiology) and agree with the following:mild T2 hyperintensities, mild cerebral atrophy, otherwise unremarkable for age.   11/10/2020: TSH nml, B12 1056, cbc unremarkable, CMP elevated glucose and reduced GFR 52 otherwise normal Review of Systems: Patient complains of symptoms per HPI as well as the following symptoms: headache, vision changes . Pertinent negatives and positives per HPI. All others negative   Social History   Socioeconomic History   Marital status: Married    Spouse name: [name]   Number of children: Not on file   Years of education: Not on file   Highest education level: Not on file  Occupational History  Occupation: MD Retired  Tobacco Use   Smoking status: Former    Types: Cigars   Smokeless tobacco: Never   Tobacco comments:    none in 8 years  Vaping Use   Vaping Use: Never used  Substance and Sexual Activity   Alcohol use: Yes    Comment: maybe 1 mixed drink every 3 weeks or so   Drug use: No    Comment: 3-4 cigars   year   Sexual activity: Not Currently  Other Topics Concern   Not on file  Social History Narrative   Regular Exercise -  YES, golf   Lives at home with wife    Right handed   Caffeine:  1-2 cups/day   Social Determinants of Health   Financial Resource Strain: Not on file  Food Insecurity: Not on file  Transportation Needs: Not on file  Physical Activity:  Not on file  Stress: Not on file  Social Connections: Not on file  Intimate Partner Violence: Not on file    Family History  Problem Relation Age of Onset   Arthritis Mother    Hypertension Father    Coronary artery disease Father    Hypertension Brother    Hypertension Other    Hypertension Brother    Coronary artery disease Other     Past Medical History:  Diagnosis Date   BPH (benign prostatic hypertrophy)    Hyperlipidemia    Hypertension    LBP (low back pain)    MVP (mitral valve prolapse)    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   on cellcept   Osteoarthritis    PAC (premature atrial contraction)    Statin intolerance    Type II or unspecified type diabetes mellitus without mention of complication, not stated as uncontrolled 2011   Vitamin B 12 deficiency 2011   Vitamin D deficiency    Vitiligo     Patient Active Problem List   Diagnosis Date Noted   Malnutrition ([place]) 06/23/2021   Episodic cluster headache, not intractable 06/09/2021   Ocular pemphigoid 05/19/2021   Chronic sinusitis 05/12/2021   Depression 05/12/2021   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   Right sided temporal headache 01/06/2021   Chronic renal insufficiency, stage 3 (moderate) ([place]) 05/11/2020   Pemphigoid 05/10/2020   Elevated serum creatinine 07/11/2018   Hip pain, acute, right 01/10/2018   Postoperative anemia due to acute blood loss 09/30/2014   Status post left partial knee replacement 09/15/2014   S/P left UKR 09/14/2014   S/P right TKA 06/15/2014   Osteoarthritis, knee 11/24/2013   Weight loss 06/24/2013   PAC (premature atrial contraction) 01/13/2013   Heart murmur 01/13/2013   Well adult exam 11/03/2012   Paresthesia 10/22/2012   Hypertension 04/18/2012   Diabetes type 2, controlled ([place]) 10/20/2010   B12 deficiency 10/20/2010   Vitamin D deficiency 10/20/2010   Dyslipidemia 07/14/2010   BENIGN PROSTATIC HYPERTROPHY 07/14/2010   OSTEOARTHRITIS 07/14/2010    LOW BACK PAIN 07/14/2010   HYPERGLYCEMIA 07/14/2010    Past Surgical History:  Procedure Laterality Date   arm surgery Left    ORIF left  forearm   CATARACT EXTRACTION  2012   EYE SURGERY     HERNIA REPAIR Bilateral 1971   PARTIAL KNEE ARTHROPLASTY Left 09/14/2014   Procedure: LEFT UNICOMPARTMENTAL KNEE ARTHROPLASTY MEDIALLY;  Surgeon: Mauri Pole, MD;  Location: [facility];  Service: Orthopedics;  Laterality: Left;   TOTAL KNEE ARTHROPLASTY Right 06/15/2014   Procedure: RIGHT TOTAL KNEE ARTHROPLASTY;  Surgeon: Mauri Pole, MD;  Location: [facility];  Service: Orthopedics;  Laterality: Right;    Current Outpatient Medications  Medication Sig Dispense Refill   apraclonidine (IOPIDINE) 0.5 % ophthalmic solution Place 1 drop into both eyes 2 (two) times daily as needed. For ptosis caused by botox. 5 mL 2   Cholecalciferol (VITAMIN D) 2000 UNITS CAPS Take 1 capsule by mouth every other day.     cyanocobalamin (,VITAMIN B-12,) 1000 MCG/ML injection INJECT 1 ML INTO THE MUSCLE EVERY 14 DAYS 30 mL 3   escitalopram (LEXAPRO) 5 MG tablet Take 1 tablet (5 mg total) by mouth at bedtime. 90 tablet 1   fenofibrate 160 MG tablet Take 1 tablet (160 mg total) by mouth daily. 90 tablet 3   Galcanezumab-gnlm (EMGALITY, 300 MG DOSE,) 100 MG/ML SOSY Inject 300 mg (3 pens) into the skin every 30 days during cluster headache cycle and stop when cycle is over. 3 mL 11   HYDROcodone-acetaminophen (NORCO) 7.5-325 MG tablet Take 1-2 tablets by mouth every 6 (six) hours as needed for moderate pain. 100 tablet 0   lidocaine (LIDODERM) 5 % Place 1 patch onto the skin daily. Remove & Discard patch within 12 hours or as directed by MD 30 patch 11   megestrol (MEGACE) 40 MG tablet Take 1 tablet (40 mg total) by mouth daily. 30 tablet 5   metFORMIN (GLUCOPHAGE) 500 MG tablet Take 1 tablet (500 mg total) by mouth 3 (three) times daily. 270 tablet 1   NONFORMULARY OR COMPOUNDED ITEM 4% Lidocaine nasal spray. Directions:  Place 2 sprays into right nostril every 1 hour as needed for headache. 1 each 2   tamsulosin (FLOMAX) 0.4 MG CAPS capsule Take 1 capsule (0.4 mg total) by mouth daily. 90 capsule 3   verapamil (CALAN-SR) 240 MG CR tablet Take 1 tablet (240 mg total) by mouth at bedtime. Please stop amlodipine. 90 tablet 3   No current facility-administered medications for this visit.    Allergies as of 10/10/2021 - Review Complete 10/03/2021  Allergen Reaction Noted   Statins  06/23/2021   Atorvastatin  10/20/2010   Rosuvastatin  10/20/2010   Zonisamide  05/12/2021    Vitals: There were no vitals taken for this visit. Last Weight:  Wt Readings from Last 1 Encounters:  10/03/21 124 lb 12.8 oz (56.6 kg)   Last Height:   Ht Readings from Last 1 Encounters:  10/03/21 5' 6"  (1.676 m)     Physical exam: stable Exam: Gen: Improved today does not appear in as much pain, +lacrimation, photophobia, conversant, well nourised,  well groomed                     CV: RRR, no MRG. No Carotid Bruits. No peripheral edema, warm, nontender Eyes: Conjunctivae injection with lacrimation right eye  Neuro: stable Detailed Neurologic Exam  Speech:    Speech is normal; fluent and spontaneous with normal comprehension.  Cognition:    The patient is oriented to person, place, and time;     recent and remote memory intact;     language fluent;     normal attention, concentration,     fund of knowledge Cranial Nerves:    The pupils are equal, round, and reactive to light. Could not visualize fundi due to photophobia. Visual fields are full to threat. Extraocular movements are intact. Trigeminal sensation is intact and the muscles of mastication are normal. The face is symmetric. The palate elevates in the midline.  Hearing intact. Voice is normal. Shoulder shrug is normal. The tongue has normal motion without fasciculations.   Coordination:    No dysmetria or ataxia  Gait:    Normal native gait  Motor  Observation:    No asymmetry, no atrophy, and no involuntary movements noted. Tone:    Normal muscle tone.    Posture:    Posture is normal. normal erect    Strength:    Strength is V/V in the upper and lower limbs.      Sensation: intact to LT     Reflex Exam:  DTR's:    Deep tendon reflexes in the upper and lower extremities are symmetrical bilaterally.   Toes:    The toes are equiv bilaterally.   Clonus:    Clonus is absent.    Assessment/Plan:   80 y.o. male here as requested by [name], Evie Lacks, MD for headaches. PMHx ocular pemphigoid, B12 deficiency, HTN, PAC, DM2. New onset right temporal headache in the setting of pemphigoid Autoimmune eye disease), continuous, severe at times, no signifcant improvement with steroids. Unfortunately difficult to assess and treat unclear etiology of headaches - associated with his pemphigoid eye disease or is this an independent headache syndrome with associated autonomic symptoms? MRI of the of the brain and carotid ultrasound were completed without etiology.   Better with verapamil and Emgality. Now on Verapamil 360 a month cluster headache dosage. Appears to be responding to cluster headache treatment excellent.  Nerve blocks, sphenopalatine blocks,Indomethacin, lidocaine nasal spray did not help. He had side effects to imitrex. He did not tolerate topamax but may have helped, neither tolerated zonegran. Botox did not help, nurtec did not help, he declined depakote, , high dose prednisone did not help significantly. has ptosis from last botox injections, will improve with time. I did send eyedrops to help with the ptosis which should resolved in a few weeks. oxygen 15 L high flow for acute events did not help, will cancel order  MRI of the brain was negative as were carotid dopplers.  Ordered CT of the orbits which showed swelling underneath the right eyelid suggestive of orbital cellulitis,finished Keflex 556m bid start 7 days still  swollen  Continue: Rituximab and continue to [facility]. For ocular pemphigoid  I spent over 40  minutes of face-to-face and non-face-to-face time with patient on the  1. Episodic cluster headache, not intractable     diagnosis.  This included previsit chart review, lab review, study review, order entry, electronic health record documentation, patient education on the different diagnostic and therapeutic options, counseling and coordination of care, risks and benefits of management, compliance, or risk factor reduction   PRIOR Assessment and plan:   I spoke to patient's DCeylonphysician, we are going to try and get infusion here in the office. He also continues on Prednisone 674m Temple pain/headache is 85% better but he still has pain supraorbitally however only at night or in the afternoon. But he is very improved. We discussed titrating the steroids and we will stay on 6028mor one month then slowly decrease by 71m14mnthly. We discussed checking hgba1c and his glucose, I recommended he check his glucose daily, he is feeling well, no problems, his eyes feel much better and are not as red or painful. We received some notes from Dr. PereSharen Countert no orders, I have contacted him to get specifics on Rituximab including the pre-treatment, the injection doses and any labwork he wants prior to infusions. He  is taking omeprazole daily, taking steroids with food as well, no GI upset.  Unclear etiology of headache but it has to be inflammatory/vascular or somehow related to the ocular pemphigoid, I doubt he would develop migraines or cluster headache at the age of 76 but trigeminal autonomic cephalalgias in the differential. Still we spoke about trying Depakote or other migraine medication(I do not think this is a migraine but it still may benefit from these medications). We decided to stay with the prednisone as it is helping after only 2 weeks and can consider other medications if needed (he  is not enthusiastic about topamax or gabapentin, nurtec helped a little, wonder if qulipta would be helpful or depakote)  PRIOR Patient is on cellcept so I would think Temporal Arteritis(GCA) less likely however he has a new right temporal headache (he has no significant headache history) over the last 2 weeks with jaw symptoms and worsening vision. I am going to treat him for GCA and order esr/crp however not sure they will be reliable. Will order Ultra Sound of the temporal arteries, if esr/crp elevated we can discuss a biopsy but may be elevated due to his current inflammatory eye disease. I have contacted his ocular immunologist Dr. Sharen Counter.   (Addendum: ESR 31(ULN 30), CRP 13(ULN 10), temporal artery ultrasound without evidence of halo sign)  Severiano Gilbert, MD ocular immunologist [phone]   No orders of the defined types were placed in this encounter.  No orders of the defined types were placed in this encounter.   Cc: [name], Evie Lacks, MD,  Severiano Gilbert, MD ocular immunologist [phone]  Sarina Ill, MD  [facility] [address] [place], [place] [address]  Phone [phone] Fax [phone]   I spent 30 minutes of face-to-face and non-face-to-face time with patient on the  1. Episodic cluster headache, not intractable      diagnosis.  This included previsit chart review, lab review, study review, order entry, electronic health record documentation, patient education on the different diagnostic and therapeutic options, counseling and coordination of care, risks and benefits of management, compliance, or risk factor reduction. This does not include time spent on nerve block.

## 2021-10-11 ENCOUNTER — Other Ambulatory Visit: Payer: Self-pay

## 2021-10-11 DIAGNOSIS — H02051 Trichiasis without entropian right upper eyelid: Secondary | ICD-10-CM | POA: Diagnosis not present

## 2021-10-11 DIAGNOSIS — H11233 Symblepharon, bilateral: Secondary | ICD-10-CM | POA: Diagnosis not present

## 2021-10-11 DIAGNOSIS — H16422 Pannus (corneal), left eye: Secondary | ICD-10-CM | POA: Diagnosis not present

## 2021-10-11 DIAGNOSIS — H02115 Cicatricial ectropion of left lower eyelid: Secondary | ICD-10-CM | POA: Diagnosis not present

## 2021-10-25 ENCOUNTER — Telehealth: Payer: Self-pay | Admitting: Neurology

## 2021-10-25 NOTE — Telephone Encounter (Signed)
They will come at 11 am tomorrow.

## 2021-10-25 NOTE — Telephone Encounter (Signed)
Pt would like a call from the nurse.   Pt would not elaborate on the reason needing a call from the nurse.

## 2021-10-25 NOTE — Telephone Encounter (Signed)
See email--

## 2021-10-26 ENCOUNTER — Other Ambulatory Visit: Payer: Self-pay

## 2021-10-26 ENCOUNTER — Ambulatory Visit (INDEPENDENT_AMBULATORY_CARE_PROVIDER_SITE_OTHER): Payer: Medicare Other | Admitting: *Deleted

## 2021-10-26 DIAGNOSIS — G44019 Episodic cluster headache, not intractable: Secondary | ICD-10-CM | POA: Diagnosis not present

## 2021-10-26 NOTE — Progress Notes (Signed)
Pt and wife here for emgality injections instruction.  Wife to give.  They brought there own medication. I instructed both of how to give the medication. They also have written instructions   I showed them how to give the first injection into RU abdomen subq.  Wife followed with 2 others in the LU and LL abdomen subq. Did well.  Pt tolerated well.  LOT [id number] H exp 04-28-23.

## 2021-11-22 DIAGNOSIS — H11233 Symblepharon, bilateral: Secondary | ICD-10-CM | POA: Diagnosis not present

## 2021-11-22 DIAGNOSIS — H16423 Pannus (corneal), bilateral: Secondary | ICD-10-CM | POA: Diagnosis not present

## 2021-11-22 DIAGNOSIS — Z947 Corneal transplant status: Secondary | ICD-10-CM | POA: Diagnosis not present

## 2021-11-22 DIAGNOSIS — H02115 Cicatricial ectropion of left lower eyelid: Secondary | ICD-10-CM | POA: Diagnosis not present

## 2021-11-28 DIAGNOSIS — H02132 Senile ectropion of right lower eyelid: Secondary | ICD-10-CM | POA: Diagnosis not present

## 2021-11-28 DIAGNOSIS — H16211 Exposure keratoconjunctivitis, right eye: Secondary | ICD-10-CM | POA: Diagnosis not present

## 2021-11-28 DIAGNOSIS — H02535 Eyelid retraction left lower eyelid: Secondary | ICD-10-CM | POA: Diagnosis not present

## 2021-11-28 DIAGNOSIS — H16212 Exposure keratoconjunctivitis, left eye: Secondary | ICD-10-CM | POA: Diagnosis not present

## 2021-11-28 DIAGNOSIS — H02115 Cicatricial ectropion of left lower eyelid: Secondary | ICD-10-CM | POA: Diagnosis not present

## 2021-11-28 DIAGNOSIS — H02125 Mechanical ectropion of left lower eyelid: Secondary | ICD-10-CM | POA: Diagnosis not present

## 2021-11-28 DIAGNOSIS — H16213 Exposure keratoconjunctivitis, bilateral: Secondary | ICD-10-CM | POA: Diagnosis not present

## 2021-11-28 DIAGNOSIS — L121 Cicatricial pemphigoid: Secondary | ICD-10-CM | POA: Diagnosis not present

## 2021-11-28 DIAGNOSIS — H11232 Symblepharon, left eye: Secondary | ICD-10-CM | POA: Diagnosis not present

## 2021-11-28 DIAGNOSIS — H11822 Conjunctivochalasis, left eye: Secondary | ICD-10-CM | POA: Diagnosis not present

## 2021-11-28 DIAGNOSIS — H02145 Spastic ectropion of left lower eyelid: Secondary | ICD-10-CM | POA: Diagnosis not present

## 2021-11-28 DIAGNOSIS — H02135 Senile ectropion of left lower eyelid: Secondary | ICD-10-CM | POA: Diagnosis not present

## 2021-11-28 DIAGNOSIS — H02011 Cicatricial entropion of right upper eyelid: Secondary | ICD-10-CM | POA: Diagnosis not present

## 2021-11-28 DIAGNOSIS — H04562 Stenosis of left lacrimal punctum: Secondary | ICD-10-CM | POA: Diagnosis not present

## 2021-11-28 DIAGNOSIS — H04521 Eversion of right lacrimal punctum: Secondary | ICD-10-CM | POA: Diagnosis not present

## 2021-11-28 DIAGNOSIS — H02142 Spastic ectropion of right lower eyelid: Secondary | ICD-10-CM | POA: Diagnosis not present

## 2021-12-20 DIAGNOSIS — H16103 Unspecified superficial keratitis, bilateral: Secondary | ICD-10-CM | POA: Diagnosis not present

## 2021-12-20 DIAGNOSIS — H11233 Symblepharon, bilateral: Secondary | ICD-10-CM | POA: Diagnosis not present

## 2021-12-20 DIAGNOSIS — Z947 Corneal transplant status: Secondary | ICD-10-CM | POA: Diagnosis not present

## 2021-12-20 DIAGNOSIS — H16423 Pannus (corneal), bilateral: Secondary | ICD-10-CM | POA: Diagnosis not present

## 2022-02-02 ENCOUNTER — Ambulatory Visit: Payer: Medicare Other | Admitting: Internal Medicine

## 2022-02-06 DIAGNOSIS — H02115 Cicatricial ectropion of left lower eyelid: Secondary | ICD-10-CM | POA: Diagnosis not present

## 2022-02-06 DIAGNOSIS — Z09 Encounter for follow-up examination after completed treatment for conditions other than malignant neoplasm: Secondary | ICD-10-CM | POA: Diagnosis not present

## 2022-02-06 DIAGNOSIS — H02112 Cicatricial ectropion of right lower eyelid: Secondary | ICD-10-CM | POA: Diagnosis not present

## 2022-02-06 DIAGNOSIS — H01112 Allergic dermatitis of right lower eyelid: Secondary | ICD-10-CM | POA: Diagnosis not present

## 2022-02-06 DIAGNOSIS — L121 Cicatricial pemphigoid: Secondary | ICD-10-CM | POA: Diagnosis not present

## 2022-02-06 DIAGNOSIS — H01115 Allergic dermatitis of left lower eyelid: Secondary | ICD-10-CM | POA: Diagnosis not present

## 2022-02-07 ENCOUNTER — Ambulatory Visit (INDEPENDENT_AMBULATORY_CARE_PROVIDER_SITE_OTHER): Payer: Medicare Other | Admitting: Internal Medicine

## 2022-02-07 ENCOUNTER — Other Ambulatory Visit: Payer: Self-pay

## 2022-02-07 ENCOUNTER — Encounter: Payer: Self-pay | Admitting: Internal Medicine

## 2022-02-07 VITALS — BP 124/62 | HR 72 | Temp 97.9°F | Ht 66.0 in | Wt 132.0 lb

## 2022-02-07 DIAGNOSIS — E538 Deficiency of other specified B group vitamins: Secondary | ICD-10-CM | POA: Diagnosis not present

## 2022-02-07 DIAGNOSIS — E119 Type 2 diabetes mellitus without complications: Secondary | ICD-10-CM

## 2022-02-07 DIAGNOSIS — R634 Abnormal weight loss: Secondary | ICD-10-CM | POA: Diagnosis not present

## 2022-02-07 DIAGNOSIS — G44099 Other trigeminal autonomic cephalgias (TAC), not intractable: Secondary | ICD-10-CM | POA: Diagnosis not present

## 2022-02-07 DIAGNOSIS — L129 Pemphigoid, unspecified: Secondary | ICD-10-CM

## 2022-02-07 DIAGNOSIS — N2889 Other specified disorders of kidney and ureter: Secondary | ICD-10-CM

## 2022-02-07 DIAGNOSIS — I10 Essential (primary) hypertension: Secondary | ICD-10-CM

## 2022-02-07 DIAGNOSIS — N183 Chronic kidney disease, stage 3 unspecified: Secondary | ICD-10-CM

## 2022-02-07 DIAGNOSIS — E785 Hyperlipidemia, unspecified: Secondary | ICD-10-CM

## 2022-02-07 LAB — CBC WITH DIFFERENTIAL/PLATELET
Basophils Absolute: 0 10*3/uL (ref 0.0–0.1)
Basophils Relative: 0.4 % (ref 0.0–3.0)
Eosinophils Absolute: 0.2 10*3/uL (ref 0.0–0.7)
Eosinophils Relative: 2.3 % (ref 0.0–5.0)
HCT: 37.3 % — ABNORMAL LOW (ref 39.0–52.0)
Hemoglobin: 12.4 g/dL — ABNORMAL LOW (ref 13.0–17.0)
Lymphocytes Relative: 40.5 % (ref 12.0–46.0)
Lymphs Abs: 2.9 10*3/uL (ref 0.7–4.0)
MCHC: 33.2 g/dL (ref 30.0–36.0)
MCV: 91.9 fl (ref 78.0–100.0)
Monocytes Absolute: 0.8 10*3/uL (ref 0.1–1.0)
Monocytes Relative: 11 % (ref 3.0–12.0)
Neutro Abs: 3.3 10*3/uL (ref 1.4–7.7)
Neutrophils Relative %: 45.8 % (ref 43.0–77.0)
Platelets: 331 10*3/uL (ref 150.0–400.0)
RBC: 4.06 Mil/uL — ABNORMAL LOW (ref 4.22–5.81)
RDW: 13.4 % (ref 11.5–15.5)
WBC: 7.3 10*3/uL (ref 4.0–10.5)

## 2022-02-07 LAB — HEMOGLOBIN A1C: Hgb A1c MFr Bld: 5.9 % (ref 4.6–6.5)

## 2022-02-07 LAB — TSH: TSH: 0.78 u[IU]/mL (ref 0.35–5.50)

## 2022-02-07 LAB — T4, FREE: Free T4: 0.9 ng/dL (ref 0.60–1.60)

## 2022-02-07 MED ORDER — FENOFIBRATE 160 MG PO TABS: 80.0000 mg | ORAL_TABLET | Freq: Every day | ORAL | 3 refills | Status: DC

## 2022-02-07 NOTE — Assessment & Plan Note (Signed)
Cont w/B12 injections

## 2022-02-07 NOTE — Assessment & Plan Note (Addendum)
On Rituxan per Dr Sharen Counter

## 2022-02-07 NOTE — Assessment & Plan Note (Signed)
Will reduce Fenofibrate to 80 mg/d due to arthralgias in fingers

## 2022-02-07 NOTE — Progress Notes (Signed)
Subjective:  Patient ID: Louis Kemp, male    DOB: 27-Jan-1941  Age: 81 y.o. MRN: [id number]  CC: Follow-up   HPI Louis Kemp presents for cluster HAs - had 2 last months. On Emagility, Verapamil. Pt stopped Megace - he was gaining wt. F/u depression - on Lexapro  Outpatient Medications Prior to Visit  Medication Sig Dispense Refill   apraclonidine (IOPIDINE) 0.5 % ophthalmic solution Place 1 drop into both eyes 2 (two) times daily as needed. For ptosis caused by botox. 5 mL 2   Cholecalciferol (VITAMIN D) 2000 UNITS CAPS Take 1 capsule by mouth every other day.     cyanocobalamin (,VITAMIN B-12,) 1000 MCG/ML injection INJECT 1 ML INTO THE MUSCLE EVERY 14 DAYS 30 mL 3   escitalopram (LEXAPRO) 5 MG tablet Take 1 tablet (5 mg total) by mouth at bedtime. 90 tablet 1   Galcanezumab-gnlm (EMGALITY, 300 MG DOSE,) 100 MG/ML SOSY Inject 300 mg (3 pens) into the skin every 30 days during cluster headache cycle and stop when cycle is over. 3 mL 11   HYDROcodone-acetaminophen (NORCO) 7.5-325 MG tablet Take 1-2 tablets by mouth every 6 (six) hours as needed for moderate pain. 100 tablet 0   lidocaine (LIDODERM) 5 % Place 1 patch onto the skin daily. Remove & Discard patch within 12 hours or as directed by MD 30 patch 11   megestrol (MEGACE) 40 MG tablet Take 1 tablet (40 mg total) by mouth daily. 30 tablet 5   metFORMIN (GLUCOPHAGE) 500 MG tablet Take 1 tablet (500 mg total) by mouth 3 (three) times daily. 270 tablet 1   NONFORMULARY OR COMPOUNDED ITEM 4% Lidocaine nasal spray. Directions: Place 2 sprays into right nostril every 1 hour as needed for headache. 1 each 2   tamsulosin (FLOMAX) 0.4 MG CAPS capsule Take 1 capsule (0.4 mg total) by mouth daily. 90 capsule 3   verapamil (CALAN-SR) 240 MG CR tablet Take 1 tablet (240 mg total) by mouth at bedtime. Please stop amlodipine. 90 tablet 3   fenofibrate 160 MG tablet Take 1 tablet (160 mg total) by mouth daily. 90 tablet 3   No  facility-administered medications prior to visit.    ROS: Review of Systems  Constitutional:  Negative for appetite change, fatigue and unexpected weight change.  HENT:  Negative for congestion, nosebleeds, sneezing, sore throat and trouble swallowing.   Eyes:  Positive for visual disturbance. Negative for itching.  Respiratory:  Negative for cough.   Cardiovascular:  Negative for chest pain, palpitations and leg swelling.  Gastrointestinal:  Negative for abdominal distention, blood in stool, diarrhea and nausea.  Genitourinary:  Negative for frequency and hematuria.  Musculoskeletal:  Negative for back pain, gait problem, joint swelling and neck pain.  Skin:  Negative for rash.  Neurological:  Negative for dizziness, tremors, speech difficulty and weakness.  Psychiatric/Behavioral:  Negative for agitation, dysphoric mood and sleep disturbance. The patient is not nervous/anxious.    Objective:  BP 124/62    Pulse 72    Temp 97.9 F (36.6 C) (Oral)    Ht 5\' 6"  (1.676 m)    Wt 132 lb (59.9 kg)    SpO2 96%    BMI 21.31 kg/m   BP Readings from Last 3 Encounters:  02/07/22 124/62  10/03/21 132/70  08/02/21 (!) 142/72    Wt Readings from Last 3 Encounters:  02/07/22 132 lb (59.9 kg)  10/03/21 124 lb 12.8 oz (56.6 kg)  08/02/21 125 lb (56.7 kg)  Physical Exam Constitutional:      General: He is not in acute distress.    Appearance: He is well-developed.     Comments: NAD  Eyes:     Conjunctiva/sclera: Conjunctivae normal.     Pupils: Pupils are equal, round, and reactive to light.  Neck:     Thyroid: No thyromegaly.     Vascular: No JVD.  Cardiovascular:     Rate and Rhythm: Normal rate and regular rhythm.     Heart sounds: Normal heart sounds. No murmur heard.   No friction rub. No gallop.  Pulmonary:     Effort: Pulmonary effort is normal. No respiratory distress.     Breath sounds: Normal breath sounds. No wheezing or rales.  Chest:     Chest wall: No tenderness.   Abdominal:     General: Bowel sounds are normal. There is no distension.     Palpations: Abdomen is soft. There is no mass.     Tenderness: There is no abdominal tenderness. There is no guarding or rebound.  Musculoskeletal:        General: No tenderness. Normal range of motion.     Cervical back: Normal range of motion. No tenderness.  Lymphadenopathy:     Cervical: No cervical adenopathy.  Skin:    General: Skin is warm and dry.     Findings: No rash.  Neurological:     Mental Status: He is alert and oriented to person, place, and time.     Cranial Nerves: No cranial nerve deficit.     Motor: No abnormal muscle tone.     Coordination: Coordination normal.     Gait: Gait normal.     Deep Tendon Reflexes: Reflexes are normal and symmetric.  Psychiatric:        Behavior: Behavior normal.        Thought Content: Thought content normal.        Judgment: Judgment normal.  Looks better  Lab Results  Component Value Date   WBC 7.6 10/03/2021   HGB 12.9 (L) 10/03/2021   HCT 39.3 10/03/2021   PLT 364.0 10/03/2021   GLUCOSE 103 (H) 10/03/2021   CHOL 160 11/10/2020   TRIG 139.0 11/10/2020   HDL 38.20 (L) 11/10/2020   LDLCALC 94 11/10/2020   ALT 14 10/03/2021   AST 23 10/03/2021   NA 139 10/03/2021   K 4.1 10/03/2021   CL 100 10/03/2021   CREATININE 1.33 10/03/2021   BUN 21 10/03/2021   CO2 32 10/03/2021   TSH 0.90 08/02/2021   PSA 0.63 11/10/2020   INR 1.01 09/03/2014   HGBA1C 6.0 08/02/2021   MICROALBUR 1.8 11/10/2020    CT ORBITS W CONTRAST  Result Date: 06/20/2021 CLINICAL DATA:  Uveitis with periorbital cellulitis EXAM: CT ORBITS WITH CONTRAST TECHNIQUE: Multidetector CT images was performed according to the standard protocol following intravenous contrast administration. CONTRAST:  96mL ISOVUE-300 IOPAMIDOL (ISOVUE-300) INJECTION 61% COMPARISON:  No pertinent prior exam. FINDINGS: Orbits: Bilateral cataract extraction. Normal globe bilaterally. No orbital mass edema  or abscess. Mild soft tissue thickening of the right upper eyelid. Normal left eyelid. Visible paranasal sinuses: Mild mucosal edema left maxillary sinus. Remaining sinuses clear. Soft tissues: Mild soft tissue thickening right inferior eyelid. Otherwise no soft tissue edema, mass, or fluid collection Osseous: Negative Limited intracranial: Negative IMPRESSION: Mild mucosal edema right inferior eyelid which may be due to periorbital cellulitis. No orbital mass or abscess. Electronically Signed   By: Franchot Gallo M.D.  On: 06/20/2021 15:15    Assessment & Plan:   Problem List Items Addressed This Visit     B12 deficiency    Cont w/B12 injections      Chronic renal insufficiency, stage 3 (moderate) ([place])    Monitoring GFR Hydrate well      Diabetes type 2, controlled ([place])    On metformin Off Prednisone       Relevant Orders   Hemoglobin A1c   Dyslipidemia - Primary    Will reduce Fenofibrate to 80 mg/d due to arthralgias in fingers      Relevant Medications   fenofibrate 160 MG tablet   Other Relevant Orders   CBC with Differential/Platelet   T4, free   TSH   Hypertension    On Hytrin, Coreg, [place]      Relevant Medications   fenofibrate 160 MG tablet   Pemphigoid    On Rituxan per Dr Sharen Counter      Trigeminal autonomic cephalgias    Better - cluster HAs - had 2 last months. On Emagility, Verapamil F/ w/dr Jaynee Eagles      Relevant Orders   CBC with Differential/Platelet   Comprehensive metabolic panel   Hemoglobin A1c   T4, free   TSH   Weight loss    Resolved  Pt stopped Megace - he was gaining wt.      Relevant Orders   CBC with Differential/Platelet   Comprehensive metabolic panel   Hemoglobin A1c   T4, free   TSH      Meds ordered this encounter  Medications   fenofibrate 160 MG tablet    Sig: Take 0.5 tablets (80 mg total) by mouth daily.    Dispense:  90 tablet    Refill:  3      Follow-up: Return in about 3 months (around 05/07/2022) for  a follow-up visit.  Walker Kehr, MD

## 2022-02-07 NOTE — Assessment & Plan Note (Addendum)
Monitoring GFR Hydrate well 

## 2022-02-07 NOTE — Assessment & Plan Note (Signed)
On Hytrin, Coreg, Losatan

## 2022-02-07 NOTE — Assessment & Plan Note (Signed)
On metformin Off Prednisone

## 2022-02-07 NOTE — Assessment & Plan Note (Addendum)
Better - cluster HAs - had 2 last months. On Emagility, Verapamil F/ w/dr Jaynee Eagles

## 2022-02-07 NOTE — Assessment & Plan Note (Signed)
Resolved  Pt stopped Megace - he was gaining wt.

## 2022-02-08 ENCOUNTER — Other Ambulatory Visit (INDEPENDENT_AMBULATORY_CARE_PROVIDER_SITE_OTHER): Payer: Self-pay

## 2022-02-08 ENCOUNTER — Other Ambulatory Visit: Payer: Self-pay | Admitting: *Deleted

## 2022-02-08 DIAGNOSIS — Z79899 Other long term (current) drug therapy: Secondary | ICD-10-CM | POA: Diagnosis not present

## 2022-02-08 DIAGNOSIS — G44019 Episodic cluster headache, not intractable: Secondary | ICD-10-CM | POA: Diagnosis not present

## 2022-02-08 DIAGNOSIS — L121 Cicatricial pemphigoid: Secondary | ICD-10-CM | POA: Diagnosis not present

## 2022-02-08 DIAGNOSIS — Z0289 Encounter for other administrative examinations: Secondary | ICD-10-CM

## 2022-02-08 DIAGNOSIS — R799 Abnormal finding of blood chemistry, unspecified: Secondary | ICD-10-CM | POA: Diagnosis not present

## 2022-02-08 LAB — COMPREHENSIVE METABOLIC PANEL
ALT: 21 U/L (ref 0–53)
AST: 32 U/L (ref 0–37)
Albumin: 4.3 g/dL (ref 3.5–5.2)
Alkaline Phosphatase: 65 U/L (ref 39–117)
BUN: 20 mg/dL (ref 6–23)
CO2: 26 mEq/L (ref 19–32)
Calcium: 9.7 mg/dL (ref 8.4–10.5)
Chloride: 103 mEq/L (ref 96–112)
Creatinine, Ser: 1.57 mg/dL — ABNORMAL HIGH (ref 0.40–1.50)
GFR: 41.56 mL/min — ABNORMAL LOW (ref >60.00)
Glucose, Bld: 118 mg/dL — ABNORMAL HIGH (ref 70–99)
Potassium: 4.2 mEq/L (ref 3.5–5.1)
Sodium: 137 mEq/L (ref 135–145)
Total Bilirubin: 0.2 mg/dL (ref 0.2–1.2)
Total Protein: 7.5 g/dL (ref 6.0–8.3)

## 2022-02-09 ENCOUNTER — Encounter: Payer: Self-pay | Admitting: Neurology

## 2022-02-09 ENCOUNTER — Ambulatory Visit: Payer: Medicare Other | Admitting: Neurology

## 2022-02-09 VITALS — BP 123/60 | HR 84 | Ht 67.0 in | Wt 135.0 lb

## 2022-02-09 DIAGNOSIS — G44019 Episodic cluster headache, not intractable: Secondary | ICD-10-CM

## 2022-02-09 DIAGNOSIS — L121 Cicatricial pemphigoid: Secondary | ICD-10-CM | POA: Diagnosis not present

## 2022-02-09 LAB — T + B-LYMPHOCYTE DIFFERENTIAL
% CD 3 Pos. Lymph.: 87.6 % — ABNORMAL HIGH (ref 57.5–86.2)
% CD 4 Pos. Lymph.: 31.2 % (ref 30.8–58.5)
Absolute CD 3: 2540 /uL — ABNORMAL HIGH (ref 622–2402)
Absolute CD 4 Helper: 905 /uL (ref 359–1519)
Basophils Absolute: 0 10*3/uL (ref 0.0–0.2)
Basos: 0 %
CD19 % B Cell: 0 % — ABNORMAL LOW (ref 3.3–25.4)
CD19 Abs: 0 /uL — ABNORMAL LOW (ref 12–645)
CD4/CD8 Ratio: 0.55 — ABNORMAL LOW (ref 0.92–3.72)
CD8 % Suppressor T Cell: 56.8 % — ABNORMAL HIGH (ref 12.0–35.5)
CD8 T Cell Abs: 1647 /uL — ABNORMAL HIGH (ref 109–897)
EOS (ABSOLUTE): 0.3 10*3/uL (ref 0.0–0.4)
Eos: 4 %
Hematocrit: 37.2 % — ABNORMAL LOW (ref 37.5–51.0)
Hemoglobin: 12.7 g/dL — ABNORMAL LOW (ref 13.0–17.7)
Immature Grans (Abs): 0 10*3/uL (ref 0.0–0.1)
Immature Granulocytes: 0 %
Lymphocytes Absolute: 2.9 10*3/uL (ref 0.7–3.1)
Lymphs: 43 %
MCH: 30.4 pg (ref 26.6–33.0)
MCHC: 34.1 g/dL (ref 31.5–35.7)
MCV: 89 fL (ref 79–97)
Monocytes Absolute: 0.6 10*3/uL (ref 0.1–0.9)
Monocytes: 9 %
Neutrophils Absolute: 3 10*3/uL (ref 1.4–7.0)
Neutrophils: 44 %
Platelets: 348 10*3/uL (ref 150–450)
RBC: 4.18 x10E6/uL (ref 4.14–5.80)
RDW: 12.6 % (ref 11.6–15.4)
WBC: 6.7 10*3/uL (ref 3.4–10.8)

## 2022-02-09 MED ORDER — UBRELVY 100 MG PO TABS: 100.0000 mg | ORAL_TABLET | ORAL | 0 refills | Status: DC | PRN

## 2022-02-09 MED ORDER — EMGALITY (300 MG DOSE) 100 MG/ML ~~LOC~~ SOSY: PREFILLED_SYRINGE | SUBCUTANEOUS | 11 refills | Status: DC

## 2022-02-09 MED ORDER — ZEMBRACE SYMTOUCH 3 MG/0.5ML ~~LOC~~ SOAJ: 3.0000 mg | SUBCUTANEOUS | 0 refills | Status: DC

## 2022-02-09 NOTE — Progress Notes (Signed)
[facility]    Provider:  Dr Jaynee Eagles Requesting Provider: Alain Marion, Evie Lacks, MD Primary Care Provider:  Cassandria Anger, MD  CC:  Right temporal hedache  02/09/2022: Tremendous improvement on emgality from daily severe multiple episodes of cluster headaches to 2x a month. Still having the headaches twice a month. It may last for 6 hours or 8 hours. We discussed options, continue taking Emgality, his vision is better slightly, he continues on Rituximab in out office and we monitor his labs and send them to Dr. Sharen Counter ophthalmology at [facility]. We discussed acute management when he does have headaches, discussed options and decided: At onset of headache try Ubrelvy with norco at onset of headache. Can repeat ubrelvy 2 hours later if needed Can also try injectable Zembrace with the norco: Take one injection every 15 minutes if needed max 4 in one day  10/10/2021: He is doing great, just a few headaches, doing well on verapamil and emgality, he had some corneal scarring and he had aprocedure which is helping. Follow up phone visit in 3 months  Interval history: Patient her etoday, he is feeling much better. We have him on verapamil and have given him Emaglity cluster headache dose (today will be the second dose). Lidocaine spray, oxygen, nerve block, sphenopalatine blocks did not last.   Interval history: Patient here for intractable episodic cluster headaches in the setting of very rare ocular pemphigoid.  He has intractable periorbital pain in the right with unilateral lacrimation and rhinorrhea.  Initially thought this might be a secondary headache from the very rare ocular pemphigoid, but at this time it appears he has concomitant cluster headaches; the rarity of ocular pemphigoid in addition with cluster headaches is quite unusual.  Today he has ptosis, likely from botox. Botox x 2 has not helped.  We have tried oral triptans, Nurtec, indomethacin, and none have relieved his  symptoms.  Indomethacin was titrated up to 150 mg a day does not appear to be an indomethacin responsive headache but can try going up to 225 mg a day.  We have tried him on Topamax, Zonegran, high-dose steroids for over a month.  We have discontinued his amlodipine and carvedilol and we started verapamil.  Today we also injected him with Emgality.  I performed nerve blocks as well as sphenopalatine ganglion block.  Our plan is to do the following:  Interval history: The patient describes a headache primarily as a constant boring pain.  Certainly, the possibility of hemicrania continua does need to be considered or Cluster headache.  A trial indomethacin(or verapamil or Emgality) may be of some diagnostic benefit. He did not tolerate topamax but may have helped, neither tolerated zonegran, we have tried botox and will repeat, he declines depakote. Will titrate him off of prenisone and try Indomethacin. If no benefit will try Emgality or Verapamil.   Physially he is better, more energy, still having some redness of the right eye but maybe slightly improved, vision has not improved, Stopped the cellcept and on the rituximab.   Interval history: The pain is on the right temple, it happens in the afternoon or overnight about the same time every day, right eye waters(both eyes water), other wise headache wakes him up if he does not take medication, his blood pressure has been a little low he was feeling weak and it was 798 systolic. Taking the amlodipine at 6pm, taking the carvedilol 6-7a, 10pm. BP 108/77 and tachycardic. He took gatorade yesterday and he was better  but this morning again his BP was a little low. We decided to do nerve blocks today. We discussed topamax, CGRP meds, unfrotunately difficult to assess and treat unclear etiology of headaches - associated with his pemphigoid eye disease or is this an independent headache syndrome with associated autonomic symptoms? If no improvement with rituximab this  Thursday we will change course.   Interval history 01/24/2021: I spoke to patient's [place] physician, we are going to try and get infusion here in the office. He also continues on Prednisone. Temple pain/headache is 80% better but he still has pain supraorbitally. Only at night or in the afternoon. But he is very improved. We discussed titrating the steroids and we will stay on 80m for one month then slowly decrease by 80mmonthly. We discussed checking hgba1c and his glucose, I recommended he check his glucose daily, he is feeling well, no problems, his eyes feel much better and are not as red or painful. We received some notes from Dr. PeSharen Counterbut no orders, I have contacted him to get specifics on the pre-treatment, the injection doses and any labwork he wants prior to infusions. He is taking omeprazole daily, taking steroids with food as well, no GI upset.   HPI:  Louis JAQUITHs a [age].o. male here as requested by [name], AlEvie LacksMD for headaches. PMHx ocular pemphigoid, B12 deficiency, HTN, PAC, DM2. New onset right temporal headache, continuous, sever at times.  Headaches started 2 weeks ago, it wasn't that bad but it is worsening and now can be severe, it is in the temple and radiates to the temple and frontal area. It becomes quite severe 7-8/10. Sharp pain, shooting pain, he had prior headache in the left eye which was not similar it was ocular pain his eye was so inflamed due to the Pemphigoid. This is not the same, this is new, and is not anything he has ever experienced. He has noticed in the last 4-5 days when he eats he has jaw pain. The right eye vision is worsening. No significant history of migraines. No other significant history of headaches, he had one migraine in the remote past and this is nothing like it. The pain is continuous, no known triggers. Alleve is not working. Had a long discussion with patient regarding possiibilites and differential. No other focal neurologic deficits,  associated symptoms, inciting events or modifiable factors.  Reviewed notes, labs and imaging from outside physicians, which showed:  Personally reviewed imaging with Dr. PrMamie Nickethi(Neuroradiology) and agree with the following:mild T2 hyperintensities, mild cerebral atrophy, otherwise unremarkable for age.   11/10/2020: TSH nml, B12 1056, cbc unremarkable, CMP elevated glucose and reduced GFR 52 otherwise normal Review of Systems: Patient complains of symptoms per HPI as well as the following symptoms: headache, vision changes . Pertinent negatives and positives per HPI. All others negative    Social History   Socioeconomic History   Marital status: Married    Spouse name: [name]   Number of children: Not on file   Years of education: Not on file   Highest education level: Not on file  Occupational History   Occupation: MD Retired  Tobacco Use   Smoking status: Former    Types: Cigars   Smokeless tobacco: Never   Tobacco comments:    3-4 cigars per year in the past but none in the past 10 years  Vaping Use   Vaping Use: Never used  Substance and Sexual Activity   Alcohol use: Yes  Alcohol/week: 1.0 standard drink    Types: 1 Standard drinks or equivalent per week    Comment: maybe 1 mixed drink maybe once a week   Drug use: No   Sexual activity: Not Currently  Other Topics Concern   Not on file  Social History Narrative   Regular Exercise -  YES, golf   Lives at home with wife    Right handed   Caffeine:  1-2 cups/day   Social Determinants of Health   Financial Resource Strain: Not on file  Food Insecurity: Not on file  Transportation Needs: Not on file  Physical Activity: Not on file  Stress: Not on file  Social Connections: Not on file  Intimate Partner Violence: Not on file    Family History  Problem Relation Age of Onset   Arthritis Mother    Hypertension Father    Coronary artery disease Father    Hypertension Brother    Hypertension Other     Hypertension Brother    Coronary artery disease Other     Past Medical History:  Diagnosis Date   BPH (benign prostatic hypertrophy)    Hyperlipidemia    Hypertension    LBP (low back pain)    MVP (mitral valve prolapse)    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   on cellcept   Osteoarthritis    PAC (premature atrial contraction)    Statin intolerance    Type II or unspecified type diabetes mellitus without mention of complication, not stated as uncontrolled 2011   Vitamin B 12 deficiency 2011   Vitamin D deficiency    Vitiligo     Patient Active Problem List   Diagnosis Date Noted   Malnutrition ([place]) 06/23/2021   Episodic cluster headache, not intractable 06/09/2021   Ocular pemphigoid 05/19/2021   Chronic sinusitis 05/12/2021   Depression 05/12/2021   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   Right sided temporal headache 01/06/2021   Chronic renal insufficiency, stage 3 (moderate) ([place]) 05/11/2020   Pemphigoid 05/10/2020   Elevated serum creatinine 07/11/2018   Hip pain, acute, right 01/10/2018   Postoperative anemia due to acute blood loss 09/30/2014   Status post left partial knee replacement 09/15/2014   S/P left UKR 09/14/2014   S/P right TKA 06/15/2014   Osteoarthritis, knee 11/24/2013   Weight loss 06/24/2013   PAC (premature atrial contraction) 01/13/2013   Heart murmur 01/13/2013   Well adult exam 11/03/2012   Paresthesia 10/22/2012   Hypertension 04/18/2012   Diabetes type 2, controlled ([place]) 10/20/2010   B12 deficiency 10/20/2010   Vitamin D deficiency 10/20/2010   Dyslipidemia 07/14/2010   BENIGN PROSTATIC HYPERTROPHY 07/14/2010   OSTEOARTHRITIS 07/14/2010   LOW BACK PAIN 07/14/2010   HYPERGLYCEMIA 07/14/2010    Past Surgical History:  Procedure Laterality Date   arm surgery Left    ORIF left  forearm   CATARACT EXTRACTION  2012   EYE SURGERY     eyelid surgery Right    HERNIA REPAIR Bilateral 1971   PARTIAL KNEE ARTHROPLASTY Left  09/14/2014   Procedure: LEFT UNICOMPARTMENTAL KNEE ARTHROPLASTY MEDIALLY;  Surgeon: Mauri Pole, MD;  Location: [facility];  Service: Orthopedics;  Laterality: Left;   TOTAL KNEE ARTHROPLASTY Right 06/15/2014   Procedure: RIGHT TOTAL KNEE ARTHROPLASTY;  Surgeon: Mauri Pole, MD;  Location: [facility];  Service: Orthopedics;  Laterality: Right;    Current Outpatient Medications  Medication Sig Dispense Refill   Cholecalciferol (VITAMIN D) 2000 UNITS CAPS Take 1 capsule by mouth every  other day.     cyanocobalamin (,VITAMIN B-12,) 1000 MCG/ML injection INJECT 1 ML INTO THE MUSCLE EVERY 14 DAYS 30 mL 3   escitalopram (LEXAPRO) 5 MG tablet Take 1 tablet (5 mg total) by mouth at bedtime. 90 tablet 1   fenofibrate 160 MG tablet Take 0.5 tablets (80 mg total) by mouth daily. 90 tablet 3   HYDROcodone-acetaminophen (NORCO) 7.5-325 MG tablet Take 1-2 tablets by mouth every 6 (six) hours as needed for moderate pain. 100 tablet 0   lidocaine (LIDODERM) 5 % Place 1 patch onto the skin daily. Remove & Discard patch within 12 hours or as directed by MD 30 patch 11   metFORMIN (GLUCOPHAGE) 500 MG tablet Take 1 tablet (500 mg total) by mouth 3 (three) times daily. 270 tablet 1   NONFORMULARY OR COMPOUNDED ITEM 4% Lidocaine nasal spray. Directions: Place 2 sprays into right nostril every 1 hour as needed for headache. 1 each 2   SUMAtriptan Succinate (ZEMBRACE SYMTOUCH) 3 MG/0.5ML SOAJ Inject 3 mg into the skin every 15 (fifteen) minutes for 1 dose. May repeat in 15 minutes. If symptoms persist, repeat in 2 hours. Max 4 injections daily. 2 mL 0   tamsulosin (FLOMAX) 0.4 MG CAPS capsule Take 1 capsule (0.4 mg total) by mouth daily. 90 capsule 3   Ubrogepant (UBRELVY) 100 MG TABS Take 100 mg by mouth every 2 (two) hours as needed. Maximum 223m a day. 4 tablet 0   verapamil (CALAN-SR) 240 MG CR tablet Take 1 tablet (240 mg total) by mouth at bedtime. Please stop amlodipine. 90 tablet 3   Galcanezumab-gnlm (EMGALITY,  300 MG DOSE,) 100 MG/ML SOSY Inject 300 mg (3 pens) into the skin every 30 days during cluster headache cycle and stop when cycle is over. 3 mL 11   No current facility-administered medications for this visit.    Allergies as of 02/09/2022 - Review Complete 02/09/2022  Allergen Reaction Noted   Statins  06/23/2021   Atorvastatin  10/20/2010   Rosuvastatin  10/20/2010   Zonisamide  05/12/2021    Vitals: BP 123/60 (BP Location: Right Arm, Patient Position: Sitting)    Pulse 84    Ht 5' 7"  (1.702 m)    Wt 135 lb (61.2 kg)    BMI 21.14 kg/m  Last Weight:  Wt Readings from Last 1 Encounters:  02/09/22 135 lb (61.2 kg)   Last Height:   Ht Readings from Last 1 Encounters:  02/09/22 5' 7"  (1.702 m)     Physical exam: stable Exam: Gen: Improved today does not appear in as much pain, +lacrimation right eye, photophobia, conversant, well nourised,  well groomed                     CV: RRR, no MRG. No Carotid Bruits. No peripheral edema, warm, nontender Eyes: Conjunctivae injection with lacrimation right eye  Neuro: stable Detailed Neurologic Exam  Speech:    Speech is normal; fluent and spontaneous with normal comprehension.  Cognition:    The patient is oriented to person, place, and time;     recent and remote memory intact;     language fluent;     normal attention, concentration,     fund of knowledge Cranial Nerves:    The pupils are equal, round, and reactive to light. Could not visualize fundi due to photophobia and small pupils. Visual fields are full to threat. Extraocular movements are intact. Trigeminal sensation is intact and the muscles of mastication are  normal. The face is symmetric. The palate elevates in the midline. Hearing intact. Voice is normal. Shoulder shrug is normal. The tongue has normal motion without fasciculations.   Coordination:    No dysmetria or ataxia  Gait:    Normal native gait  Motor Observation:    No asymmetry, no atrophy, and no  involuntary movements noted. Tone:    Normal muscle tone.    Posture:    Posture is normal. normal erect    Strength:    Strength is V/V in the upper and lower limbs.      Sensation: intact to LT     Reflex Exam:  DTR's:    Deep tendon reflexes in the upper and lower extremities are symmetrical bilaterally.   Toes:    The toes are equiv bilaterally.   Clonus:    Clonus is absent.    Assessment/Plan:   81 y.o. male here as requested by [name], Evie Lacks, MD for headaches. PMHx ocular pemphigoid, B12 deficiency, HTN, PAC, DM2. New onset right temporal headache in the setting of pemphigoid Autoimmune eye disease), continuous, severe at times, no signifcant improvement with steroids. Unfortunately difficult to assess and treat unclear etiology of headaches - associated with his pemphigoid eye disease or is this an independent headache syndrome with associated autonomic symptoms? MRI of the of the brain and carotid ultrasound were completed without etiology.   Better with verapamil and Emgality. Now on Verapamil 214m a day (cannot tolerate higher) for cluster headache dosage. Appears to be responding to cluster headache treatment excellent. Tremendous improvement on emgality from daily severe multiple episodes of cluster headaches to 2x a month. Still having the headaches twice a month. It may last for 6 hours or 8 hours.  We discussed acute management when he does have headaches, discussed options and decided: At onset of headache try Ubrelvy with norco at onset of headache. Can repeat ubrelvy 2 hours later if needed, Can also try injectable Zembrace with the norco: Take one injection every 15 minutes if needed max 4 in one day gave samples  Nerve blocks, sphenopalatine blocks,Indomethacin, lidocaine nasal spray did not help. He had side effects to imitrex. He did not tolerate topamax but may have helped, neither tolerated zonegran. Botox did not help, nurtec did not help, he declined  depakote, , high dose prednisone did not help significantly. has resolved ptosis from last botox injections. oxygen 15 L high flow for acute events did not help, will cancel order  MRI of the brain was negative as were carotid dopplers.  Ordered CT of the orbits which showed swelling underneath the right eyelid suggestive of orbital cellulitis,finished Keflex 5048mbid start 7 days still swollen  Continue: Rituximab and [facility]. For ocular pemphigoid  I spent over 30  minutes of face-to-face and non-face-to-face time with patient on the  1. Episodic cluster headache, not intractable   2. Ocular pemphigoid      diagnosis.  This included previsit chart review, lab review, study review, order entry, electronic health record documentation, patient education on the different diagnostic and therapeutic options, counseling and coordination of care, risks and benefits of management, compliance, or risk factor reduction   PRIOR Assessment and plan:   I spoke to [facility], we are going to try and get infusion here in the office. He also continues on Prednisone 6051mTemple pain/headache is 80% better but he still has pain supraorbitally however only at night or in the afternoon.  But he is very improved. We discussed titrating the steroids and we will stay on 74m for one month then slowly decrease by 120mmonthly. We discussed checking hgba1c and his glucose, I recommended he check his glucose daily, he is feeling well, no problems, his eyes feel much better and are not as red or painful. We received some notes from Dr. PeSharen Counterbut no orders, I have contacted him to get specifics on Rituximab including the pre-treatment, the injection doses and any labwork he wants prior to infusions. He is taking omeprazole daily, taking steroids with food as well, no GI upset.  Unclear etiology of headache but it has to be inflammatory/vascular or somehow related to the  ocular pemphigoid, I doubt he would develop migraines or cluster headache at the age of [age]ut trigeminal autonomic cephalalgias in the differential. Still we spoke about trying Depakote or other migraine medication(I do not think this is a migraine but it still may benefit from these medications). We decided to stay with the prednisone as it is helping after only 2 weeks and can consider other medications if needed (he is not enthusiastic about topamax or gabapentin, nurtec helped a little, wonder if qulipta would be helpful or depakote)  PRIOR Patient is on cellcept so I would think Temporal Arteritis(GCA) less likely however he has a new right temporal headache (he has no significant headache history) over the last 2 weeks with jaw symptoms and worsening vision. I am going to treat him for GCA and order esr/crp however not sure they will be reliable. Will order Ultra Sound of the temporal arteries, if esr/crp elevated we can discuss a biopsy but may be elevated due to his current inflammatory eye disease. I have contacted his ocular immunologist Dr. PeSharen Counter  (Addendum: ESR 31(ULN 30), CRP 13(ULN 10), temporal artery ultrasound without evidence of halo sign)  ViSeveriano GilbertMD ocular immunologist [phone] No orders of the defined types were placed in this encounter.   Meds ordered this encounter  Medications   SUMAtriptan Succinate (ZEMBRACE SYMTOUCH) 3 MG/0.5ML SOAJ    Sig: Inject 3 mg into the skin every 15 (fifteen) minutes for 1 dose. May repeat in 15 minutes. If symptoms persist, repeat in 2 hours. Max 4 injections daily.    Dispense:  2 mL    Refill:  0   Ubrogepant (UBRELVY) 100 MG TABS    Sig: Take 100 mg by mouth every 2 (two) hours as needed. Maximum 20059m day.    Dispense:  4 tablet    Refill:  0   Galcanezumab-gnlm (EMGALITY, 300 MG DOSE,) 100 MG/ML SOSY    Sig: Inject 300 mg (3 pens) into the skin every 30 days during cluster headache cycle and stop when cycle is over.     Dispense:  3 mL    Refill:  11     Cc: [name], AleEvie LacksD,  VicSeveriano GilbertD ocular immunologist [phone] IllD  [facility] [id number] StreetiDry RidgeeClintonC [id number] [phone] [phone] spent 30 minutes of face-to-face and non-face-to-face time with patient on the  1. Episodic cluster headache, not intractable   2. Ocular pemphigoid       diagnosis.  This included previsit chart review, lab review, study review, order entry, electronic health record documentation, patient education on the different diagnostic and therapeutic options, counseling and coordination of care, risks and benefits of management, compliance, or risk  factor reduction. This does not include time spent on nerve block.

## 2022-02-09 NOTE — Patient Instructions (Addendum)
At onset of headache try Ubrelvy with norco at onset of headache. Can repeat ubrelvy 2 hours later if needed Can also try injectable Zembrace with the norco: Take one injection every 15 minutes if needed max 4 in one day  +++Sumatriptan Injection What is this medication? SUMATRIPTAN (soo ma TRIP tan) treats migraines and cluster headaches. It works by blocking pain signals and narrowing blood vessels in the brain. It belongs to a group of medications called triptans. It is not used to prevent headaches or migraines. This medicine may be used for other purposes; ask your health care provider or pharmacist if you have questions. COMMON BRAND NAME(S): Alsuma, Imitrex, Imitrex STAT dose, Sumavel DosePro System, ZEMBRACE What should I tell my care team before I take this medication? They need to know if you have any of these conditions: Cigarette smoker Circulation problems in fingers and toes Diabetes Heart disease High blood pressure High cholesterol History of irregular heartbeat History of stroke Kidney disease Liver disease Stomach or intestine problems An unusual or allergic reaction to sumatriptan, latex, other medications, foods, dyes, or preservatives Pregnant or trying to get pregnant Breast-feeding How should I use this medication? This medication is for injection under the skin. You will be taught how to prepare and give this medication. Use exactly as directed. Do not take your medication more often than directed. Talk to your care team regarding the use of this medication in children. Special care may be needed. Overdosage: If you think you have taken too much of this medicine contact a poison control center or emergency room at once. NOTE: This medicine is only for you. Do not share this medicine with others. What if I miss a dose? This does not apply. This medication is not for regular use. What may interact with this medication? Do not take this medication with any of the  following: Certain medications for migraine headache like almotriptan, eletriptan, frovatriptan, naratriptan, rizatriptan, sumatriptan, zolmitriptan Ergot alkaloids like dihydroergotamine, ergonovine, ergotamine, methylergonovine MAOIs like Carbex, Eldepryl, Marplan, Nardil, and Parnate This medication may also interact with the following: Certain medications for depression, anxiety, or psychotic disorders This list may not describe all possible interactions. Give your health care provider a list of all the medicines, herbs, non-prescription drugs, or dietary supplements you use. Also tell them if you smoke, drink alcohol, or use illegal drugs. Some items may interact with your medicine. What should I watch for while using this medication? Visit your care team for regular checks on your progress. Tell your care team if your symptoms do not start to get better or if they get worse. You may get drowsy or dizzy. Do not drive, use machinery, or do anything that needs mental alertness until you know how this medication affects you. Do not stand up or sit up quickly, especially if you are an older patient. This reduces the risk of dizzy or fainting spells. Alcohol may interfere with the effect of this medication. Tell your care team right away if you have any change in your eyesight. If you take migraine medications for 10 or more days a month, your migraines may get worse. Keep a diary of headache days and medication use. Contact your care team if your migraine attacks occur more frequently. What side effects may I notice from receiving this medication? Side effects that you should report to your care team as soon as possible: Allergic reactions--skin rash, itching, hives, swelling of the face, lips, tongue, or throat Burning, pain, tingling, or color  changes in the legs or feet Heart attack--pain or tightness in the chest, shoulders, arms, or jaw, nausea, shortness of breath, cold or clammy skin, feeling  faint or lightheaded Heart rhythm changes--fast or irregular heartbeat, dizziness, feeling faint or lightheaded, chest pain, trouble breathing Increase in blood pressure Raynaud's--cool, numb, or painful fingers or toes that may change color from pale, to blue, to red Seizures Serotonin syndrome--irritability, confusion, fast or irregular heartbeat, muscle stiffness, twitching muscles, sweating, high fever, seizure, chills, vomiting, diarrhea Stroke--sudden numbness or weakness of the face, arm, or leg, trouble speaking, confusion, trouble walking, loss of balance or coordination, dizziness, severe headache, change in vision Sudden or severe stomach pain, nausea, vomiting, fever, or bloody diarrhea Vision loss Side effects that usually do not require medical attention (report to your care team if they continue or are bothersome): Dizziness Facial flushing, redness General discomfort or fatigue This list may not describe all possible side effects. Call your doctor for medical advice about side effects. You may report side effects to FDA at 1-800-FDA-1088. Where should I keep my medication? Keep out of the reach of children and pets. You will be instructed on how to store this medication. Throw away any unused medication after the expiration date on the label. NOTE: This sheet is a summary. It may not cover all possible information. If you have questions about this medicine, talk to your doctor, pharmacist, or health care provider.  2022 Elsevier/Gold Standard (2021-01-07 00:00:00) Ubrogepant tablets What is this medication? UBROGEPANT (ue BROE je pant) is used to treat migraine headaches with or without aura. An aura is a strange feeling or visual disturbance that warns you of an attack. It is not used to prevent migraines. This medicine may be used for other purposes; ask your health care provider or pharmacist if you have questions. COMMON BRAND NAME(S): Roselyn Meier What should I tell my care  team before I take this medication? They need to know if you have any of these conditions: kidney disease liver disease an unusual or allergic reaction to ubrogepant, other medicines, foods, dyes, or preservatives pregnant or trying to get pregnant breast-feeding How should I use this medication? Take this medicine by mouth with a glass of water. Follow the directions on the prescription label. You can take it with or without food. If it upsets your stomach, take it with food. Take your medicine at regular intervals. Do not take it more often than directed. Do not stop taking except on your doctor's advice. Talk to your pediatrician about the use of this medicine in children. Special care may be needed. Overdosage: If you think you have taken too much of this medicine contact a poison control center or emergency room at once. NOTE: This medicine is only for you. Do not share this medicine with others. What if I miss a dose? This does not apply. This medicine is not for regular use. What may interact with this medication? Do not take this medicine with any of the following medicines: ceritinib certain antibiotics like chloramphenicol, clarithromycin, telithromycin certain antivirals for HIV like atazanavir, cobicistat, darunavir, delavirdine, fosamprenavir, indinavir, ritonavir certain medicines for fungal infections like itraconazole, ketoconazole, posaconazole, voriconazole conivaptan grapefruit idelalisib mifepristone nefazodone ribociclib This medicine may also interact with the following medications: carvedilol certain medicines for seizures like phenobarbital, phenytoin ciprofloxacin cyclosporine eltrombopag fluconazole fluvoxamine quinidine rifampin [facility]. John's wort verapamil This list may not describe all possible interactions. Give your health care provider a list of all the medicines, herbs, non-prescription drugs,  or dietary supplements you use. Also tell them if you  smoke, drink alcohol, or use illegal drugs. Some items may interact with your medicine. What should I watch for while using this medication? Visit your health care professional for regular checks on your progress. Tell your health care professional if your symptoms do not start to get better or if they get worse. Your mouth may get dry. Chewing sugarless gum or sucking hard candy and drinking plenty of water may help. Contact your health care professional if the problem does not go away or is severe. What side effects may I notice from receiving this medication? Side effects that you should report to your doctor or health care professional as soon as possible: allergic reactions like skin rash, itching or hives; swelling of the face, lips, or tongue Side effects that usually do not require medical attention (report these to your doctor or health care professional if they continue or are bothersome): drowsiness dry mouth nausea tiredness This list may not describe all possible side effects. Call your doctor for medical advice about side effects. You may report side effects to FDA at 1-800-FDA-1088. Where should I keep my medication? Keep out of the reach of children. Store at room temperature between 15 and 30 degrees C (59 and 86 degrees F). Throw away any unused medicine after the expiration date. NOTE: This sheet is a summary. It may not cover all possible information. If you have questions about this medicine, talk to your doctor, pharmacist, or health care provider.  2022 Elsevier/Gold Standard (2019-02-27 00:00:00)

## 2022-02-13 ENCOUNTER — Telehealth: Payer: Self-pay | Admitting: *Deleted

## 2022-02-13 NOTE — Telephone Encounter (Signed)
Completed Roselyn Meier PA on Cover My Meds. Key: [id number]. Awaiting determination from [facility].

## 2022-02-13 NOTE — Telephone Encounter (Signed)
Approved today Request Reference Number: [id number]. UBRELVY TAB 100 MG is approved through 12/24/2022. Your patient may now fill this prescription and it will be covered.   Faxed approval letter to pharmacy. Received a receipt of confirmation.

## 2022-02-16 DIAGNOSIS — L121 Cicatricial pemphigoid: Secondary | ICD-10-CM | POA: Diagnosis not present

## 2022-03-03 DIAGNOSIS — H16103 Unspecified superficial keratitis, bilateral: Secondary | ICD-10-CM | POA: Diagnosis not present

## 2022-03-03 DIAGNOSIS — H02115 Cicatricial ectropion of left lower eyelid: Secondary | ICD-10-CM | POA: Diagnosis not present

## 2022-03-03 DIAGNOSIS — H5711 Ocular pain, right eye: Secondary | ICD-10-CM | POA: Diagnosis not present

## 2022-03-03 DIAGNOSIS — H11233 Symblepharon, bilateral: Secondary | ICD-10-CM | POA: Diagnosis not present

## 2022-03-07 IMAGING — CT CT ORBITS W/ CM
3 series · 7 of 43 positions shown, 8 images · IV contrast (iopamidol)
Comparison: No pertinent prior exam.

CLINICAL DATA: Uveitis with periorbital cellulitis

EXAM:
CT ORBITS WITH CONTRAST
TECHNIQUE: Multidetector CT images was performed according to the standard
protocol following intravenous contrast administration.
CONTRAST:  75mL RMIVYU-K00 IOPAMIDOL (RMIVYU-K00) INJECTION 61%

[Series 4: orbits 2.00 hr36 s3 axial soft · axial · 0.32mm/px · z∈[-601,-601]mm · 1 of 26 slices shown, 2 images]
[im 14/26  brain]
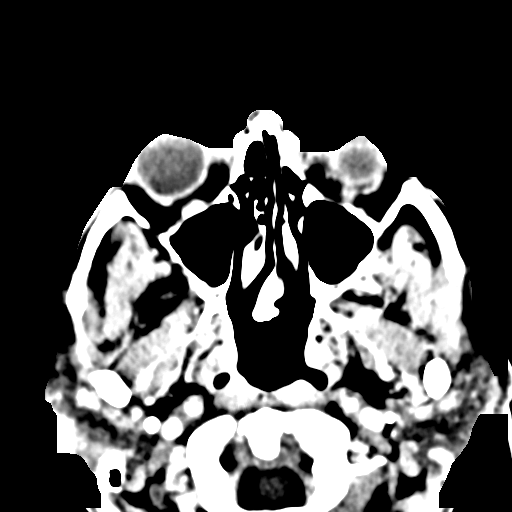
[im 14/26  bone]
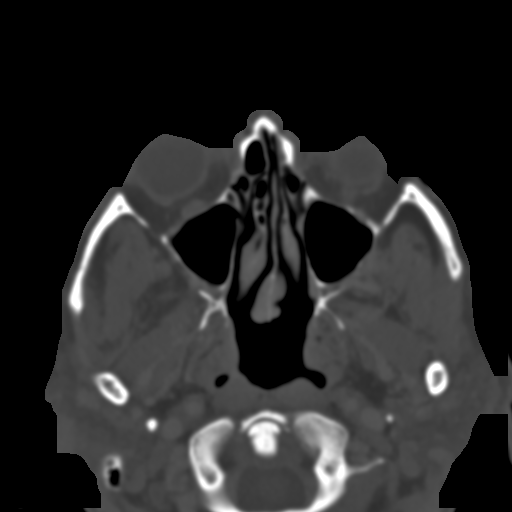

[Series 7: orbits 2.00 hr44 s3 cor soft · coronal · 0.10mm/px · 3 of 82 slices shown]
[im 28/82  bone]
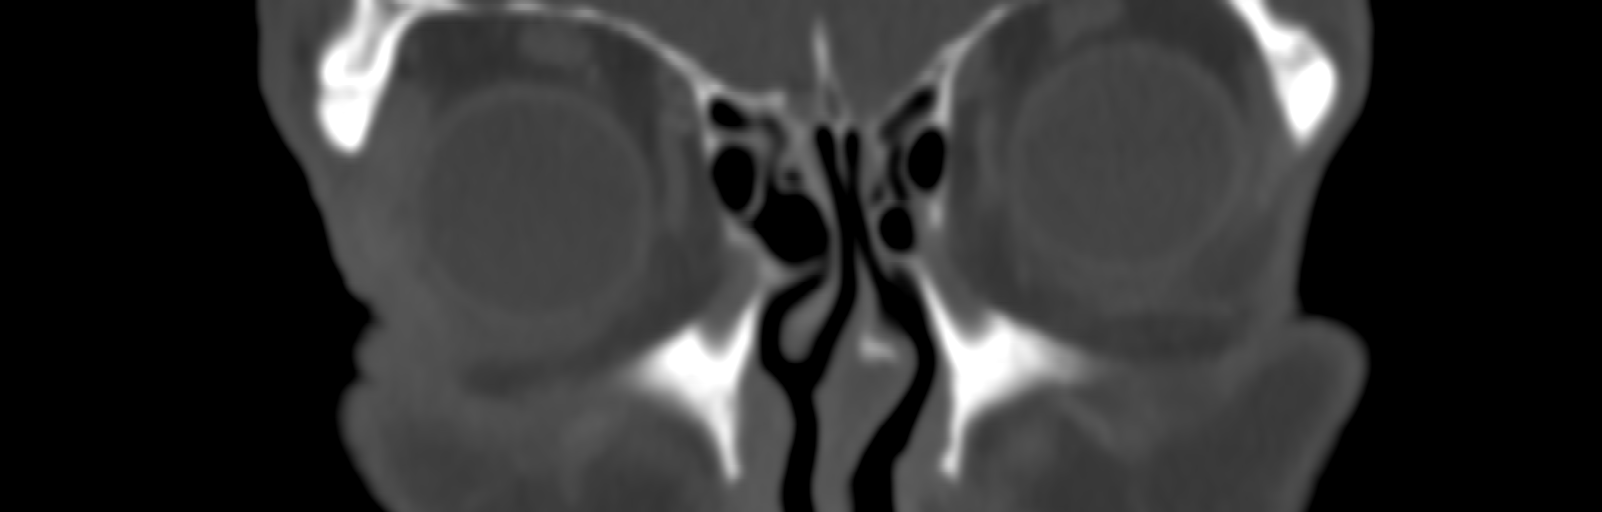
[im 37/82  bone]
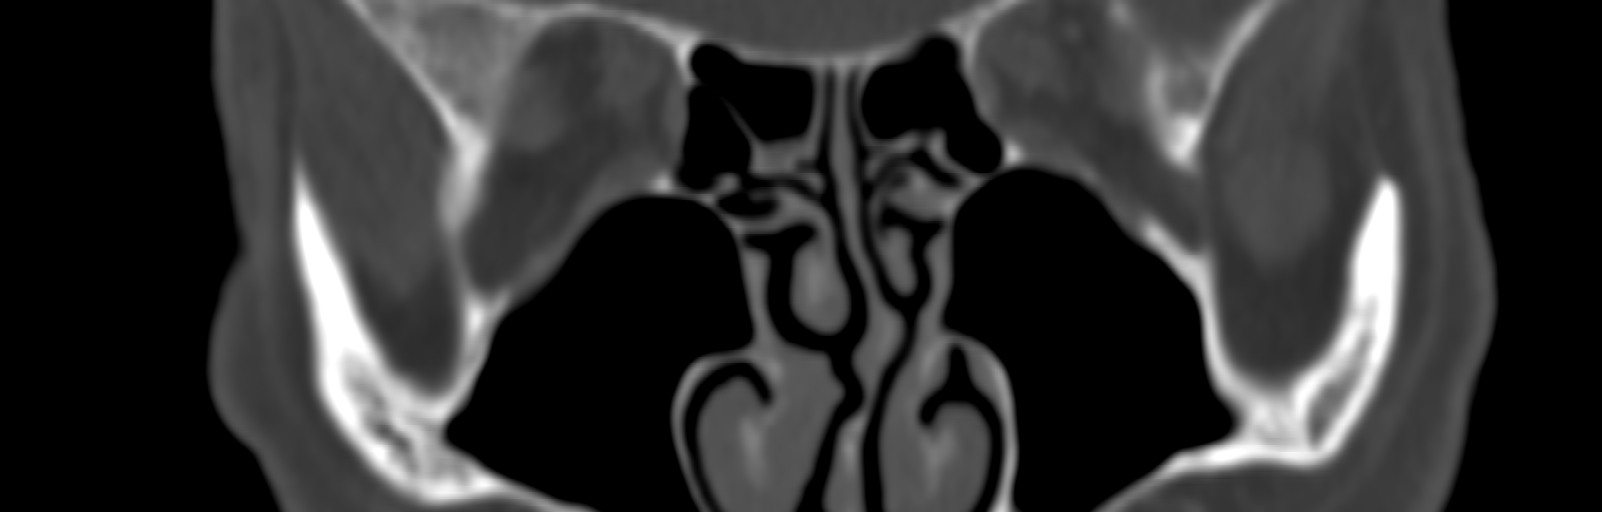
[im 46/82  bone]
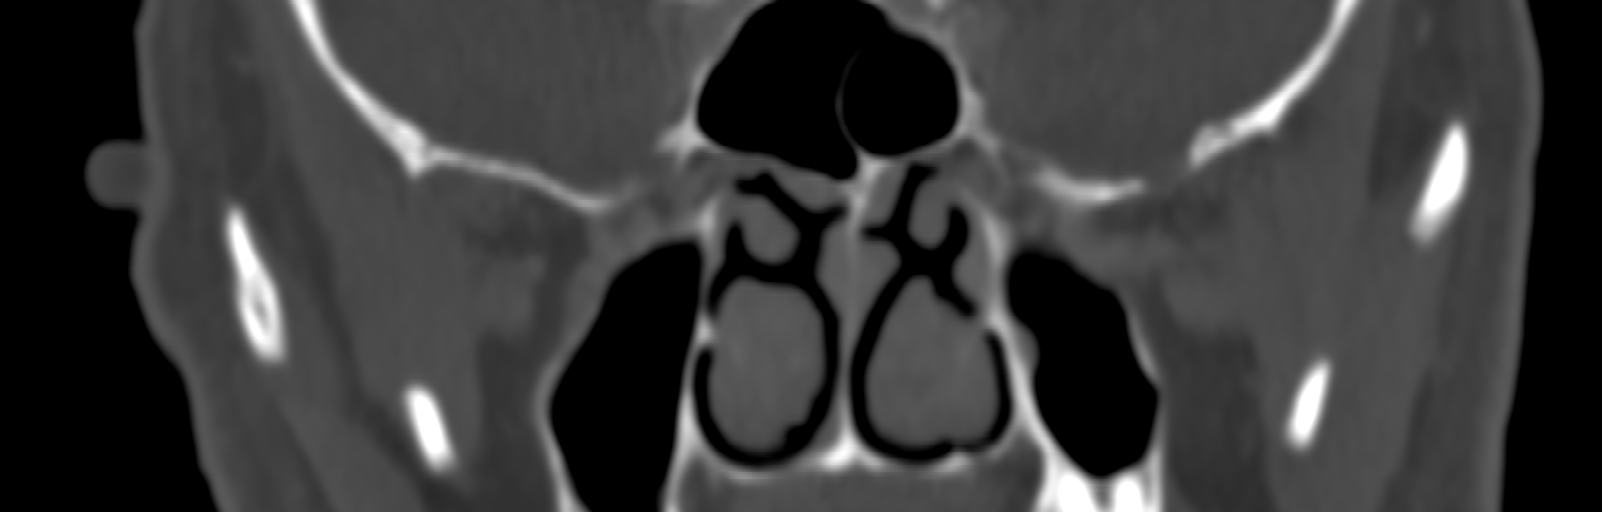

[Series 11: orbits 2.00 hr44 s3 sag soft · sagittal · 0.10mm/px · 3 of 82 slices shown]
[im 28/82  bone]
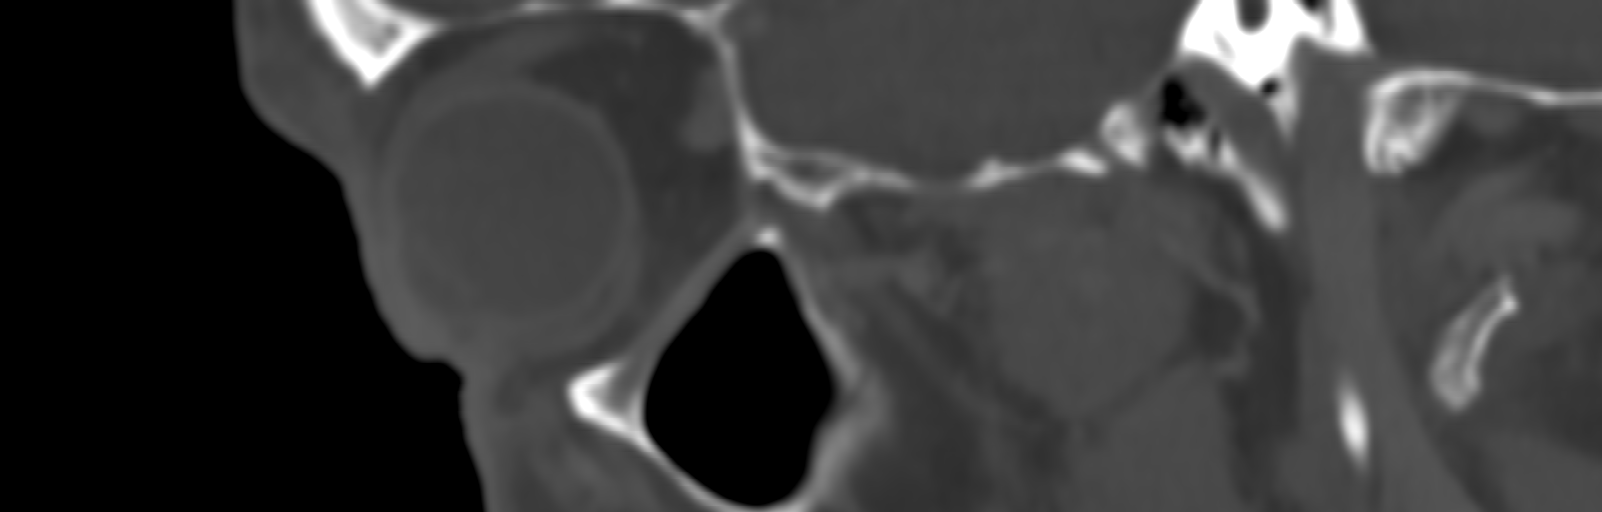
[im 41/82  bone]
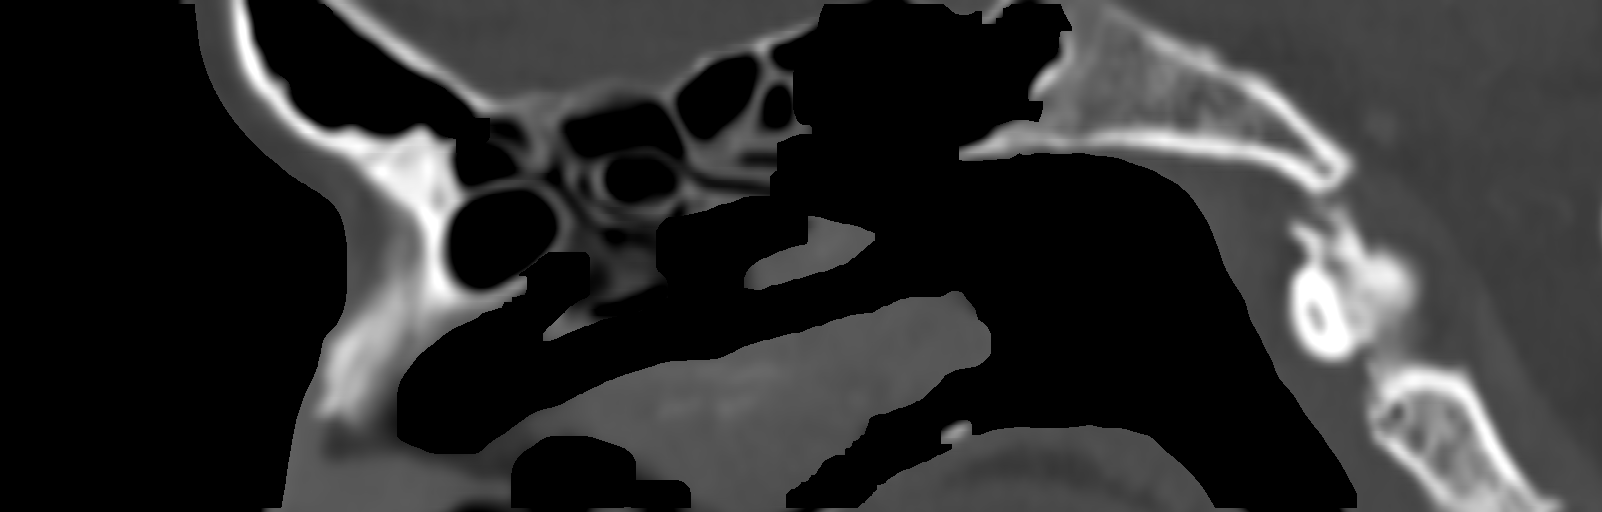
[im 55/82  bone]
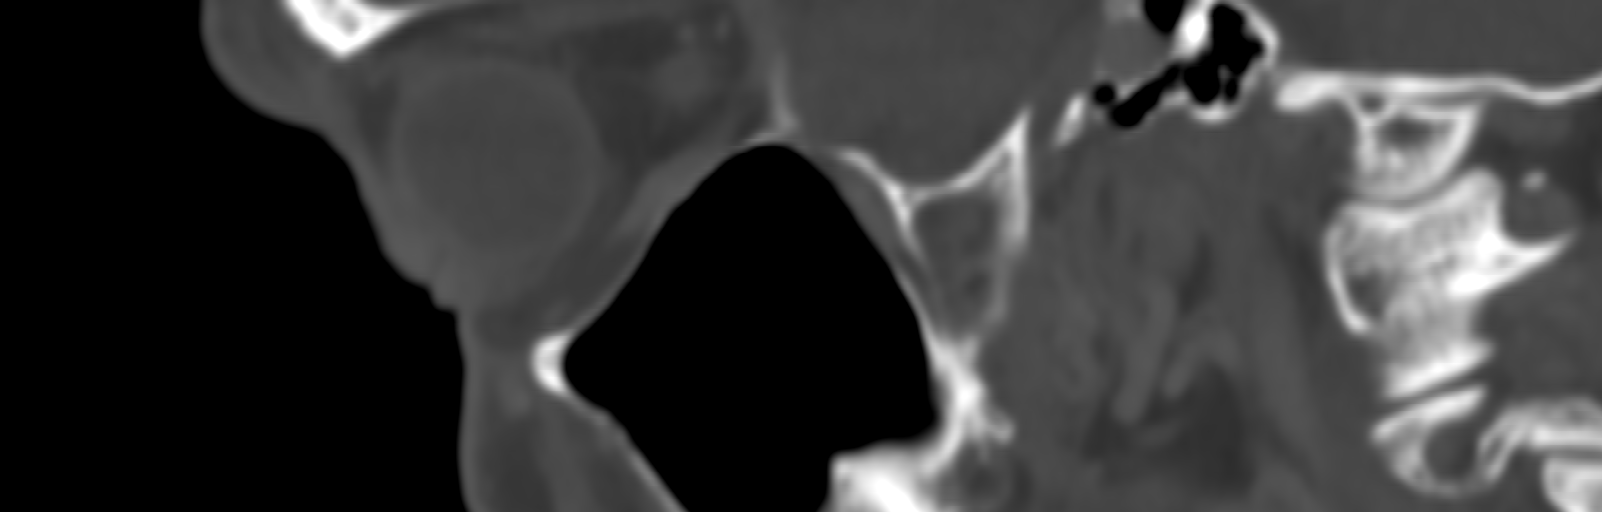

[7 of 43 positions shown; findings below may reference images not displayed]

FINDINGS: Orbits: Bilateral cataract extraction. Normal globe bilaterally. No
orbital mass edema or abscess. Mild soft tissue thickening of the
right upper eyelid. Normal left eyelid.

Visible paranasal sinuses: Mild mucosal edema left maxillary sinus.
Remaining sinuses clear.

Soft tissues: Mild soft tissue thickening right inferior eyelid.
Otherwise no soft tissue edema, mass, or fluid collection

Osseous: Negative

Limited intracranial: Negative
IMPRESSION: Mild mucosal edema right inferior eyelid which may be due to
periorbital cellulitis. No orbital mass or abscess.

## 2022-03-08 ENCOUNTER — Other Ambulatory Visit: Payer: Self-pay | Admitting: Internal Medicine

## 2022-03-14 DIAGNOSIS — H11233 Symblepharon, bilateral: Secondary | ICD-10-CM | POA: Diagnosis not present

## 2022-03-14 DIAGNOSIS — H16103 Unspecified superficial keratitis, bilateral: Secondary | ICD-10-CM | POA: Diagnosis not present

## 2022-03-14 DIAGNOSIS — H5711 Ocular pain, right eye: Secondary | ICD-10-CM | POA: Diagnosis not present

## 2022-03-14 DIAGNOSIS — H16423 Pannus (corneal), bilateral: Secondary | ICD-10-CM | POA: Diagnosis not present

## 2022-03-14 DIAGNOSIS — H02051 Trichiasis without entropian right upper eyelid: Secondary | ICD-10-CM | POA: Diagnosis not present

## 2022-03-23 DIAGNOSIS — H18891 Other specified disorders of cornea, right eye: Secondary | ICD-10-CM | POA: Diagnosis not present

## 2022-03-23 DIAGNOSIS — H1089 Other conjunctivitis: Secondary | ICD-10-CM | POA: Diagnosis not present

## 2022-05-02 DIAGNOSIS — H168 Other keratitis: Secondary | ICD-10-CM | POA: Diagnosis not present

## 2022-05-02 DIAGNOSIS — H18893 Other specified disorders of cornea, bilateral: Secondary | ICD-10-CM | POA: Diagnosis not present

## 2022-05-02 DIAGNOSIS — E1122 Type 2 diabetes mellitus with diabetic chronic kidney disease: Secondary | ICD-10-CM | POA: Diagnosis not present

## 2022-05-02 DIAGNOSIS — Z79899 Other long term (current) drug therapy: Secondary | ICD-10-CM | POA: Diagnosis not present

## 2022-05-02 DIAGNOSIS — I129 Hypertensive chronic kidney disease with stage 1 through stage 4 chronic kidney disease, or unspecified chronic kidney disease: Secondary | ICD-10-CM | POA: Diagnosis not present

## 2022-05-02 DIAGNOSIS — H16223 Keratoconjunctivitis sicca, not specified as Sjogren's, bilateral: Secondary | ICD-10-CM | POA: Diagnosis not present

## 2022-05-02 DIAGNOSIS — Z7984 Long term (current) use of oral hypoglycemic drugs: Secondary | ICD-10-CM | POA: Diagnosis not present

## 2022-05-02 DIAGNOSIS — H16401 Unspecified corneal neovascularization, right eye: Secondary | ICD-10-CM | POA: Diagnosis not present

## 2022-05-02 DIAGNOSIS — N183 Chronic kidney disease, stage 3 unspecified: Secondary | ICD-10-CM | POA: Diagnosis not present

## 2022-05-02 HISTORY — PX: OTHER SURGICAL HISTORY: SHX169

## 2022-05-08 DIAGNOSIS — Z09 Encounter for follow-up examination after completed treatment for conditions other than malignant neoplasm: Secondary | ICD-10-CM | POA: Diagnosis not present

## 2022-05-08 DIAGNOSIS — H02115 Cicatricial ectropion of left lower eyelid: Secondary | ICD-10-CM | POA: Diagnosis not present

## 2022-05-08 DIAGNOSIS — H01112 Allergic dermatitis of right lower eyelid: Secondary | ICD-10-CM | POA: Diagnosis not present

## 2022-05-08 DIAGNOSIS — H01115 Allergic dermatitis of left lower eyelid: Secondary | ICD-10-CM | POA: Diagnosis not present

## 2022-05-08 DIAGNOSIS — H02112 Cicatricial ectropion of right lower eyelid: Secondary | ICD-10-CM | POA: Diagnosis not present

## 2022-05-08 DIAGNOSIS — L121 Cicatricial pemphigoid: Secondary | ICD-10-CM | POA: Diagnosis not present

## 2022-05-09 ENCOUNTER — Ambulatory Visit: Payer: Medicare Other | Admitting: Internal Medicine

## 2022-05-16 ENCOUNTER — Ambulatory Visit: Payer: Medicare Other | Admitting: Internal Medicine

## 2022-05-17 ENCOUNTER — Ambulatory Visit: Payer: Medicare Other | Admitting: Internal Medicine

## 2022-05-30 DIAGNOSIS — Z9889 Other specified postprocedural states: Secondary | ICD-10-CM | POA: Diagnosis not present

## 2022-05-31 ENCOUNTER — Encounter: Payer: Self-pay | Admitting: Neurology

## 2022-05-31 ENCOUNTER — Encounter: Payer: Self-pay | Admitting: Internal Medicine

## 2022-05-31 ENCOUNTER — Ambulatory Visit (INDEPENDENT_AMBULATORY_CARE_PROVIDER_SITE_OTHER): Payer: Medicare Other | Admitting: Internal Medicine

## 2022-05-31 ENCOUNTER — Ambulatory Visit: Payer: Medicare Other | Admitting: Neurology

## 2022-05-31 VITALS — BP 124/70 | HR 65 | Temp 97.8°F | Ht 67.0 in | Wt 137.0 lb

## 2022-05-31 VITALS — BP 122/59 | HR 80 | Ht 67.0 in | Wt 136.0 lb

## 2022-05-31 DIAGNOSIS — E785 Hyperlipidemia, unspecified: Secondary | ICD-10-CM | POA: Diagnosis not present

## 2022-05-31 DIAGNOSIS — G44019 Episodic cluster headache, not intractable: Secondary | ICD-10-CM | POA: Diagnosis not present

## 2022-05-31 DIAGNOSIS — L121 Cicatricial pemphigoid: Secondary | ICD-10-CM | POA: Diagnosis not present

## 2022-05-31 DIAGNOSIS — E119 Type 2 diabetes mellitus without complications: Secondary | ICD-10-CM | POA: Diagnosis not present

## 2022-05-31 DIAGNOSIS — H918X3 Other specified hearing loss, bilateral: Secondary | ICD-10-CM

## 2022-05-31 DIAGNOSIS — H9190 Unspecified hearing loss, unspecified ear: Secondary | ICD-10-CM | POA: Insufficient documentation

## 2022-05-31 DIAGNOSIS — R7309 Other abnormal glucose: Secondary | ICD-10-CM | POA: Diagnosis not present

## 2022-05-31 MED ORDER — HYDROCODONE-ACETAMINOPHEN 7.5-325 MG PO TABS: 1.0000 | ORAL_TABLET | Freq: Four times a day (QID) | ORAL | 0 refills | Status: DC | PRN

## 2022-05-31 NOTE — Assessment & Plan Note (Addendum)
Vision is a little better after recent surgery on the R eye Norco renewed for pain control  Potential benefits of a long term opioids use as well as potential risks (i.e. addiction risk, apnea etc) and complications (i.e. Somnolence, constipation and others) were explained to the patient and were aknowledged.

## 2022-05-31 NOTE — Progress Notes (Signed)
Subjective:  Patient ID: Louis Kemp, male    DOB: [birth year]  Age: 81 y.o. MRN: [id number]  CC: 3 month f/u (No concerns. Recent eye surgery. He would like renewal of hydrocodone due to his recent surgery)   HPI Louis Kemp presents for Vit B12 def, eye pain/HA, R eye - better after surgery  Outpatient Medications Prior to Visit  Medication Sig Dispense Refill   Cholecalciferol (VITAMIN D) 2000 UNITS CAPS Take 1 capsule by mouth every other day.     cyanocobalamin (,VITAMIN B-12,) 1000 MCG/ML injection INJECT 1 ML INTO THE MUSCLE EVERY 14 DAYS 30 mL 3   escitalopram (LEXAPRO) 5 MG tablet Take 1 tablet (5 mg total) by mouth at bedtime. 90 tablet 1   fenofibrate 160 MG tablet Take 0.5 tablets (80 mg total) by mouth daily. 90 tablet 3   Galcanezumab-gnlm (EMGALITY, 300 MG DOSE,) 100 MG/ML SOSY Inject 300 mg (3 pens) into the skin every 30 days during cluster headache cycle and stop when cycle is over. 3 mL 11   metFORMIN (GLUCOPHAGE) 500 MG tablet TAKE ONE TABLET BY MOUTH THREE TIMES DAILY 270 tablet 1   tamsulosin (FLOMAX) 0.4 MG CAPS capsule Take 1 capsule (0.4 mg total) by mouth daily. 90 capsule 3   Ubrogepant (UBRELVY) 100 MG TABS Take 100 mg by mouth every 2 (two) hours as needed. Maximum '200mg'$  a day. 4 tablet 0   verapamil (CALAN-SR) 240 MG CR tablet Take 1 tablet (240 mg total) by mouth at bedtime. Please stop amlodipine. 90 tablet 3   HYDROcodone-acetaminophen (NORCO) 7.5-325 MG tablet Take 1-2 tablets by mouth every 6 (six) hours as needed for moderate pain. (Patient not taking: Reported on 05/31/2022) 100 tablet 0   lidocaine (LIDODERM) 5 % Place 1 patch onto the skin daily. Remove & Discard patch within 12 hours or as directed by MD (Patient not taking: Reported on 05/31/2022) 30 patch 11   NONFORMULARY OR COMPOUNDED ITEM 4% Lidocaine nasal spray. Directions: Place 2 sprays into right nostril every 1 hour as needed for headache. (Patient not taking: Reported on 05/31/2022) 1 each 2    SUMAtriptan Succinate (ZEMBRACE SYMTOUCH) 3 MG/0.5ML SOAJ Inject 3 mg into the skin every 15 (fifteen) minutes for 1 dose. May repeat in 15 minutes. If symptoms persist, repeat in 2 hours. Max 4 injections daily. 2 mL 0   No facility-administered medications prior to visit.    ROS: Review of Systems  Objective:  BP 124/70   Pulse 65   Temp 97.8 F (36.6 C) (Oral)   Ht '5\' 7"'$  (1.702 m)   Wt 137 lb (62.1 kg)   SpO2 96%   BMI 21.46 kg/m   BP Readings from Last 3 Encounters:  05/31/22 124/70  05/31/22 (!) 122/59  02/09/22 123/60    Wt Readings from Last 3 Encounters:  05/31/22 137 lb (62.1 kg)  05/31/22 136 lb (61.7 kg)  02/09/22 135 lb (61.2 kg)    Physical Exam  Lab Results  Component Value Date   WBC 6.7 02/08/2022   HGB 12.7 (L) 02/08/2022   HCT 37.2 (L) 02/08/2022   PLT 348 02/08/2022   GLUCOSE 118 (H) 02/07/2022   CHOL 160 11/10/2020   TRIG 139.0 11/10/2020   HDL 38.20 (L) 11/10/2020   LDLCALC 94 11/10/2020   ALT 21 02/07/2022   AST 32 02/07/2022   NA 137 02/07/2022   K 4.2 02/07/2022   CL 103 02/07/2022   CREATININE 1.57 (H) 02/07/2022  BUN 20 02/07/2022   CO2 26 02/07/2022   TSH 0.78 02/07/2022   PSA 0.63 11/10/2020   INR 1.01 09/03/2014   HGBA1C 5.9 02/07/2022   MICROALBUR 1.8 11/10/2020    CT ORBITS W CONTRAST  Result Date: 06/20/2021 CLINICAL DATA:  Uveitis with periorbital cellulitis EXAM: CT ORBITS WITH CONTRAST TECHNIQUE: Multidetector CT images was performed according to the standard protocol following intravenous contrast administration. CONTRAST:  46m ISOVUE-300 IOPAMIDOL (ISOVUE-300) INJECTION 61% COMPARISON:  No pertinent prior exam. FINDINGS: Orbits: Bilateral cataract extraction. Normal globe bilaterally. No orbital mass edema or abscess. Mild soft tissue thickening of the right upper eyelid. Normal left eyelid. Visible paranasal sinuses: Mild mucosal edema left maxillary sinus. Remaining sinuses clear. Soft tissues: Mild soft tissue  thickening right inferior eyelid. Otherwise no soft tissue edema, mass, or fluid collection Osseous: Negative Limited intracranial: Negative IMPRESSION: Mild mucosal edema right inferior eyelid which may be due to periorbital cellulitis. No orbital mass or abscess. Electronically Signed   By: CFranchot GalloM.D.   On: 06/20/2021 15:15    Assessment & Plan:   Problem List Items Addressed This Visit     Diabetes type 2, controlled ([facility] - Primary   Dyslipidemia   Relevant Orders   TSH   CBC with Differential/Platelet   Comprehensive metabolic panel   Lipid panel   Hearing loss    2023       HYPERGLYCEMIA   Relevant Orders   Comprehensive metabolic panel   Hemoglobin A1c   Ocular pemphigoid    Vision is a little better after recent surgery on the R eye Norco renewed for pain control  Potential benefits of a long term opioids use as well as potential risks (i.e. addiction risk, apnea etc) and complications (i.e. Somnolence, constipation and others) were explained to the patient and were aknowledged.        Relevant Orders   TSH   CBC with Differential/Platelet   Comprehensive metabolic panel   Lipid panel   Hemoglobin A1c      Meds ordered this encounter  Medications   HYDROcodone-acetaminophen (NORCO) 7.5-325 MG tablet    Sig: Take 1-2 tablets by mouth every 6 (six) hours as needed for moderate pain.    Dispense:  60 tablet    Refill:  0      Follow-up: Return in about 3 months (around 08/31/2022) for a follow-up visit.  AWalker Kehr MD

## 2022-05-31 NOTE — Progress Notes (Signed)
[facility]    Provider:  Dr Jaynee Eagles Requesting Provider: [name], Evie Lacks, MD Primary Care Provider:  Cassandria Anger, MD  CC:  right cluster headache  He is improved, he had eye surgery on the cornea and he is feeling better with his vision. Light doesn't bother him anymore. They saw Dr. Sharen Counter yesterday. He has not had a headache in 3 months. He is having hearing problems, his left ear is worsening, I am not awareof any ototoxicity with rituximab will ave him discuss with Dr. Sharen Counter at [facility]. His wife is here and provides information. He tried to go down to 2 emgality and had a cluster headache, went back to 3.  Patient complains of symptoms per HPI as well as the following symptoms: left ear decreased hearing . Pertinent negatives and positives per HPI. All others negative   02/09/2022: Tremendous improvement on emgality from daily severe multiple episodes of cluster headaches to 2x a month. Still having the headaches twice a month. It may last for 6 hours or 8 hours. We discussed options, continue taking Emgality, his vision is better slightly, he continues on Rituximab in out office and we monitor his labs and send them to Dr. Sharen Counter ophthalmology at [facility]. We discussed acute management when he does have headaches, discussed options and decided: At onset of headache try Ubrelvy with norco at onset of headache. Can repeat ubrelvy 2 hours later if needed Can also try injectable Zembrace with the norco: Take one injection every 15 minutes if needed max 4 in one day  10/10/2021: He is doing great, just a few headaches, doing well on verapamil and emgality, he had some corneal scarring and he had aprocedure which is helping. Follow up phone visit in 3 months  Interval history: Patient her etoday, he is feeling much better. We have him on verapamil and have given him Emaglity cluster headache dose (today will be the second dose). Lidocaine spray, oxygen, nerve block,  sphenopalatine blocks did not last.   Interval history: Patient here for intractable episodic cluster headaches in the setting of very rare ocular pemphigoid.  He has intractable periorbital pain in the right with unilateral lacrimation and rhinorrhea.  Initially thought this might be a secondary headache from the very rare ocular pemphigoid, but at this time it appears he has concomitant cluster headaches; the rarity of ocular pemphigoid in addition with cluster headaches is quite unusual.  Today he has ptosis, likely from botox. Botox x 2 has not helped.  We have tried oral triptans, Nurtec, indomethacin, and none have relieved his symptoms.  Indomethacin was titrated up to 150 mg a day does not appear to be an indomethacin responsive headache but can try going up to 225 mg a day.  We have tried him on Topamax, Zonegran, high-dose steroids for over a month.  We have discontinued his amlodipine and carvedilol and we started verapamil.  Today we also injected him with Emgality.  I performed nerve blocks as well as sphenopalatine ganglion block.  Our plan is to do the following:  Interval history: The patient describes a headache primarily as a constant boring pain.  Certainly, the possibility of hemicrania continua does need to be considered or Cluster headache.  A trial indomethacin(or verapamil or Emgality) may be of some diagnostic benefit. He did not tolerate topamax but may have helped, neither tolerated zonegran, we have tried botox and will repeat, he declines depakote. Will titrate him off of prenisone and try Indomethacin. If no  benefit will try Emgality or Verapamil.   Physially he is better, more energy, still having some redness of the right eye but maybe slightly improved, vision has not improved, Stopped the cellcept and on the rituximab.   Interval history: The pain is on the right temple, it happens in the afternoon or overnight about the same time every day, right eye waters(both eyes  water), other wise headache wakes him up if he does not take medication, his blood pressure has been a little low he was feeling weak and it was 212 systolic. Taking the amlodipine at 6pm, taking the carvedilol 6-7a, 10pm. BP 108/77 and tachycardic. He took gatorade yesterday and he was better but this morning again his BP was a little low. We decided to do nerve blocks today. We discussed topamax, CGRP meds, unfrotunately difficult to assess and treat unclear etiology of headaches - associated with his pemphigoid eye disease or is this an independent headache syndrome with associated autonomic symptoms? If no improvement with rituximab this Thursday we will change course.   Interval history 01/24/2021: I spoke to patient's [place] physician, we are going to try and get infusion here in the office. He also continues on Prednisone. Temple pain/headache is 85% better but he still has pain supraorbitally. Only at night or in the afternoon. But he is very improved. We discussed titrating the steroids and we will stay on 72m for one month then slowly decrease by 129mmonthly. We discussed checking hgba1c and his glucose, I recommended he check his glucose daily, he is feeling well, no problems, his eyes feel much better and are not as red or painful. We received some notes from Dr. PeSharen Counterbut no orders, I have contacted him to get specifics on the pre-treatment, the injection doses and any labwork he wants prior to infusions. He is taking omeprazole daily, taking steroids with food as well, no GI upset.   HPI:  Louis COUPERs a [age].o. male here as requested by [name], AlEvie LacksMD for headaches. PMHx ocular pemphigoid, B12 deficiency, HTN, PAC, DM2. New onset right temporal headache, continuous, sever at times.  Headaches started 2 weeks ago, it wasn't that bad but it is worsening and now can be severe, it is in the temple and radiates to the temple and frontal area. It becomes quite severe 7-8/10. Sharp pain,  shooting pain, he had prior headache in the left eye which was not similar it was ocular pain his eye was so inflamed due to the Pemphigoid. This is not the same, this is new, and is not anything he has ever experienced. He has noticed in the last 4-5 days when he eats he has jaw pain. The right eye vision is worsening. No significant history of migraines. No other significant history of headaches, he had one migraine in the remote past and this is nothing like it. The pain is continuous, no known triggers. Alleve is not working. Had a long discussion with patient regarding possiibilites and differential. No other focal neurologic deficits, associated symptoms, inciting events or modifiable factors.  Reviewed notes, labs and imaging from outside physicians, which showed:  Personally reviewed imaging with Dr. PrMamie Nickethi(Neuroradiology) and agree with the following:mild T2 hyperintensities, mild cerebral atrophy, otherwise unremarkable for age.   11/10/2020: TSH nml, B12 1056, cbc unremarkable, CMP elevated glucose and reduced GFR 52 otherwise normal Review of Systems: Patient complains of symptoms per HPI as well as the following symptoms: headache, vision changes . Pertinent negatives  and positives per HPI. All others negative    Social History   Socioeconomic History   Marital status: Married    Spouse name: [name]   Number of children: Not on file   Years of education: Not on file   Highest education level: Not on file  Occupational History   Occupation: MD Retired  Tobacco Use   Smoking status: Former    Types: Cigars   Smokeless tobacco: Never   Tobacco comments:    3-4 cigars per year in the past but none in the past 10 years  Vaping Use   Vaping Use: Never used  Substance and Sexual Activity   Alcohol use: Not Currently    Alcohol/week: 0.0 - 1.0 standard drinks    Comment: maybe 1 mixed drink maybe once a month   Drug use: No   Sexual activity: Not Currently  Other Topics  Concern   Not on file  Social History Narrative   Regular Exercise -  YES, golf   Lives at home with wife    Right handed   Caffeine:  1-2 cups/day   Social Determinants of Health   Financial Resource Strain: Not on file  Food Insecurity: Not on file  Transportation Needs: Not on file  Physical Activity: Not on file  Stress: Not on file  Social Connections: Not on file  Intimate Partner Violence: Not on file    Family History  Problem Relation Age of Onset   Arthritis Mother    Hypertension Father    Coronary artery disease Father    Hypertension Brother    Hypertension Other    Hypertension Brother    Coronary artery disease Other     Past Medical History:  Diagnosis Date   BPH (benign prostatic hypertrophy)    Hyperlipidemia    Hypertension    LBP (low back pain)    MVP (mitral valve prolapse)    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   on cellcept   Osteoarthritis    PAC (premature atrial contraction)    Statin intolerance    Type II or unspecified type diabetes mellitus without mention of complication, not stated as uncontrolled 2011   Vitamin B 12 deficiency 2011   Vitamin D deficiency    Vitiligo     Patient Active Problem List   Diagnosis Date Noted   Hearing loss 05/31/2022   Malnutrition ([place]) 06/23/2021   Episodic cluster headache, not intractable 06/09/2021   Ocular pemphigoid 05/19/2021   Chronic sinusitis 05/12/2021   Depression 05/12/2021   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   Right sided temporal headache 01/06/2021   Chronic renal insufficiency, stage 3 (moderate) ([place]) 05/11/2020   Pemphigoid 05/10/2020   Elevated serum creatinine 07/11/2018   Hip pain, acute, right 01/10/2018   Postoperative anemia due to acute blood loss 09/30/2014   Status post left partial knee replacement 09/15/2014   S/P left UKR 09/14/2014   S/P right TKA 06/15/2014   Osteoarthritis, knee 11/24/2013   Weight loss 06/24/2013   PAC (premature  atrial contraction) 01/13/2013   Heart murmur 01/13/2013   Well adult exam 11/03/2012   Paresthesia 10/22/2012   Hypertension 04/18/2012   Diabetes type 2, controlled ([place]) 10/20/2010   B12 deficiency 10/20/2010   Vitamin D deficiency 10/20/2010   Dyslipidemia 07/14/2010   BENIGN PROSTATIC HYPERTROPHY 07/14/2010   OSTEOARTHRITIS 07/14/2010   LOW BACK PAIN 07/14/2010   HYPERGLYCEMIA 07/14/2010    Past Surgical History:  Procedure Laterality Date   arm surgery Left  ORIF left  forearm   CATARACT EXTRACTION  2012   EYE SURGERY     eye surgery Right 05/02/2022   remove scar on cornea and then put amniotic cells on it   eyelid surgery Right    HERNIA REPAIR Bilateral 1971   PARTIAL KNEE ARTHROPLASTY Left 09/14/2014   Procedure: LEFT UNICOMPARTMENTAL KNEE ARTHROPLASTY MEDIALLY;  Surgeon: Mauri Pole, MD;  Location: [facility];  Service: Orthopedics;  Laterality: Left;   TOTAL KNEE ARTHROPLASTY Right 06/15/2014   Procedure: RIGHT TOTAL KNEE ARTHROPLASTY;  Surgeon: Mauri Pole, MD;  Location: [facility];  Service: Orthopedics;  Laterality: Right;    Current Outpatient Medications  Medication Sig Dispense Refill   Cholecalciferol (VITAMIN D) 2000 UNITS CAPS Take 1 capsule by mouth every other day.     cyanocobalamin (,VITAMIN B-12,) 1000 MCG/ML injection INJECT 1 ML INTO THE MUSCLE EVERY 14 DAYS 30 mL 3   escitalopram (LEXAPRO) 5 MG tablet Take 1 tablet (5 mg total) by mouth at bedtime. 90 tablet 1   fenofibrate 160 MG tablet Take 0.5 tablets (80 mg total) by mouth daily. 90 tablet 3   Galcanezumab-gnlm (EMGALITY, 300 MG DOSE,) 100 MG/ML SOSY Inject 300 mg (3 pens) into the skin every 30 days during cluster headache cycle and stop when cycle is over. 3 mL 11   metFORMIN (GLUCOPHAGE) 500 MG tablet TAKE ONE TABLET BY MOUTH THREE TIMES DAILY 270 tablet 1   tamsulosin (FLOMAX) 0.4 MG CAPS capsule Take 1 capsule (0.4 mg total) by mouth daily. 90 capsule 3   Ubrogepant (UBRELVY) 100 MG TABS  Take 100 mg by mouth every 2 (two) hours as needed. Maximum 228m a day. 4 tablet 0   verapamil (CALAN-SR) 240 MG CR tablet Take 1 tablet (240 mg total) by mouth at bedtime. Please stop amlodipine. 90 tablet 3   HYDROcodone-acetaminophen (NORCO) 7.5-325 MG tablet Take 1-2 tablets by mouth every 6 (six) hours as needed for moderate pain. 60 tablet 0   No current facility-administered medications for this visit.    Allergies as of 05/31/2022 - Review Complete 05/31/2022  Allergen Reaction Noted   Statins  06/23/2021   Atorvastatin  10/20/2010   Rosuvastatin  10/20/2010   Zonisamide  05/12/2021    Vitals: BP (!) 122/59 (BP Location: Right Arm, Patient Position: Sitting)   Pulse 80   Ht 5' 7"  (1.702 m)   Wt 136 lb (61.7 kg)   BMI 21.30 kg/m  Last Weight:  Wt Readings from Last 1 Encounters:  05/31/22 137 lb (62.1 kg)   Last Height:   Ht Readings from Last 1 Encounters:  05/31/22 5' 7"  (1.702 m)     Exam: NAD, pleasant                  Speech:    Speech is normal; fluent and spontaneous with normal comprehension.  Cognition:    The patient is oriented to person, place, and time;     recent and remote memory intact;     language fluent;    Cranial Nerves:    The pupils are equal, round, and reactive to light.Trigeminal sensation is intact and the muscles of mastication are normal. The face is symmetric. The palate elevates in the midline. Hearing intact. Voice is normal. Shoulder shrug is normal. The tongue has normal motion without fasciculations.   Coordination:  No dysmetria  Motor Observation:    No asymmetry, no atrophy, and no involuntary movements noted. Tone:  Normal muscle tone.     Strength:    Strength is V/V in the upper and lower limbs.      Sensation: intact to LT      Assessment/Plan:   81 y.o. male here as requested by [name], Evie Lacks, MD for headaches. PMHx ocular pemphigoid, B12 deficiency, HTN, PAC, DM2. New onset right temporal headache  in the setting of pemphigoid Autoimmune eye disease), continuous, severe at times, no signifcant improvement with steroids. Unfortunately difficult to assess and treat unclear etiology of headaches - associated with his pemphigoid eye disease or is this an independent headache syndrome with associated autonomic symptoms? MRI of the of the brain and carotid ultrasound were completed without etiology.   Doing great on Emgality continue Continue: Rituximab and continue to follow with [facility]. For ocular pemphigoid    PRIOR ASSESSMENT AND PLANS: Better with verapamil and Emgality. Now on Verapamil 210m a day (cannot tolerate higher) for cluster headache dosage. Appears to be responding to cluster headache treatment excellent. Tremendous improvement on emgality from daily severe multiple episodes of cluster headaches to 2x a month. Still having the headaches twice a month. It may last for 6 hours or 8 hours.  We discussed acute management when he does have headaches, discussed options and decided: At onset of headache try Ubrelvy with norco at onset of headache. Can repeat ubrelvy 2 hours later if needed, Can also try injectable Zembrace with the norco: Take one injection every 15 minutes if needed max 4 in one day gave samples  Nerve blocks, sphenopalatine blocks,Indomethacin, lidocaine nasal spray did not help. He had side effects to imitrex. He did not tolerate topamax but may have helped, neither tolerated zonegran. Botox did not help, nurtec did not help, he declined depakote, , high dose prednisone did not help significantly. has resolved ptosis from last botox injections. oxygen 15 L high flow for acute events did not help, will cancel order  MRI of the brain was negative as were carotid dopplers.  Ordered CT of the orbits which showed swelling underneath the right eyelid suggestive of orbital cellulitis,finished Keflex 5045mbid start 7 days still swollen  Continue: Rituximab and  continue to follow with [facility]. For ocular pemphigoid   PRIOR Assessment and plan:   [place] try and get infusion here in the office. He also continues on Prednisone 6033mTemple pain/headache is 85% better but he still has pain supraorbitally however only at night or in the afternoon. But he is very improved. We discussed titrating the steroids and we will stay on 24m64mr one month then slowly decrease by 10mg48mthly. We discussed checking hgba1c and his glucose, I recommended he check his glucose daily, he is feeling well, no problems, his eyes feel much better and are not as red or painful. We received some notes from Dr. PerezSharen Counter no orders, I have contacted him to get specifics on Rituximab including the pre-treatment, the injection doses and any labwork he wants prior to infusions. He is taking omeprazole daily, taking steroids with food as well, no GI upset.  Unclear etiology of headache but it has to be inflammatory/vascular or somehow related to the ocular pemphigoid, I doubt he would develop migraines or cluster headache at the age of 79 bu64trigeminal autonomic cephalalgias in the differential. Still we spoke about trying Depakote or other migraine medication(I do not think this is a migraine but it still may benefit from  these medications). We decided to stay with the prednisone as it is helping after only 2 weeks and can consider other medications if needed (he is not enthusiastic about topamax or gabapentin, nurtec helped a little, wonder if qulipta would be helpful or depakote)  PRIOR Patient is on cellcept so I would think Temporal Arteritis(GCA) less likely however he has a new right temporal headache (he has no significant headache history) over the last 2 weeks with jaw symptoms and worsening vision. I am going to treat him for GCA and order esr/crp however not sure they will be reliable. Will order Ultra Sound of the temporal  arteries, if esr/crp elevated we can discuss a biopsy but may be elevated due to his current inflammatory eye disease. I have contacted his ocular immunologist Dr. Sharen Counter.   (Addendum: ESR 31(ULN 30), CRP 13(ULN 10), temporal artery ultrasound without evidence of halo sign)  Severiano Gilbert, MD ocular immunologist [phone]        Cc: [name], Evie Lacks, MD,  Severiano Gilbert, MD ocular immunologist [phone]  Sarina Ill, MD  [facility] [address] [place], [place] [address]  Phone [phone] Fax [phone]  I spent 25 minutes of face-to-face and non-face-to-face time with patient on the  1. Episodic cluster headache, not intractable    diagnosis.  This included previsit chart review, lab review, study review, order entry, electronic health record documentation, patient education on the different diagnostic and therapeutic options, counseling and coordination of care, risks and benefits of management, compliance, or risk factor reduction

## 2022-05-31 NOTE — Assessment & Plan Note (Signed)
.  2023

## 2022-06-01 LAB — COMPREHENSIVE METABOLIC PANEL
ALT: 22 U/L (ref 0–53)
AST: 31 U/L (ref 0–37)
Albumin: 4.3 g/dL (ref 3.5–5.2)
Alkaline Phosphatase: 67 U/L (ref 39–117)
BUN: 24 mg/dL — ABNORMAL HIGH (ref 6–23)
CO2: 32 mEq/L (ref 19–32)
Calcium: 10.2 mg/dL (ref 8.4–10.5)
Chloride: 101 mEq/L (ref 96–112)
Creatinine, Ser: 1.57 mg/dL — ABNORMAL HIGH (ref 0.40–1.50)
GFR: 41.47 mL/min — ABNORMAL LOW (ref >60.00)
Glucose, Bld: 97 mg/dL (ref 70–99)
Potassium: 4.3 mEq/L (ref 3.5–5.1)
Sodium: 139 mEq/L (ref 135–145)
Total Bilirubin: 0.4 mg/dL (ref 0.2–1.2)
Total Protein: 6.8 g/dL (ref 6.0–8.3)

## 2022-06-01 LAB — CBC WITH DIFFERENTIAL/PLATELET
Basophils Absolute: 0.1 10*3/uL (ref 0.0–0.1)
Basophils Relative: 0.8 % (ref 0.0–3.0)
Eosinophils Absolute: 0.2 10*3/uL (ref 0.0–0.7)
Eosinophils Relative: 3.1 % (ref 0.0–5.0)
HCT: 38.8 % — ABNORMAL LOW (ref 39.0–52.0)
Hemoglobin: 12.9 g/dL — ABNORMAL LOW (ref 13.0–17.0)
Lymphocytes Relative: 49.3 % — ABNORMAL HIGH (ref 12.0–46.0)
Lymphs Abs: 3.5 10*3/uL (ref 0.7–4.0)
MCHC: 33.3 g/dL (ref 30.0–36.0)
MCV: 87.7 fl (ref 78.0–100.0)
Monocytes Absolute: 0.8 10*3/uL (ref 0.1–1.0)
Monocytes Relative: 11.8 % (ref 3.0–12.0)
Neutro Abs: 2.5 10*3/uL (ref 1.4–7.7)
Neutrophils Relative %: 35 % — ABNORMAL LOW (ref 43.0–77.0)
Platelets: 331 10*3/uL (ref 150.0–400.0)
RBC: 4.42 Mil/uL (ref 4.22–5.81)
RDW: 15.3 % (ref 11.5–15.5)
WBC: 7.2 10*3/uL (ref 4.0–10.5)

## 2022-06-01 LAB — LIPID PANEL
Cholesterol: 161 mg/dL (ref 0–200)
HDL: 54.8 mg/dL (ref >39.00)
LDL Cholesterol: 85 mg/dL (ref 0–99)
NonHDL: 106.17
Total CHOL/HDL Ratio: 3
Triglycerides: 108 mg/dL (ref 0.0–149.0)
VLDL: 21.6 mg/dL (ref 0.0–40.0)

## 2022-06-01 LAB — HEMOGLOBIN A1C: Hgb A1c MFr Bld: 5.9 % (ref 4.6–6.5)

## 2022-06-01 LAB — TSH: TSH: 1.1 u[IU]/mL (ref 0.35–5.50)

## 2022-06-05 DIAGNOSIS — H02812 Retained foreign body in right lower eyelid: Secondary | ICD-10-CM | POA: Diagnosis not present

## 2022-06-05 DIAGNOSIS — H04561 Stenosis of right lacrimal punctum: Secondary | ICD-10-CM | POA: Diagnosis not present

## 2022-06-05 DIAGNOSIS — H02132 Senile ectropion of right lower eyelid: Secondary | ICD-10-CM | POA: Diagnosis not present

## 2022-06-05 DIAGNOSIS — H02532 Eyelid retraction right lower eyelid: Secondary | ICD-10-CM | POA: Diagnosis not present

## 2022-06-05 DIAGNOSIS — H11821 Conjunctivochalasis, right eye: Secondary | ICD-10-CM | POA: Diagnosis not present

## 2022-06-05 DIAGNOSIS — H01112 Allergic dermatitis of right lower eyelid: Secondary | ICD-10-CM | POA: Diagnosis not present

## 2022-06-05 DIAGNOSIS — H01115 Allergic dermatitis of left lower eyelid: Secondary | ICD-10-CM | POA: Diagnosis not present

## 2022-06-05 DIAGNOSIS — L121 Cicatricial pemphigoid: Secondary | ICD-10-CM | POA: Diagnosis not present

## 2022-06-05 DIAGNOSIS — H02112 Cicatricial ectropion of right lower eyelid: Secondary | ICD-10-CM | POA: Diagnosis not present

## 2022-06-15 DIAGNOSIS — G44009 Cluster headache syndrome, unspecified, not intractable: Secondary | ICD-10-CM | POA: Diagnosis not present

## 2022-06-15 DIAGNOSIS — H02051 Trichiasis without entropian right upper eyelid: Secondary | ICD-10-CM | POA: Diagnosis not present

## 2022-07-11 DIAGNOSIS — H16103 Unspecified superficial keratitis, bilateral: Secondary | ICD-10-CM | POA: Diagnosis not present

## 2022-07-11 DIAGNOSIS — H16423 Pannus (corneal), bilateral: Secondary | ICD-10-CM | POA: Diagnosis not present

## 2022-07-11 DIAGNOSIS — H5711 Ocular pain, right eye: Secondary | ICD-10-CM | POA: Diagnosis not present

## 2022-07-11 DIAGNOSIS — Z947 Corneal transplant status: Secondary | ICD-10-CM | POA: Diagnosis not present

## 2022-07-11 DIAGNOSIS — H11233 Symblepharon, bilateral: Secondary | ICD-10-CM | POA: Diagnosis not present

## 2022-07-14 DIAGNOSIS — H9113 Presbycusis, bilateral: Secondary | ICD-10-CM | POA: Insufficient documentation

## 2022-07-14 DIAGNOSIS — H903 Sensorineural hearing loss, bilateral: Secondary | ICD-10-CM | POA: Diagnosis not present

## 2022-07-17 DIAGNOSIS — H903 Sensorineural hearing loss, bilateral: Secondary | ICD-10-CM | POA: Diagnosis not present

## 2022-07-18 DIAGNOSIS — H5711 Ocular pain, right eye: Secondary | ICD-10-CM | POA: Diagnosis not present

## 2022-07-18 DIAGNOSIS — Z947 Corneal transplant status: Secondary | ICD-10-CM | POA: Diagnosis not present

## 2022-07-18 DIAGNOSIS — H16423 Pannus (corneal), bilateral: Secondary | ICD-10-CM | POA: Diagnosis not present

## 2022-07-18 DIAGNOSIS — H02051 Trichiasis without entropian right upper eyelid: Secondary | ICD-10-CM | POA: Diagnosis not present

## 2022-07-19 ENCOUNTER — Other Ambulatory Visit: Payer: Self-pay | Admitting: *Deleted

## 2022-07-19 ENCOUNTER — Other Ambulatory Visit (INDEPENDENT_AMBULATORY_CARE_PROVIDER_SITE_OTHER): Payer: Self-pay

## 2022-07-19 DIAGNOSIS — Z9225 Personal history of immunosupression therapy: Secondary | ICD-10-CM | POA: Diagnosis not present

## 2022-07-19 DIAGNOSIS — Z79899 Other long term (current) drug therapy: Secondary | ICD-10-CM

## 2022-07-19 DIAGNOSIS — L121 Cicatricial pemphigoid: Secondary | ICD-10-CM | POA: Diagnosis not present

## 2022-07-19 DIAGNOSIS — Z0289 Encounter for other administrative examinations: Secondary | ICD-10-CM

## 2022-07-20 ENCOUNTER — Other Ambulatory Visit: Payer: Self-pay | Admitting: Otolaryngology

## 2022-07-20 DIAGNOSIS — H903 Sensorineural hearing loss, bilateral: Secondary | ICD-10-CM

## 2022-07-20 DIAGNOSIS — H9113 Presbycusis, bilateral: Secondary | ICD-10-CM

## 2022-07-20 LAB — T + B-LYMPHOCYTE DIFFERENTIAL
% CD 3 Pos. Lymph.: 88 % — ABNORMAL HIGH (ref 57.5–86.2)
% CD 4 Pos. Lymph.: 29.1 % — ABNORMAL LOW (ref 30.8–58.5)
Absolute CD 3: 3344 /uL — ABNORMAL HIGH (ref 622–2402)
Absolute CD 4 Helper: 1106 /uL (ref 359–1519)
Basophils Absolute: 0 10*3/uL (ref 0.0–0.2)
Basos: 0 %
CD19 % B Cell: 0 % — ABNORMAL LOW (ref 3.3–25.4)
CD19 Abs: 0 /uL — ABNORMAL LOW (ref 12–645)
CD4/CD8 Ratio: 0.49 — ABNORMAL LOW (ref 0.92–3.72)
CD8 % Suppressor T Cell: 59.3 % — ABNORMAL HIGH (ref 12.0–35.5)
CD8 T Cell Abs: 2253 /uL — ABNORMAL HIGH (ref 109–897)
EOS (ABSOLUTE): 0.2 10*3/uL (ref 0.0–0.4)
Eos: 2 %
Hematocrit: 40.4 % (ref 37.5–51.0)
Hemoglobin: 13.6 g/dL (ref 13.0–17.7)
Immature Grans (Abs): 0 10*3/uL (ref 0.0–0.1)
Immature Granulocytes: 0 %
Lymphocytes Absolute: 3.8 10*3/uL — ABNORMAL HIGH (ref 0.7–3.1)
Lymphs: 42 %
MCH: 29.2 pg (ref 26.6–33.0)
MCHC: 33.7 g/dL (ref 31.5–35.7)
MCV: 87 fL (ref 79–97)
Monocytes Absolute: 0.8 10*3/uL (ref 0.1–0.9)
Monocytes: 9 %
Neutrophils Absolute: 4.3 10*3/uL (ref 1.4–7.0)
Neutrophils: 47 %
Platelets: 372 10*3/uL (ref 150–450)
RBC: 4.65 x10E6/uL (ref 4.14–5.80)
RDW: 14.1 % (ref 11.6–15.4)
WBC: 9.2 10*3/uL (ref 3.4–10.8)

## 2022-07-20 LAB — COMPREHENSIVE METABOLIC PANEL
ALT: 21 IU/L (ref 0–44)
AST: 29 IU/L (ref 0–40)
Albumin/Globulin Ratio: 2 (ref 1.2–2.2)
Albumin: 4.5 g/dL (ref 3.8–4.8)
Alkaline Phosphatase: 85 IU/L (ref 44–121)
BUN/Creatinine Ratio: 15 (ref 10–24)
BUN: 21 mg/dL (ref 8–27)
Bilirubin Total: 0.3 mg/dL (ref 0.0–1.2)
CO2: 22 mmol/L (ref 20–29)
Calcium: 9.7 mg/dL (ref 8.6–10.2)
Chloride: 102 mmol/L (ref 96–106)
Creatinine, Ser: 1.44 mg/dL — ABNORMAL HIGH (ref 0.76–1.27)
Globulin, Total: 2.2 g/dL (ref 1.5–4.5)
Glucose: 129 mg/dL — ABNORMAL HIGH (ref 70–99)
Potassium: 4.3 mmol/L (ref 3.5–5.2)
Sodium: 140 mmol/L (ref 134–144)
Total Protein: 6.7 g/dL (ref 6.0–8.5)
eGFR: 49 mL/min/{1.73_m2} — ABNORMAL LOW (ref >59)

## 2022-07-24 ENCOUNTER — Telehealth: Payer: Self-pay | Admitting: Internal Medicine

## 2022-07-24 MED ORDER — DIAZEPAM 5 MG PO TABS: ORAL_TABLET | ORAL | 1 refills | Status: DC

## 2022-07-24 NOTE — Telephone Encounter (Signed)
Patient is requesting that a couple of valium be called in prior to his MRI on 8/6 - Please send this to [facility] in [facility] .  He states that he is extremely claustrophobic and he does not want to get up and run, lol

## 2022-07-24 NOTE — Telephone Encounter (Signed)
Okay.  Thanks.

## 2022-07-25 ENCOUNTER — Other Ambulatory Visit: Payer: Self-pay | Admitting: Internal Medicine

## 2022-07-27 DIAGNOSIS — Z9889 Other specified postprocedural states: Secondary | ICD-10-CM | POA: Diagnosis not present

## 2022-07-30 ENCOUNTER — Ambulatory Visit
Admission: RE | Admit: 2022-07-30 | Discharge: 2022-07-30 | Disposition: A | Discharge status: Home / Self Care | Payer: Medicare Other | Source: Ambulatory Visit | Attending: Otolaryngology | Admitting: Otolaryngology

## 2022-07-30 DIAGNOSIS — H9193 Unspecified hearing loss, bilateral: Secondary | ICD-10-CM | POA: Diagnosis not present

## 2022-07-30 DIAGNOSIS — H9113 Presbycusis, bilateral: Secondary | ICD-10-CM

## 2022-07-30 DIAGNOSIS — H903 Sensorineural hearing loss, bilateral: Secondary | ICD-10-CM

## 2022-07-30 DIAGNOSIS — J341 Cyst and mucocele of nose and nasal sinus: Secondary | ICD-10-CM | POA: Diagnosis not present

## 2022-07-30 DIAGNOSIS — I6782 Cerebral ischemia: Secondary | ICD-10-CM | POA: Diagnosis not present

## 2022-07-30 DIAGNOSIS — J329 Chronic sinusitis, unspecified: Secondary | ICD-10-CM | POA: Diagnosis not present

## 2022-07-30 MED ORDER — GADOBENATE DIMEGLUMINE 529 MG/ML IV SOLN
12.0000 mL | Freq: Once | INTRAVENOUS | Status: AC | PRN
  Administered 2022-07-30: 12 mL via INTRAVENOUS

## 2022-08-02 DIAGNOSIS — L121 Cicatricial pemphigoid: Secondary | ICD-10-CM | POA: Diagnosis not present

## 2022-08-10 ENCOUNTER — Telehealth: Payer: Self-pay | Admitting: *Deleted

## 2022-08-10 NOTE — Telephone Encounter (Signed)
Pt's [facility] opthalmologist Dr Ramonita Lab sent his note to Dr Jaynee Eagles recommending increasing the frequency of Rituxan to every 16 weeks from the current 24 week interval. Order form completed then signed by Dr Jaynee Eagles.  Order given to [facility] to process and schedule next infusion.

## 2022-08-15 DIAGNOSIS — H11233 Symblepharon, bilateral: Secondary | ICD-10-CM | POA: Diagnosis not present

## 2022-08-15 DIAGNOSIS — H02115 Cicatricial ectropion of left lower eyelid: Secondary | ICD-10-CM | POA: Diagnosis not present

## 2022-08-15 DIAGNOSIS — H52223 Regular astigmatism, bilateral: Secondary | ICD-10-CM | POA: Diagnosis not present

## 2022-08-15 DIAGNOSIS — H16423 Pannus (corneal), bilateral: Secondary | ICD-10-CM | POA: Diagnosis not present

## 2022-08-31 ENCOUNTER — Ambulatory Visit: Payer: Medicare Other | Admitting: Internal Medicine

## 2022-08-31 DIAGNOSIS — H1089 Other conjunctivitis: Secondary | ICD-10-CM | POA: Diagnosis not present

## 2022-08-31 DIAGNOSIS — H18891 Other specified disorders of cornea, right eye: Secondary | ICD-10-CM | POA: Diagnosis not present

## 2022-09-04 ENCOUNTER — Encounter: Payer: Self-pay | Admitting: Internal Medicine

## 2022-09-04 ENCOUNTER — Ambulatory Visit (INDEPENDENT_AMBULATORY_CARE_PROVIDER_SITE_OTHER): Payer: Medicare Other | Admitting: Internal Medicine

## 2022-09-04 VITALS — BP 128/62 | HR 65 | Temp 98.5°F | Ht 67.0 in | Wt 135.8 lb

## 2022-09-04 DIAGNOSIS — L121 Cicatricial pemphigoid: Secondary | ICD-10-CM

## 2022-09-04 DIAGNOSIS — E119 Type 2 diabetes mellitus without complications: Secondary | ICD-10-CM | POA: Diagnosis not present

## 2022-09-04 DIAGNOSIS — H918X3 Other specified hearing loss, bilateral: Secondary | ICD-10-CM | POA: Diagnosis not present

## 2022-09-04 DIAGNOSIS — Z23 Encounter for immunization: Secondary | ICD-10-CM | POA: Diagnosis not present

## 2022-09-04 LAB — COMPREHENSIVE METABOLIC PANEL
ALT: 17 U/L (ref 0–53)
AST: 26 U/L (ref 0–37)
Albumin: 4.3 g/dL (ref 3.5–5.2)
Alkaline Phosphatase: 62 U/L (ref 39–117)
BUN: 23 mg/dL (ref 6–23)
CO2: 27 mEq/L (ref 19–32)
Calcium: 10 mg/dL (ref 8.4–10.5)
Chloride: 103 mEq/L (ref 96–112)
Creatinine, Ser: 1.69 mg/dL — ABNORMAL HIGH (ref 0.40–1.50)
GFR: 37.89 mL/min — ABNORMAL LOW (ref >60.00)
Glucose, Bld: 107 mg/dL — ABNORMAL HIGH (ref 70–99)
Potassium: 4.2 mEq/L (ref 3.5–5.1)
Sodium: 139 mEq/L (ref 135–145)
Total Bilirubin: 0.5 mg/dL (ref 0.2–1.2)
Total Protein: 7.2 g/dL (ref 6.0–8.3)

## 2022-09-04 LAB — HEMOGLOBIN A1C: Hgb A1c MFr Bld: 6.1 % (ref 4.6–6.5)

## 2022-09-04 NOTE — Assessment & Plan Note (Signed)
2023 Dr Constance Holster S/p MRI Steroid injection is pending - L ear

## 2022-09-04 NOTE — Patient Instructions (Addendum)
Get a "smart speaker"- [employer], [employer] or [employer] GPT for emails etc?

## 2022-09-04 NOTE — Progress Notes (Signed)
Subjective:  Patient ID: Louis Kemp, male    DOB: [birth year]  Age: 81 y.o. MRN: [id number]  CC: Follow-up (3 month f/u- Flu shot)   HPI Dr Gay Filler presents for his eye condition, vision loss, hearing loss   Outpatient Medications Prior to Visit  Medication Sig Dispense Refill   Cholecalciferol (VITAMIN D) 2000 UNITS CAPS Take 1 capsule by mouth every other day.     cyanocobalamin (,VITAMIN B-12,) 1000 MCG/ML injection INJECT 1 ML INTO THE MUSCLE EVERY 14 DAYS 30 mL 3   diazepam (VALIUM) 5 MG tablet Take 5 mg 1 hour prior to MRI.  Repeat in 1-2 hours if needed. 4 tablet 1   escitalopram (LEXAPRO) 5 MG tablet Take 1 tablet (5 mg total) by mouth at bedtime. 90 tablet 1   fenofibrate 160 MG tablet Take 0.5 tablets (80 mg total) by mouth daily. 90 tablet 3   Galcanezumab-gnlm (EMGALITY, 300 MG DOSE,) 100 MG/ML SOSY Inject 300 mg (3 pens) into the skin every 30 days during cluster headache cycle and stop when cycle is over. 3 mL 11   HYDROcodone-acetaminophen (NORCO) 7.5-325 MG tablet Take 1-2 tablets by mouth every 6 (six) hours as needed for moderate pain. 60 tablet 0   metFORMIN (GLUCOPHAGE) 500 MG tablet TAKE ONE TABLET BY MOUTH THREE TIMES DAILY 270 tablet 1   riTUXimab-abbs (TRUXIMA) 100 MG/10ML injection Inject into the vein. Injection 3 times a year     tamsulosin (FLOMAX) 0.4 MG CAPS capsule TAKE ONE CAPSULE BY MOUTH DAILY 90 capsule 2   Ubrogepant (UBRELVY) 100 MG TABS Take 100 mg by mouth every 2 (two) hours as needed. Maximum '200mg'$  a day. 4 tablet 0   verapamil (CALAN-SR) 240 MG CR tablet Take 1 tablet (240 mg total) by mouth at bedtime. Please stop amlodipine. 90 tablet 2   No facility-administered medications prior to visit.    ROS: Review of Systems  Constitutional:  Negative for appetite change, fatigue and unexpected weight change.  HENT:  Negative for congestion, nosebleeds, sneezing, sore throat and trouble swallowing.   Eyes:  Negative for itching and visual  disturbance.  Respiratory:  Negative for cough.   Cardiovascular:  Negative for chest pain, palpitations and leg swelling.  Gastrointestinal:  Negative for abdominal distention, blood in stool, diarrhea and nausea.  Genitourinary:  Negative for frequency and hematuria.  Musculoskeletal:  Negative for back pain, gait problem, joint swelling and neck pain.  Skin:  Negative for color change and rash.  Neurological:  Negative for dizziness, tremors, speech difficulty and weakness.  Psychiatric/Behavioral:  Negative for agitation, dysphoric mood and sleep disturbance. The patient is not nervous/anxious.     Objective:  BP 128/62 (BP Location: Left Arm)   Pulse 65   Temp 98.5 F (36.9 C) (Oral)   Ht '5\' 7"'$  (1.702 m)   Wt 135 lb 12.8 oz (61.6 kg)   SpO2 97%   BMI 21.27 kg/m   BP Readings from Last 3 Encounters:  09/04/22 128/62  05/31/22 124/70  05/31/22 (!) 122/59    Wt Readings from Last 3 Encounters:  09/04/22 135 lb 12.8 oz (61.6 kg)  05/31/22 137 lb (62.1 kg)  05/31/22 136 lb (61.7 kg)    Physical Exam Constitutional:      General: He is not in acute distress.    Appearance: Normal appearance. He is well-developed.     Comments: NAD  Eyes:     Conjunctiva/sclera: Conjunctivae normal.     Pupils:  Pupils are equal, round, and reactive to light.  Neck:     Thyroid: No thyromegaly.     Vascular: No JVD.  Cardiovascular:     Rate and Rhythm: Normal rate and regular rhythm.     Heart sounds: Normal heart sounds. No murmur heard.    No friction rub. No gallop.  Pulmonary:     Effort: Pulmonary effort is normal. No respiratory distress.     Breath sounds: Normal breath sounds. No wheezing or rales.  Chest:     Chest wall: No tenderness.  Abdominal:     General: Bowel sounds are normal. There is no distension.     Palpations: Abdomen is soft. There is no mass.     Tenderness: There is no abdominal tenderness. There is no guarding or rebound.  Musculoskeletal:         General: No tenderness. Normal range of motion.     Cervical back: Normal range of motion.  Lymphadenopathy:     Cervical: No cervical adenopathy.  Skin:    General: Skin is warm and dry.     Findings: No rash.  Neurological:     Mental Status: He is alert and oriented to person, place, and time.     Cranial Nerves: No cranial nerve deficit.     Motor: No abnormal muscle tone.     Coordination: Coordination normal.     Gait: Gait normal.     Deep Tendon Reflexes: Reflexes are normal and symmetric.  Psychiatric:        Behavior: Behavior normal.        Thought Content: Thought content normal.        Judgment: Judgment normal.   Visually impaired Hard hearing  Lab Results  Component Value Date   WBC 9.2 07/19/2022   HGB 13.6 07/19/2022   HCT 40.4 07/19/2022   PLT 372 07/19/2022   GLUCOSE 129 (H) 07/19/2022   CHOL 161 05/31/2022   TRIG 108.0 05/31/2022   HDL 54.80 05/31/2022   LDLCALC 85 05/31/2022   ALT 21 07/19/2022   AST 29 07/19/2022   NA 140 07/19/2022   K 4.3 07/19/2022   CL 102 07/19/2022   CREATININE 1.44 (H) 07/19/2022   BUN 21 07/19/2022   CO2 22 07/19/2022   TSH 1.10 05/31/2022   PSA 0.63 11/10/2020   INR 1.01 09/03/2014   HGBA1C 5.9 05/31/2022   MICROALBUR 1.8 11/10/2020    MR BRAIN/IAC W WO CONTRAST  Result Date: 07/31/2022 CLINICAL DATA:  Provided history: Hearing loss. Additional history provided by scanning technologist: Patient reports left greater than right hearing loss for 6 months. Blurred vision. EXAM: MRI HEAD WITHOUT AND WITH CONTRAST TECHNIQUE: Multiplanar, multiecho pulse sequences of the brain and surrounding structures were obtained without and with intravenous contrast. CONTRAST:  24m MULTIHANCE GADOBENATE DIMEGLUMINE 529 MG/ML IV SOLN COMPARISON:  CT of the orbits 06/20/2021. Brain MRI 01/05/2021. FINDINGS: Intermittently motion degraded examination. Most notably, there is mild-to-moderate motion degradation of the axial high-resolution  heavily T2 weighted sequence through the internal auditory canals and moderate motion degradation of the whole brain axial T1 weighted post-contrast sequence. Brain: Mild to moderate generalized parenchymal atrophy. Multifocal T2 FLAIR hyperintense signal abnormality within the cerebral white matter, nonspecific but compatible with mild chronic small vessel ischemic disease. No evidence of an intracranial mass. Specifically, no cerebellopontine angle or internal auditory canal mass is demonstrated. Unremarkable appearance of the 7th and 8th cranial nerves bilaterally. There is no acute infarct. No chronic intracranial  blood products. No extra-axial fluid collection. No midline shift. No pathologic intracranial enhancement identified. Vascular: Maintained flow voids within the proximal large arterial vessels. Skull and upper cervical spine: No focal suspicious marrow lesion. Incompletely assessed cervical spondylosis. Sinuses/Orbits: No mass or acute finding within the imaged orbits. Prior bilateral ocular lens replacement. Small mucous retention cysts within the bilateral maxillary sinuses. Trace mucosal thickening within the bilateral ethmoid sinuses. IMPRESSION: Intermittently motion degraded examination. No evidence of acute intracranial abnormality. No cerebellopontine angle or internal auditory canal mass. Mild chronic small vessel ischemic changes within the cerebral white matter, similar to the prior brain MRI of 01/05/2021. Mild-to-moderate generalized parenchymal atrophy. Mild paranasal sinus disease, as described. Electronically Signed   By: Kellie Simmering D.O.   On: 07/31/2022 08:01    Assessment & Plan:   Problem List Items Addressed This Visit     Diabetes type 2, controlled ([place])    Check A1c On Prandin      Hearing loss    2023 Dr Constance Holster S/p MRI Steroid injection is pending - L ear      Ocular pemphigoid - Primary    Better on the rituximab. Get a "smart speaker"- [employer], [employer] or  [employer] GPT for emails etc?      Relevant Orders   Comprehensive metabolic panel   Hemoglobin A1c   Other Visit Diagnoses     Needs flu shot       Relevant Orders   Flu Vaccine QUAD High Dose(Fluad) (Completed)         No orders of the defined types were placed in this encounter.     Follow-up: Return in about 4 months (around 01/04/2023) for a follow-up visit.  Walker Kehr, MD

## 2022-09-04 NOTE — Assessment & Plan Note (Signed)
Check A1c On Prandin

## 2022-09-04 NOTE — Assessment & Plan Note (Addendum)
Better on the rituximab. Get a "smart speaker"- [employer], [employer] or [employer] GPT for emails etc?

## 2022-09-05 DIAGNOSIS — H9190 Unspecified hearing loss, unspecified ear: Secondary | ICD-10-CM | POA: Diagnosis not present

## 2022-09-07 DIAGNOSIS — I6529 Occlusion and stenosis of unspecified carotid artery: Secondary | ICD-10-CM | POA: Diagnosis not present

## 2022-09-07 DIAGNOSIS — G44011 Episodic cluster headache, intractable: Secondary | ICD-10-CM | POA: Diagnosis not present

## 2022-09-07 DIAGNOSIS — I129 Hypertensive chronic kidney disease with stage 1 through stage 4 chronic kidney disease, or unspecified chronic kidney disease: Secondary | ICD-10-CM | POA: Diagnosis not present

## 2022-09-07 DIAGNOSIS — Z7984 Long term (current) use of oral hypoglycemic drugs: Secondary | ICD-10-CM | POA: Diagnosis not present

## 2022-09-07 DIAGNOSIS — N183 Chronic kidney disease, stage 3 unspecified: Secondary | ICD-10-CM | POA: Diagnosis not present

## 2022-09-07 DIAGNOSIS — H11001 Unspecified pterygium of right eye: Secondary | ICD-10-CM | POA: Diagnosis not present

## 2022-09-07 DIAGNOSIS — H16421 Pannus (corneal), right eye: Secondary | ICD-10-CM | POA: Diagnosis not present

## 2022-09-07 DIAGNOSIS — Z8679 Personal history of other diseases of the circulatory system: Secondary | ICD-10-CM | POA: Diagnosis not present

## 2022-09-07 DIAGNOSIS — H1089 Other conjunctivitis: Secondary | ICD-10-CM | POA: Diagnosis not present

## 2022-09-07 DIAGNOSIS — E1122 Type 2 diabetes mellitus with diabetic chronic kidney disease: Secondary | ICD-10-CM | POA: Diagnosis not present

## 2022-09-07 DIAGNOSIS — Z9889 Other specified postprocedural states: Secondary | ICD-10-CM | POA: Diagnosis not present

## 2022-09-07 DIAGNOSIS — Z83511 Family history of glaucoma: Secondary | ICD-10-CM | POA: Diagnosis not present

## 2022-09-07 DIAGNOSIS — Z79899 Other long term (current) drug therapy: Secondary | ICD-10-CM | POA: Diagnosis not present

## 2022-09-07 DIAGNOSIS — Z96653 Presence of artificial knee joint, bilateral: Secondary | ICD-10-CM | POA: Diagnosis not present

## 2022-09-15 ENCOUNTER — Telehealth: Payer: Self-pay | Admitting: Internal Medicine

## 2022-09-15 DIAGNOSIS — H02051 Trichiasis without entropian right upper eyelid: Secondary | ICD-10-CM | POA: Diagnosis not present

## 2022-09-15 DIAGNOSIS — H5711 Ocular pain, right eye: Secondary | ICD-10-CM | POA: Diagnosis not present

## 2022-09-15 DIAGNOSIS — H18891 Other specified disorders of cornea, right eye: Secondary | ICD-10-CM | POA: Diagnosis not present

## 2022-09-15 DIAGNOSIS — Z947 Corneal transplant status: Secondary | ICD-10-CM | POA: Diagnosis not present

## 2022-09-15 NOTE — Telephone Encounter (Signed)
N/A unable to leave a message for patient to call back to schedule Medicare Annual Wellness Visit   Last AWV  10/08/19  Please schedule at anytime with [facility] if patient calls the office back.      Any questions, please call me at [phone]

## 2022-09-19 DIAGNOSIS — H11233 Symblepharon, bilateral: Secondary | ICD-10-CM | POA: Diagnosis not present

## 2022-09-19 DIAGNOSIS — Z947 Corneal transplant status: Secondary | ICD-10-CM | POA: Diagnosis not present

## 2022-09-19 DIAGNOSIS — H02115 Cicatricial ectropion of left lower eyelid: Secondary | ICD-10-CM | POA: Diagnosis not present

## 2022-09-22 DIAGNOSIS — H5711 Ocular pain, right eye: Secondary | ICD-10-CM | POA: Diagnosis not present

## 2022-10-04 ENCOUNTER — Other Ambulatory Visit: Payer: Self-pay | Admitting: Internal Medicine

## 2022-10-17 DIAGNOSIS — H903 Sensorineural hearing loss, bilateral: Secondary | ICD-10-CM | POA: Diagnosis not present

## 2022-11-03 DIAGNOSIS — H903 Sensorineural hearing loss, bilateral: Secondary | ICD-10-CM | POA: Diagnosis not present

## 2022-11-08 ENCOUNTER — Other Ambulatory Visit: Payer: Self-pay | Admitting: Neurology

## 2022-11-08 ENCOUNTER — Other Ambulatory Visit: Payer: Self-pay | Admitting: *Deleted

## 2022-11-08 DIAGNOSIS — L121 Cicatricial pemphigoid: Secondary | ICD-10-CM | POA: Diagnosis not present

## 2022-11-08 DIAGNOSIS — Z79899 Other long term (current) drug therapy: Secondary | ICD-10-CM | POA: Diagnosis not present

## 2022-11-08 DIAGNOSIS — M316 Other giant cell arteritis: Secondary | ICD-10-CM | POA: Diagnosis not present

## 2022-11-09 LAB — T + B-LYMPHOCYTE DIFFERENTIAL
% CD 3 Pos. Lymph.: 88 % — ABNORMAL HIGH (ref 57.5–86.2)
% CD 4 Pos. Lymph.: 26.6 % — ABNORMAL LOW (ref 30.8–58.5)
Absolute CD 3: 2640 /uL — ABNORMAL HIGH (ref 622–2402)
Absolute CD 4 Helper: 798 /uL (ref 359–1519)
Basophils Absolute: 0 10*3/uL (ref 0.0–0.2)
Basos: 0 %
CD19 % B Cell: 0 % — ABNORMAL LOW (ref 3.3–25.4)
CD19 Abs: 0 /uL — ABNORMAL LOW (ref 12–645)
CD4/CD8 Ratio: 0.43 — ABNORMAL LOW (ref 0.92–3.72)
CD8 % Suppressor T Cell: 62.2 % — ABNORMAL HIGH (ref 12.0–35.5)
CD8 T Cell Abs: 1866 /uL — ABNORMAL HIGH (ref 109–897)
EOS (ABSOLUTE): 0.1 10*3/uL (ref 0.0–0.4)
Eos: 1 %
Hematocrit: 37.3 % — ABNORMAL LOW (ref 37.5–51.0)
Hemoglobin: 13.1 g/dL (ref 13.0–17.7)
Immature Grans (Abs): 0 10*3/uL (ref 0.0–0.1)
Immature Granulocytes: 0 %
Lymphocytes Absolute: 3 10*3/uL (ref 0.7–3.1)
Lymphs: 31 %
MCH: 30.5 pg (ref 26.6–33.0)
MCHC: 35.1 g/dL (ref 31.5–35.7)
MCV: 87 fL (ref 79–97)
Monocytes Absolute: 0.9 10*3/uL (ref 0.1–0.9)
Monocytes: 9 %
Neutrophils Absolute: 5.6 10*3/uL (ref 1.4–7.0)
Neutrophils: 59 %
Platelets: 362 10*3/uL (ref 150–450)
RBC: 4.3 x10E6/uL (ref 4.14–5.80)
RDW: 12.6 % (ref 11.6–15.4)
WBC: 9.6 10*3/uL (ref 3.4–10.8)

## 2022-11-09 LAB — COMPREHENSIVE METABOLIC PANEL
ALT: 16 IU/L (ref 0–44)
AST: 25 IU/L (ref 0–40)
Albumin/Globulin Ratio: 1.6 (ref 1.2–2.2)
Albumin: 4.2 g/dL (ref 3.8–4.8)
Alkaline Phosphatase: 75 IU/L (ref 44–121)
BUN/Creatinine Ratio: 10 (ref 10–24)
BUN: 16 mg/dL (ref 8–27)
Bilirubin Total: 0.3 mg/dL (ref 0.0–1.2)
CO2: 21 mmol/L (ref 20–29)
Calcium: 9.8 mg/dL (ref 8.6–10.2)
Chloride: 100 mmol/L (ref 96–106)
Creatinine, Ser: 1.63 mg/dL — ABNORMAL HIGH (ref 0.76–1.27)
Globulin, Total: 2.6 g/dL (ref 1.5–4.5)
Glucose: 147 mg/dL — ABNORMAL HIGH (ref 70–99)
Potassium: 4.8 mmol/L (ref 3.5–5.2)
Sodium: 146 mmol/L — ABNORMAL HIGH (ref 134–144)
Total Protein: 6.8 g/dL (ref 6.0–8.5)
eGFR: 42 mL/min/{1.73_m2} — ABNORMAL LOW (ref >59)

## 2022-11-22 DIAGNOSIS — L121 Cicatricial pemphigoid: Secondary | ICD-10-CM | POA: Diagnosis not present

## 2022-11-22 DIAGNOSIS — M316 Other giant cell arteritis: Secondary | ICD-10-CM | POA: Diagnosis not present

## 2022-11-24 ENCOUNTER — Other Ambulatory Visit: Payer: Self-pay | Admitting: Internal Medicine

## 2022-11-30 ENCOUNTER — Ambulatory Visit: Payer: Medicare Other | Admitting: Neurology

## 2022-11-30 ENCOUNTER — Encounter: Payer: Self-pay | Admitting: Neurology

## 2022-11-30 VITALS — BP 125/74 | HR 68 | Ht 67.0 in | Wt 133.0 lb

## 2022-11-30 DIAGNOSIS — Z79899 Other long term (current) drug therapy: Secondary | ICD-10-CM | POA: Diagnosis not present

## 2022-11-30 DIAGNOSIS — G44019 Episodic cluster headache, not intractable: Secondary | ICD-10-CM | POA: Diagnosis not present

## 2022-11-30 DIAGNOSIS — L121 Cicatricial pemphigoid: Secondary | ICD-10-CM

## 2022-11-30 MED ORDER — TRAZODONE HCL 50 MG PO TABS: 25.0000 mg | ORAL_TABLET | Freq: Every day | ORAL | 3 refills | Status: DC

## 2022-11-30 NOTE — Progress Notes (Signed)
[facility]    Provider:  Dr Jaynee Eagles Requesting Provider: Alain Marion, Evie Lacks, MD Primary Care Provider:  Cassandria Anger, MD  CC:  right cluster headache  He is significantly improved. No headaches, vision is better. He wakes up every 2 hours. Then it takes him time to sleep. He wakes really. Not taking naps. He goes to bed 930-10am. No snoring. Also difficulty initiating sleep.   He is improved, he had eye surgery on the cornea and he is feeling better with his vision. Light doesn't bother him anymore. They saw Dr. Sharen Counter yesterday. He has not had a headache in 3 months. He is having hearing problems, his left ear is worsening, I am not awareof any ototoxicity with rituximab will ave him discuss with Dr. Sharen Counter at [facility]. His wife is here and provides information. He tried to go down to 2 emgality and had a cluster headache, went back to 3.  Patient complains of symptoms per HPI as well as the following symptoms: left ear decreased hearing . Pertinent negatives and positives per HPI. All others negative   02/09/2022: Tremendous improvement on emgality from daily severe multiple episodes of cluster headaches to 2x a month. Still having the headaches twice a month. It may last for 6 hours or 8 hours. We discussed options, continue taking Emgality, his vision is better slightly, he continues on Rituximab in out office and we monitor his labs and send them to Dr. Sharen Counter ophthalmology at [facility]. We discussed acute management when he does have headaches, discussed options and decided: At onset of headache try Ubrelvy with norco at onset of headache. Can repeat ubrelvy 2 hours later if needed Can also try injectable Zembrace with the norco: Take one injection every 15 minutes if needed max 4 in one day  10/10/2021: He is doing great, just a few headaches, doing well on verapamil and emgality, he had some corneal scarring and he had aprocedure which is helping. Follow up phone visit in  3 months  Interval history: Patient her etoday, he is feeling much better. We have him on verapamil and have given him Emaglity cluster headache dose (today will be the second dose). Lidocaine spray, oxygen, nerve block, sphenopalatine blocks did not last.   Interval history: Patient here for intractable episodic cluster headaches in the setting of very rare ocular pemphigoid.  He has intractable periorbital pain in the right with unilateral lacrimation and rhinorrhea.  Initially thought this might be a secondary headache from the very rare ocular pemphigoid, but at this time it appears he has concomitant cluster headaches; the rarity of ocular pemphigoid in addition with cluster headaches is quite unusual.  Today he has ptosis, likely from botox. Botox x 2 has not helped.  We have tried oral triptans, Nurtec, indomethacin, and none have relieved his symptoms.  Indomethacin was titrated up to 150 mg a day does not appear to be an indomethacin responsive headache but can try going up to 225 mg a day.  We have tried him on Topamax, Zonegran, high-dose steroids for over a month.  We have discontinued his amlodipine and carvedilol and we started verapamil.  Today we also injected him with Emgality.  I performed nerve blocks as well as sphenopalatine ganglion block.  Our plan is to do the following:  Interval history: The patient describes a headache primarily as a constant boring pain.  Certainly, the possibility of hemicrania continua does need to be considered or Cluster headache.  A trial indomethacin(or verapamil  or Emgality) may be of some diagnostic benefit. He did not tolerate topamax but may have helped, neither tolerated zonegran, we have tried botox and will repeat, he declines depakote. Will titrate him off of prenisone and try Indomethacin. If no benefit will try Emgality or Verapamil.   Physially he is better, more energy, still having some redness of the right eye but maybe slightly improved,  vision has not improved, Stopped the cellcept and on the rituximab.   Interval history: The pain is on the right temple, it happens in the afternoon or overnight about the same time every day, right eye waters(both eyes water), other wise headache wakes him up if he does not take medication, his blood pressure has been a little low he was feeling weak and it was 914 systolic. Taking the amlodipine at 6pm, taking the carvedilol 6-7a, 10pm. BP 108/77 and tachycardic. He took gatorade yesterday and he was better but this morning again his BP was a little low. We decided to do nerve blocks today. We discussed topamax, CGRP meds, unfrotunately difficult to assess and treat unclear etiology of headaches - associated with his pemphigoid eye disease or is this an independent headache syndrome with associated autonomic symptoms? If no improvement with rituximab this Thursday we will change course.   Interval history 01/24/2021: I spoke to patient's [place] physician, we are going to try and get infusion here in the office. He also continues on Prednisone. Temple pain/headache is 85% better but he still has pain supraorbitally. Only at night or in the afternoon. But he is very improved. We discussed titrating the steroids and we will stay on 22m for one month then slowly decrease by 176mmonthly. We discussed checking hgba1c and his glucose, I recommended he check his glucose daily, he is feeling well, no problems, his eyes feel much better and are not as red or painful. We received some notes from Dr. PeSharen Counterbut no orders, I have contacted him to get specifics on the pre-treatment, the injection doses and any labwork he wants prior to infusions. He is taking omeprazole daily, taking steroids with food as well, no GI upset.   HPI:  ShJAIRUS TONNEs a [age].o. male here as requested by [name], AlEvie LacksMD for headaches. PMHx ocular pemphigoid, B12 deficiency, HTN, PAC, DM2. New onset right temporal headache,  continuous, sever at times.  Headaches started 2 weeks ago, it wasn't that bad but it is worsening and now can be severe, it is in the temple and radiates to the temple and frontal area. It becomes quite severe 7-8/10. Sharp pain, shooting pain, he had prior headache in the left eye which was not similar it was ocular pain his eye was so inflamed due to the Pemphigoid. This is not the same, this is new, and is not anything he has ever experienced. He has noticed in the last 4-5 days when he eats he has jaw pain. The right eye vision is worsening. No significant history of migraines. No other significant history of headaches, he had one migraine in the remote past and this is nothing like it. The pain is continuous, no known triggers. Alleve is not working. Had a long discussion with patient regarding possiibilites and differential. No other focal neurologic deficits, associated symptoms, inciting events or modifiable factors.  Reviewed notes, labs and imaging from outside physicians, which showed:  Personally reviewed imaging with Dr. PrMamie Nickethi(Neuroradiology) and agree with the following:mild T2 hyperintensities, mild cerebral atrophy, otherwise unremarkable  for age.   11/10/2020: TSH nml, B12 1056, cbc unremarkable, CMP elevated glucose and reduced GFR 52 otherwise normal Review of Systems: Patient complains of symptoms per HPI as well as the following symptoms: headache, vision changes . Pertinent negatives and positives per HPI. All others negative    Social History   Socioeconomic History   Marital status: Married    Spouse name: [name]   Number of children: Not on file   Years of education: Not on file   Highest education level: Not on file  Occupational History   Occupation: MD Retired  Tobacco Use   Smoking status: Former    Types: Cigars   Smokeless tobacco: Never   Tobacco comments:    3-4 cigars per year in the past but none in the past 10 years  Vaping Use   Vaping Use:  Never used  Substance and Sexual Activity   Alcohol use: Not Currently    Alcohol/week: 0.0 - 1.0 standard drinks of alcohol    Comment: maybe 1 mixed drink maybe once a month   Drug use: No   Sexual activity: Not Currently  Other Topics Concern   Not on file  Social History Narrative   Regular Exercise -  YES, golf   Lives at home with wife    Right handed   Caffeine:  1-2 cups/day   Social Determinants of Health   Financial Resource Strain: Low Risk  (10/08/2019)   Overall Financial Resource Strain (CARDIA)    Difficulty of Paying Living Expenses: Not hard at all  Food Insecurity: No Food Insecurity (10/08/2019)   Hunger Vital Sign    Worried About Running Out of Food in the Last Year: Never true    [place] in the Last Year: Never true  Transportation Needs: No Transportation Needs (10/08/2019)   PRAPARE - Hydrologist (Medical): No    Lack of Transportation (Non-Medical): No  Physical Activity: Insufficiently Active (10/08/2019)   Exercise Vital Sign    Days of Exercise per Week: 3 days    Minutes of Exercise per Session: 30 min  Stress: No Stress Concern Present (10/08/2019)   [place]    Feeling of Stress : Not at all  Social Connections: [place] (10/08/2019)   Social Connection and Isolation Panel [NHANES]    Frequency of Communication with Friends and Family: More than three times a week    Frequency of Social Gatherings with Friends and Family: More than three times a week    Attends Religious Services: 1 to 4 times per year    Active Member of [employer] or Organizations: Yes    Attends Archivist Meetings: 1 to 4 times per year    Marital Status: Married  Human resources officer Violence: Not At Risk (10/08/2019)   Humiliation, Afraid, Rape, and Kick questionnaire    Fear of Current or Ex-Partner: No    Emotionally Abused: No    Physically Abused:  No    Sexually Abused: No    Family History  Problem Relation Age of Onset   Arthritis Mother    Hypertension Father    Coronary artery disease Father    Hypertension Brother    Hypertension Other    Hypertension Brother    Coronary artery disease Other     Past Medical History:  Diagnosis Date   BPH (benign prostatic hypertrophy)    Hyperlipidemia    Hypertension  LBP (low back pain)    MVP (mitral valve prolapse)    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   on cellcept   Osteoarthritis    PAC (premature atrial contraction)    Statin intolerance    Type II or unspecified type diabetes mellitus without mention of complication, not stated as uncontrolled 2011   Vitamin B 12 deficiency 2011   Vitamin D deficiency    Vitiligo     Patient Active Problem List   Diagnosis Date Noted   Long-term use of high-risk medication 12/03/2022   Hearing loss 05/31/2022   Malnutrition ([place]) 06/23/2021   Episodic cluster headache, not intractable 06/09/2021   Ocular pemphigoid 05/19/2021   Chronic sinusitis 05/12/2021   Depression 05/12/2021   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   Right sided temporal headache 01/06/2021   Chronic renal insufficiency, stage 3 (moderate) ([place]) 05/11/2020   Pemphigoid 05/10/2020   Elevated serum creatinine 07/11/2018   Hip pain, acute, right 01/10/2018   Postoperative anemia due to acute blood loss 09/30/2014   Status post left partial knee replacement 09/15/2014   S/P left UKR 09/14/2014   S/P right TKA 06/15/2014   Osteoarthritis, knee 11/24/2013   Weight loss 06/24/2013   PAC (premature atrial contraction) 01/13/2013   Heart murmur 01/13/2013   Well adult exam 11/03/2012   Paresthesia 10/22/2012   Hypertension 04/18/2012   Diabetes type 2, controlled ([place]) 10/20/2010   B12 deficiency 10/20/2010   Vitamin D deficiency 10/20/2010   Dyslipidemia 07/14/2010   BENIGN PROSTATIC HYPERTROPHY 07/14/2010   OSTEOARTHRITIS 07/14/2010    LOW BACK PAIN 07/14/2010   HYPERGLYCEMIA 07/14/2010    Past Surgical History:  Procedure Laterality Date   arm surgery Left    ORIF left  forearm   CATARACT EXTRACTION  2012   EYE SURGERY     eye surgery Right 05/02/2022   remove scar on cornea and then put amniotic cells on it   eyelid surgery Right    HERNIA REPAIR Bilateral 1971   PARTIAL KNEE ARTHROPLASTY Left 09/14/2014   Procedure: LEFT UNICOMPARTMENTAL KNEE ARTHROPLASTY MEDIALLY;  Surgeon: Mauri Pole, MD;  Location: [facility];  Service: Orthopedics;  Laterality: Left;   TOTAL KNEE ARTHROPLASTY Right 06/15/2014   Procedure: RIGHT TOTAL KNEE ARTHROPLASTY;  Surgeon: Mauri Pole, MD;  Location: [facility];  Service: Orthopedics;  Laterality: Right;    Current Outpatient Medications  Medication Sig Dispense Refill   Cholecalciferol (VITAMIN D) 2000 UNITS CAPS Take 1 capsule by mouth every other day.     cyanocobalamin (,VITAMIN B-12,) 1000 MCG/ML injection INJECT 1 ML INTO THE MUSCLE EVERY 14 DAYS 30 mL 3   escitalopram (LEXAPRO) 5 MG tablet Take 1 tablet (5 mg total) by mouth at bedtime. 90 tablet 1   fenofibrate (TRICOR) 145 MG tablet Take 145 mg by mouth daily.     Galcanezumab-gnlm (EMGALITY, 300 MG DOSE,) 100 MG/ML SOSY Inject 300 mg (3 pens) into the skin every 30 days during cluster headache cycle and stop when cycle is over. 3 mL 11   Melatonin 10 MG CAPS Take 10 capsules by mouth at bedtime.     metFORMIN (GLUCOPHAGE) 500 MG tablet TAKE ONE TABLET BY MOUTH THREE TIMES DAILY 270 tablet 1   riTUXimab-abbs (TRUXIMA) 100 MG/10ML injection Inject into the vein. Injection 3 times a year     tamsulosin (FLOMAX) 0.4 MG CAPS capsule TAKE ONE CAPSULE BY MOUTH DAILY 90 capsule 2   traZODone (DESYREL) 50 MG tablet Take 0.5-1 tablets (25-50  mg total) by mouth at bedtime. 90 tablet 3   verapamil (CALAN-SR) 240 MG CR tablet Take 1 tablet (240 mg total) by mouth at bedtime. Please stop amlodipine. 90 tablet 2   HYDROcodone-acetaminophen  (NORCO) 7.5-325 MG tablet Take 1-2 tablets by mouth every 6 (six) hours as needed for moderate pain. (Patient not taking: Reported on 11/30/2022) 60 tablet 0   Ubrogepant (UBRELVY) 100 MG TABS Take 100 mg by mouth every 2 (two) hours as needed. Maximum 227m a day. (Patient not taking: Reported on 11/30/2022) 4 tablet 0   No current facility-administered medications for this visit.    Allergies as of 11/30/2022 - Review Complete 11/30/2022  Allergen Reaction Noted   Statins  06/23/2021   Atorvastatin  10/20/2010   Rosuvastatin  10/20/2010   Zonisamide  05/12/2021    Vitals: BP 125/74   Pulse 68   Ht _0  (1.702 m)   Wt 133 lb (60.3 kg)   BMI 20.83 kg/m  Last Weight:  Wt Readings from Last 1 Encounters:  11/30/22 133 lb (60.3 kg)   Last Height:   Ht Readings from Last 1 Encounters:  11/30/22 _1  (1.702 m)    Exam: NAD, pleasant                  Speech:    Speech is normal; fluent and spontaneous with normal comprehension.  Cognition:    The patient is oriented to person, place, and time;     recent and remote memory intact;     language fluent;    Cranial Nerves:    The pupils are equal, round, and reactive to light.Trigeminal sensation is intact and the muscles of mastication are normal. The face is symmetric. The palate elevates in the midline. Hearing intact. Voice is normal. Shoulder shrug is normal. The tongue has normal motion without fasciculations.   Coordination:  No dysmetria  Motor Observation:    No asymmetry, no atrophy, and no involuntary movements noted. Tone:    Normal muscle tone.     Strength:    Strength is V/V in the upper and lower limbs.      Sensation: intact to LT    Assessment/Plan:   81y.o. male here as requested by [name], AEvie Lacks MD for headaches. PMHx ocular pemphigoid, B12 deficiency, HTN, PAC, DM2. New onset right temporal headache in the setting of pemphigoid Autoimmune eye disease), continuous, severe at times, no  signifcant improvement with steroids. Unfortunately difficult to assess and treat unclear etiology of headaches - associated with his pemphigoid eye disease or is this an independent headache syndrome with associated autonomic symptoms? MRI of the of the brain and carotid ultrasound were completed without etiology.   Doing great on Emgality continue for headaches will try taper off or down Continue: Rituximab and [facility]. For ocular pemphigoid Blood test today Start Trazodone at bedtime for insomnia as needed Emgality: Decrease to 2 injections 1/5 Decrease to 1 injection 2/5 Continue one injection: 3/13, 4/20. 5/27   PRIOR ASSESSMENT AND PLANS: Better with verapamil and Emgality. Now on Verapamil 242ma day (cannot tolerate higher) for cluster headache dosage. Appears to be responding to cluster headache treatment excellent. Tremendous improvement on emgality from daily severe multiple episodes of cluster headaches to 2x a month. Still having the headaches twice a month. It may last for 6 hours or 8 hours.  We discussed acute management when he does have headaches, discussed options and decided: At  onset of headache try Ubrelvy with norco at onset of headache. Can repeat ubrelvy 2 hours later if needed, Can also try injectable Zembrace with the norco: Take one injection every 15 minutes if needed max 4 in one day gave samples  Nerve blocks, sphenopalatine blocks,Indomethacin, lidocaine nasal spray did not help. He had side effects to imitrex. He did not tolerate topamax but may have helped, neither tolerated zonegran. Botox did not help, nurtec did not help, he declined depakote, , high dose prednisone did not help significantly. has resolved ptosis from last botox injections. oxygen 15 L high flow for acute events did not help, will cancel order  MRI of the brain was negative as were carotid dopplers.  Ordered CT of the orbits which showed swelling underneath the  right eyelid suggestive of orbital cellulitis,finished Keflex 572m bid start 7 days still swollen  Continue: Rituximab and [facility] to [facility]. For ocular pemphigoid   PRIOR Assessment and plan:   I spoke to patient's DManila we are going to try and get infusion here in the office. He also continues on Prednisone 664m Temple pain/headache is 85% better but he still has pain supraorbitally however only at night or in the afternoon. But he is very improved. We discussed titrating the steroids and we will stay on 6051mor one month then slowly decrease by 5m52mnthly. We discussed checking hgba1c and his glucose, I recommended he check his glucose daily, he is feeling well, no problems, his eyes feel much better and are not as red or painful. We received some notes from Dr. PereSharen Countert no orders, I have contacted him to get specifics on Rituximab including the pre-treatment, the injection doses and any labwork he wants prior to infusions. He is taking omeprazole daily, taking steroids with food as well, no GI upset.  Unclear etiology of headache but it has to be inflammatory/vascular or somehow related to the ocular pemphigoid, I doubt he would develop migraines or cluster headache at the age of 79 b34 trigeminal autonomic cephalalgias in the differential. Still we spoke about trying Depakote or other migraine medication(I do not think this is a migraine but it still may benefit from these medications). We decided to stay with the prednisone as it is helping after only 2 weeks and can consider other medications if needed (he is not enthusiastic about topamax or gabapentin, nurtec helped a little, wonder if qulipta would be helpful or depakote)  PRIOR Patient is on cellcept so I would think Temporal Arteritis(GCA) less likely however he has a new right temporal headache (he has no significant headache history) over the last 2 weeks with jaw symptoms and worsening vision. I am  going to treat him for GCA and order esr/crp however not sure they will be reliable. Will order Ultra Sound of the temporal arteries, if esr/crp elevated we can discuss a biopsy but may be elevated due to his current inflammatory eye disease. I have contacted his ocular immunologist Dr. PereSharen Counter(Addendum: ESR 31(ULN 30), CRP 13(ULN 10), temporal artery ultrasound without evidence of halo sign)  VictSeveriano Gilbert ocular immunologist [phone]    Cc: [name], AlekEvie Lacks,  VictSeveriano Gilbert ocular immunologist [phone] Ill  [facility] [id number] [place] [id number] over 30 minutes of face-to-face and non-face-to-face time with patient on the  1. Episodic cluster headache, not intractable   2. Long-term  use of high-risk medication   3. Ocular pemphigoid    diagnosis.  This included previsit chart review, lab review, study review, order entry, electronic health record documentation, patient education on the different diagnostic and therapeutic options, counseling and coordination of care, risks and benefits of management, compliance, or risk factor reduction  Phone [phone] Fax [phone]  I spent 25 minutes of face-to-face and non-face-to-face time with patient on the  1. Episodic cluster headache, not intractable   2. Long-term use of high-risk medication   3. Ocular pemphigoid     diagnosis.  This included previsit chart review, lab review, study review, order entry, electronic health record documentation, patient education on the different diagnostic and therapeutic options, counseling and coordination of care, risks and benefits of management, compliance, or risk factor reduction

## 2022-11-30 NOTE — Patient Instructions (Addendum)
Blood test Start Trazodone at bedtime Emgality: Decrease to 2 injections 1/5 Decrease to 1 injection 2/5 Continue one injection: 3/13, 4/20. 5/27  Trazodone Trazodone Tablets What is this medication? TRAZODONE (TRAZ oh done) treats depression. It increases the amount of serotonin in the brain, a hormone that helps regulate mood. This medicine may be used for other purposes; ask your health care provider or pharmacist if you have questions. COMMON BRAND NAME(S): Desyrel What should I tell my care team before I take this medication? They need to know if you have any of these conditions: Attempted suicide or thinking about it Bipolar disorder Bleeding problems Glaucoma Heart disease, or previous heart attack Irregular heart beat Kidney or liver disease Low levels of sodium in the blood An unusual or allergic reaction to trazodone, other medications, foods, dyes or preservatives Pregnant or trying to get pregnant Breast-feeding How should I use this medication? Take this medication by mouth with a glass of water. Follow the directions on the prescription label. Take this medication shortly after a meal or a light snack. Take your medication at regular intervals. Do not take your medication more often than directed. Do not stop taking this medication suddenly except upon the advice of your care team. Stopping this medication too quickly may cause serious side effects or your condition may worsen. A special MedGuide will be given to you by the pharmacist with each prescription and refill. Be sure to read this information carefully each time. Talk to your care team regarding the use of this medication in children. Special care may be needed. Overdosage: If you think you have taken too much of this medicine contact a poison control center or emergency room at once. NOTE: This medicine is only for you. Do not share this medicine with others. What if I miss a dose? If you miss a dose, take it  as soon as you can. If it is almost time for your next dose, take only that dose. Do not take double or extra doses. What may interact with this medication? Do not take this medication with any of the following: Certain medications for fungal infections like fluconazole, itraconazole, ketoconazole, posaconazole, voriconazole Cisapride Dronedarone Linezolid MAOIs like Carbex, Eldepryl, Marplan, Nardil, and Parnate Mesoridazine Methylene blue (injected into a vein) Pimozide Saquinavir Thioridazine This medication may also interact with the following: Alcohol Antiviral medications for HIV or AIDS Aspirin and aspirin-like medications Barbiturates like phenobarbital Certain medications for blood pressure, heart disease, irregular heart beat Certain medications for depression, anxiety, or psychotic disturbances Certain medications for migraine headache like almotriptan, eletriptan, frovatriptan, naratriptan, rizatriptan, sumatriptan, zolmitriptan Certain medications for seizures like carbamazepine and phenytoin Certain medications for sleep Certain medications that treat or prevent blood clots like dalteparin, enoxaparin, warfarin Digoxin Fentanyl Lithium NSAIDS, medications for pain and inflammation, like ibuprofen or naproxen Other medications that prolong the QT interval (cause an abnormal heart rhythm) like dofetilide Rasagiline Supplements like St. John's wort, kava kava, valerian Tramadol Tryptophan This list may not describe all possible interactions. Give your health care provider a list of all the medicines, herbs, non-prescription drugs, or dietary supplements you use. Also tell them if you smoke, drink alcohol, or use illegal drugs. Some items may interact with your medicine. What should I watch for while using this medication? Tell your care team if your symptoms do not get better or if they get worse. Visit your care team for regular checks on your progress. Because it may  take several weeks to see the  full effects of this medication, it is important to continue your treatment as prescribed by your care team. Watch for new or worsening thoughts of suicide or depression. This includes sudden changes in mood, behaviors, or thoughts. These changes can happen at any time but are more common in the beginning of treatment or after a change in dose. Call your care team right away if you experience these thoughts or worsening depression. Manic episodes may happen in patients with bipolar disorder who take this medication. Watch for changes in feelings or behaviors such as feeling anxious, nervous, agitated, panicky, irritable, hostile, aggressive, impulsive, severely restless, overly excited and hyperactive, or trouble sleeping. These changes can happen at any time but are more common in the beginning of treatment or after a change in dose. Call your care team right away if you notice any of these symptoms. You may get drowsy or dizzy. Do not drive, use machinery, or do anything that needs mental alertness until you know how this medication affects you. Do not stand or sit up quickly, especially if you are an older patient. This reduces the risk of dizzy or fainting spells. Alcohol may interfere with the effect of this medication. Avoid alcoholic drinks. This medication may cause dry eyes and blurred vision. If you wear contact lenses you may feel some discomfort. Lubricating drops may help. See your eye doctor if the problem does not go away or is severe. Your mouth may get dry. Chewing sugarless gum, sucking hard candy and drinking plenty of water may help. Contact your care team if the problem does not go away or is severe. What side effects may I notice from receiving this medication? Side effects that you should report to your care team as soon as possible: Allergic reactions--skin rash, itching, hives, swelling of the face, lips, tongue, or throat Bleeding--bloody or black,  tar-like stools, red or dark brown urine, vomiting blood or brown material that looks like coffee grounds, small, red or purple spots on skin, unusual bleeding or bruising Heart rhythm changes--fast or irregular heartbeat, dizziness, feeling faint or lightheaded, chest pain, trouble breathing Low blood pressure--dizziness, feeling faint or lightheaded, blurry vision Low sodium level--muscle weakness, fatigue, dizziness, headache, confusion Prolonged or painful erection Serotonin syndrome--irritability, confusion, fast or irregular heartbeat, muscle stiffness, twitching muscles, sweating, high fever, seizures, chills, vomiting, diarrhea Sudden eye pain or change in vision such as blurry vision, seeing halos around lights, vision loss Thoughts of suicide or self-harm, worsening mood, feelings of depression Side effects that usually do not require medical attention (report to your care team if they continue or are bothersome): Change in sex drive or performance Constipation Dizziness Drowsiness Dry mouth This list may not describe all possible side effects. Call your doctor for medical advice about side effects. You may report side effects to FDA at 1-800-FDA-1088. Where should I keep my medication? Keep out of the reach of children and pets. Store at room temperature between 15 and 30 degrees C (59 to 86 degrees F). Protect from light. Keep container tightly closed. Throw away any unused medication after the expiration date. NOTE: This sheet is a summary. It may not cover all possible information. If you have questions about this medicine, talk to your doctor, pharmacist, or health care provider.  2023 Elsevier/Gold Standard (2020-12-01 00:00:00)

## 2022-12-01 LAB — IGG, IGA, IGM
IgA/Immunoglobulin A, Serum: 206 mg/dL (ref 61–437)
IgG (Immunoglobin G), Serum: 725 mg/dL (ref 603–1613)
IgM (Immunoglobulin M), Srm: 21 mg/dL (ref 15–143)

## 2022-12-03 DIAGNOSIS — Z79899 Other long term (current) drug therapy: Secondary | ICD-10-CM | POA: Insufficient documentation

## 2022-12-27 ENCOUNTER — Telehealth: Payer: Self-pay | Admitting: *Deleted

## 2022-12-27 DIAGNOSIS — H11233 Symblepharon, bilateral: Secondary | ICD-10-CM | POA: Diagnosis not present

## 2022-12-27 DIAGNOSIS — Z79899 Other long term (current) drug therapy: Secondary | ICD-10-CM | POA: Diagnosis not present

## 2022-12-27 DIAGNOSIS — H1089 Other conjunctivitis: Secondary | ICD-10-CM | POA: Diagnosis not present

## 2022-12-27 NOTE — Telephone Encounter (Signed)
A user error has taken place: encounter opened in error, closed for administrative reasons.

## 2023-01-04 ENCOUNTER — Encounter: Payer: Self-pay | Admitting: Internal Medicine

## 2023-01-04 ENCOUNTER — Ambulatory Visit (INDEPENDENT_AMBULATORY_CARE_PROVIDER_SITE_OTHER): Payer: Medicare Other | Admitting: Internal Medicine

## 2023-01-04 VITALS — BP 122/74 | HR 54 | Temp 98.7°F | Ht 67.0 in | Wt 132.0 lb

## 2023-01-04 DIAGNOSIS — D509 Iron deficiency anemia, unspecified: Secondary | ICD-10-CM | POA: Insufficient documentation

## 2023-01-04 DIAGNOSIS — L121 Cicatricial pemphigoid: Secondary | ICD-10-CM | POA: Diagnosis not present

## 2023-01-04 DIAGNOSIS — G44099 Other trigeminal autonomic cephalgias (TAC), not intractable: Secondary | ICD-10-CM

## 2023-01-04 DIAGNOSIS — E119 Type 2 diabetes mellitus without complications: Secondary | ICD-10-CM

## 2023-01-04 DIAGNOSIS — K219 Gastro-esophageal reflux disease without esophagitis: Secondary | ICD-10-CM | POA: Insufficient documentation

## 2023-01-04 DIAGNOSIS — E538 Deficiency of other specified B group vitamins: Secondary | ICD-10-CM

## 2023-01-04 DIAGNOSIS — Z860100 Personal history of colon polyps, unspecified: Secondary | ICD-10-CM | POA: Insufficient documentation

## 2023-01-04 DIAGNOSIS — R630 Anorexia: Secondary | ICD-10-CM

## 2023-01-04 DIAGNOSIS — E559 Vitamin D deficiency, unspecified: Secondary | ICD-10-CM

## 2023-01-04 DIAGNOSIS — F329 Major depressive disorder, single episode, unspecified: Secondary | ICD-10-CM

## 2023-01-04 DIAGNOSIS — E785 Hyperlipidemia, unspecified: Secondary | ICD-10-CM | POA: Diagnosis not present

## 2023-01-04 DIAGNOSIS — N320 Bladder-neck obstruction: Secondary | ICD-10-CM

## 2023-01-04 DIAGNOSIS — K5730 Diverticulosis of large intestine without perforation or abscess without bleeding: Secondary | ICD-10-CM | POA: Insufficient documentation

## 2023-01-04 LAB — COMPREHENSIVE METABOLIC PANEL WITH GFR
ALT: 18 U/L (ref 0–53)
AST: 26 U/L (ref 0–37)
Albumin: 4.4 g/dL (ref 3.5–5.2)
Alkaline Phosphatase: 72 U/L (ref 39–117)
BUN: 22 mg/dL (ref 6–23)
CO2: 28 meq/L (ref 19–32)
Calcium: 9.9 mg/dL (ref 8.4–10.5)
Chloride: 103 meq/L (ref 96–112)
Creatinine, Ser: 1.65 mg/dL — ABNORMAL HIGH (ref 0.40–1.50)
GFR: 38.91 mL/min — ABNORMAL LOW (ref >60.00)
Glucose, Bld: 92 mg/dL (ref 70–99)
Potassium: 4.6 meq/L (ref 3.5–5.1)
Sodium: 140 meq/L (ref 135–145)
Total Bilirubin: 0.5 mg/dL (ref 0.2–1.2)
Total Protein: 6.7 g/dL (ref 6.0–8.3)

## 2023-01-04 LAB — HEMOGLOBIN A1C: Hgb A1c MFr Bld: 6.1 % (ref 4.6–6.5)

## 2023-01-04 LAB — PSA: PSA: 0.78 ng/mL (ref 0.10–4.00)

## 2023-01-04 LAB — VITAMIN B12: Vitamin B-12: 885 pg/mL (ref 211–911)

## 2023-01-04 LAB — VITAMIN D 25 HYDROXY (VIT D DEFICIENCY, FRACTURES): VITD: 51.69 ng/mL (ref 30.00–100.00)

## 2023-01-04 NOTE — Progress Notes (Signed)
Subjective:  Patient ID: Louis Kemp, male    DOB: 1941/07/07  Age: 82 y.o. MRN: [id number]  CC: Follow-up (Would like to know if he needs RSV shot)   HPI Louis Kemp presents for depression, dyslipidemia, HTN f/u Now on Losartan eye drops for scar tissue  Outpatient Medications Prior to Visit  Medication Sig Dispense Refill   Cholecalciferol (VITAMIN D) 2000 UNITS CAPS Take 1 capsule by mouth every other day.     cyanocobalamin (,VITAMIN B-12,) 1000 MCG/ML injection INJECT 1 ML INTO THE MUSCLE EVERY 14 DAYS 30 mL 3   escitalopram (LEXAPRO) 5 MG tablet Take 1 tablet (5 mg total) by mouth at bedtime. 90 tablet 1   fenofibrate (TRICOR) 145 MG tablet Take 145 mg by mouth daily.     Galcanezumab-gnlm (EMGALITY, 300 MG DOSE,) 100 MG/ML SOSY Inject 300 mg (3 pens) into the skin every 30 days during cluster headache cycle and stop when cycle is over. 3 mL 11   HYDROcodone-acetaminophen (NORCO) 7.5-325 MG tablet Take 1-2 tablets by mouth every 6 (six) hours as needed for moderate pain. 60 tablet 0   Melatonin 10 MG CAPS Take 10 capsules by mouth at bedtime.     metFORMIN (GLUCOPHAGE) 500 MG tablet TAKE ONE TABLET BY MOUTH THREE TIMES DAILY 270 tablet 1   PROLENSA 0.07 % SOLN Apply to eye.     riTUXimab-abbs (TRUXIMA) 100 MG/10ML injection Inject into the vein. Injection 3 times a year     tamsulosin (FLOMAX) 0.4 MG CAPS capsule TAKE ONE CAPSULE BY MOUTH DAILY 90 capsule 2   traZODone (DESYREL) 50 MG tablet Take 0.5-1 tablets (25-50 mg total) by mouth at bedtime. 90 tablet 3   verapamil (CALAN-SR) 240 MG CR tablet Take 1 tablet (240 mg total) by mouth at bedtime. Please stop amlodipine. 90 tablet 2   Ubrogepant (UBRELVY) 100 MG TABS Take 100 mg by mouth every 2 (two) hours as needed. Maximum '200mg'$  a day. (Patient not taking: Reported on 11/30/2022) 4 tablet 0   No facility-administered medications prior to visit.    ROS: Review of Systems  Constitutional:  Negative for appetite change,  fatigue and unexpected weight change.  HENT:  Negative for congestion, nosebleeds, sneezing, sore throat and trouble swallowing.   Eyes:  Positive for photophobia and visual disturbance. Negative for pain, redness and itching.  Respiratory:  Negative for cough.   Cardiovascular:  Negative for chest pain, palpitations and leg swelling.  Gastrointestinal:  Negative for abdominal distention, blood in stool, diarrhea and nausea.  Genitourinary:  Negative for frequency and hematuria.  Musculoskeletal:  Negative for back pain, gait problem, joint swelling and neck pain.  Skin:  Negative for rash.  Neurological:  Negative for dizziness, tremors, speech difficulty and weakness.  Psychiatric/Behavioral:  Positive for sleep disturbance. Negative for agitation, dysphoric mood, self-injury and suicidal ideas. The patient is nervous/anxious.     Objective:  BP 122/74 (BP Location: Right Arm, Patient Position: Sitting, Cuff Size: Normal)   Pulse (!) 54   Temp 98.7 F (37.1 C) (Oral)   Ht '5\' 7"'$  (1.702 m)   Wt 132 lb (59.9 kg)   SpO2 98%   BMI 20.67 kg/m   BP Readings from Last 3 Encounters:  01/04/23 122/74  11/30/22 125/74  09/04/22 128/62    Wt Readings from Last 3 Encounters:  01/04/23 132 lb (59.9 kg)  11/30/22 133 lb (60.3 kg)  09/04/22 135 lb 12.8 oz (61.6 kg)    Physical Exam  Constitutional:      General: He is not in acute distress.    Appearance: He is well-developed.     Comments: NAD  Eyes:     Conjunctiva/sclera: Conjunctivae normal.     Pupils: Pupils are equal, round, and reactive to light.  Neck:     Thyroid: No thyromegaly.     Vascular: No JVD.  Cardiovascular:     Rate and Rhythm: Normal rate and regular rhythm.     Heart sounds: Normal heart sounds. No murmur heard.    No friction rub. No gallop.  Pulmonary:     Effort: Pulmonary effort is normal. No respiratory distress.     Breath sounds: Normal breath sounds. No wheezing or rales.  Chest:     Chest wall:  No tenderness.  Abdominal:     General: Bowel sounds are normal. There is no distension.     Palpations: Abdomen is soft. There is no mass.     Tenderness: There is no abdominal tenderness. There is no guarding or rebound.  Musculoskeletal:        General: No tenderness. Normal range of motion.     Cervical back: Normal range of motion.  Lymphadenopathy:     Cervical: No cervical adenopathy.  Skin:    General: Skin is warm and dry.     Findings: No rash.  Neurological:     Mental Status: He is alert and oriented to person, place, and time.     Cranial Nerves: No cranial nerve deficit.     Motor: No abnormal muscle tone.     Coordination: Coordination normal.     Gait: Gait normal.     Deep Tendon Reflexes: Reflexes are normal and symmetric.  Psychiatric:        Behavior: Behavior normal.        Thought Content: Thought content normal.        Judgment: Judgment normal.   Looks better  Lab Results  Component Value Date   WBC 9.6 11/08/2022   HGB 13.1 11/08/2022   HCT 37.3 (L) 11/08/2022   PLT 362 11/08/2022   GLUCOSE 147 (H) 11/08/2022   CHOL 161 05/31/2022   TRIG 108.0 05/31/2022   HDL 54.80 05/31/2022   LDLCALC 85 05/31/2022   ALT 16 11/08/2022   AST 25 11/08/2022   NA 146 (H) 11/08/2022   K 4.8 11/08/2022   CL 100 11/08/2022   CREATININE 1.63 (H) 11/08/2022   BUN 16 11/08/2022   CO2 21 11/08/2022   TSH 1.10 05/31/2022   PSA 0.63 11/10/2020   INR 1.01 09/03/2014   HGBA1C 6.1 09/04/2022   MICROALBUR 1.8 11/10/2020    MR BRAIN/IAC W WO CONTRAST  Result Date: 07/31/2022 CLINICAL DATA:  Provided history: Hearing loss. Additional history provided by scanning technologist: Patient reports left greater than right hearing loss for 6 months. Blurred vision. EXAM: MRI HEAD WITHOUT AND WITH CONTRAST TECHNIQUE: Multiplanar, multiecho pulse sequences of the brain and surrounding structures were obtained without and with intravenous contrast. CONTRAST:  15m MULTIHANCE  GADOBENATE DIMEGLUMINE 529 MG/ML IV SOLN COMPARISON:  CT of the orbits 06/20/2021. Brain MRI 01/05/2021. FINDINGS: Intermittently motion degraded examination. Most notably, there is mild-to-moderate motion degradation of the axial high-resolution heavily T2 weighted sequence through the internal auditory canals and moderate motion degradation of the whole brain axial T1 weighted post-contrast sequence. Brain: Mild to moderate generalized parenchymal atrophy. Multifocal T2 FLAIR hyperintense signal abnormality within the cerebral white matter, nonspecific but compatible with mild  chronic small vessel ischemic disease. No evidence of an intracranial mass. Specifically, no cerebellopontine angle or internal auditory canal mass is demonstrated. Unremarkable appearance of the 7th and 8th cranial nerves bilaterally. There is no acute infarct. No chronic intracranial blood products. No extra-axial fluid collection. No midline shift. No pathologic intracranial enhancement identified. Vascular: Maintained flow voids within the proximal large arterial vessels. Skull and upper cervical spine: No focal suspicious marrow lesion. Incompletely assessed cervical spondylosis. Sinuses/Orbits: No mass or acute finding within the imaged orbits. Prior bilateral ocular lens replacement. Small mucous retention cysts within the bilateral maxillary sinuses. Trace mucosal thickening within the bilateral ethmoid sinuses. IMPRESSION: Intermittently motion degraded examination. No evidence of acute intracranial abnormality. No cerebellopontine angle or internal auditory canal mass. Mild chronic small vessel ischemic changes within the cerebral white matter, similar to the prior brain MRI of 01/05/2021. Mild-to-moderate generalized parenchymal atrophy. Mild paranasal sinus disease, as described. Electronically Signed   By: Kellie Simmering D.O.   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     Follow-up: Return in about 3 months (around 04/05/2023) for a follow-up visit.  Walker Kehr, MD

## 2023-01-04 NOTE — Assessment & Plan Note (Signed)
Continue low-dose Lexapro at night

## 2023-01-04 NOTE — Assessment & Plan Note (Signed)
Much better 

## 2023-01-04 NOTE — Assessment & Plan Note (Addendum)
Doing well  Wt Readings from Last 3 Encounters:  01/04/23 132 lb (59.9 kg)  11/30/22 133 lb (60.3 kg)  09/04/22 135 lb 12.8 oz (61.6 kg)

## 2023-01-04 NOTE — Assessment & Plan Note (Signed)
On Tricor

## 2023-01-04 NOTE — Assessment & Plan Note (Signed)
Check A1c. 

## 2023-01-04 NOTE — Assessment & Plan Note (Signed)
Chronic Cont w/B12 injections

## 2023-01-04 NOTE — Assessment & Plan Note (Signed)
Now on Losartan eye drops

## 2023-01-09 ENCOUNTER — Other Ambulatory Visit: Payer: Self-pay | Admitting: Internal Medicine

## 2023-02-15 ENCOUNTER — Ambulatory Visit (INDEPENDENT_AMBULATORY_CARE_PROVIDER_SITE_OTHER): Payer: Medicare Other

## 2023-02-15 VITALS — Ht 67.0 in | Wt 132.0 lb

## 2023-02-15 DIAGNOSIS — Z0000 Encounter for general adult medical examination without abnormal findings: Secondary | ICD-10-CM

## 2023-02-15 NOTE — Progress Notes (Addendum)
I connected with  Richrd Sox on 02/15/2023 at 4:00 p.m. EST by telephone and verified that I am speaking with the correct person using two identifiers.  Location: Patient: Home Provider: [place] Persons participating in the virtual visit: [place]   I discussed the limitations, risks, security and privacy concerns of performing an evaluation and management service by telephone and the availability of in person appointments. The patient expressed understanding and agreed to proceed.  Interactive audio and video telecommunications were attempted between this nurse and patient, however failed, due to patient having technical difficulties OR patient did not have access to video capability.  We continued and completed visit with audio only.  Some vital signs may be absent or patient reported.   Sheral Flow, LPN  Subjective:   Louis Kemp is a 82 y.o. male who presents for Medicare Annual/Subsequent preventive examination.  Review of Systems     Cardiac Risk Factors include: advanced age (>30mn, >>36women);family history of premature cardiovascular disease;hypertension;male gender;diabetes mellitus     Objective:    Today's Vitals   02/15/23 1602  Weight: 132 lb (59.9 kg)  Height: '5\' 7"'$  (1.702 m)  PainSc: 0-No pain   Body mass index is 20.67 kg/m.     02/15/2023    4:20 PM 10/08/2019    1:09 PM 09/14/2014    3:00 PM 09/03/2014   10:43 AM 06/15/2014    1:37 PM 06/15/2014    9:07 AM 06/10/2014   12:33 PM  Advanced Directives  Does Patient Have a Medical Advance Directive? Yes Yes Yes Yes  Patient has advance directive, copy in chart Patient has advance directive, copy in chart  Type of Advance Directive [place] will [place]Living will [place]ville[place] will Living will Living will Living will  Copy of [place]  Chart? No - copy requested No - copy requested  Yes     Pre-existing out of facility DNR order (yellow form or pink MOST form)     No      Current Medications (verified) Outpatient Encounter Medications as of 02/15/2023  Medication Sig   Cholecalciferol (VITAMIN D) 2000 UNITS CAPS Take 1 capsule by mouth every other day.   cyanocobalamin (,VITAMIN B-12,) 1000 MCG/ML injection INJECT 1 ML INTO THE MUSCLE EVERY 14 DAYS   escitalopram (LEXAPRO) 5 MG tablet Take 1 tablet (5 mg total) by mouth at bedtime.   fenofibrate (TRICOR) 145 MG tablet Take 145 mg by mouth daily.   Galcanezumab-gnlm (EMGALITY, 300 MG DOSE,) 100 MG/ML SOSY Inject 300 mg (3 pens) into the skin every 30 days during cluster headache cycle and stop when cycle is over.   HYDROcodone-acetaminophen (NORCO) 7.5-325 MG tablet Take 1-2 tablets by mouth every 6 (six) hours as needed for moderate pain.   Melatonin 10 MG CAPS Take 10 capsules by mouth at bedtime.   metFORMIN (GLUCOPHAGE) 500 MG tablet TAKE ONE TABLET BY MOUTH THREE TIMES DAILY   PROLENSA 0.07 % SOLN Apply to eye.   riTUXimab-abbs (TRUXIMA) 100 MG/10ML injection Inject into the vein. Injection 3 times a year   tamsulosin (FLOMAX) 0.4 MG CAPS capsule TAKE ONE CAPSULE BY MOUTH DAILY   traZODone (DESYREL) 50 MG tablet Take 0.5-1 tablets (25-50 mg total) by mouth at bedtime.   Ubrogepant (UBRELVY) 100 MG TABS Take 100 mg by mouth every 2 (two) hours as needed. Maximum '200mg'$  a day. (Patient not  taking: Reported on 11/30/2022)   verapamil (CALAN-SR) 240 MG CR tablet Take 1 tablet (240 mg total) by mouth at bedtime. Please stop amlodipine.   No facility-administered encounter medications on file as of 02/15/2023.    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   Osteoarthritis    PAC (premature atrial contraction)    Statin intolerance    Type II or unspecified type diabetes mellitus without mention of complication, not stated as uncontrolled 2011   Vitamin B 12 deficiency 2011   Vitamin D deficiency    Vitiligo    Past Surgical History:  Procedure Laterality Date   arm surgery Left    ORIF left  forearm   CATARACT EXTRACTION  2012   EYE SURGERY     eye surgery Right 05/02/2022   remove scar on cornea and then put amniotic cells on it   eyelid surgery Right    HERNIA REPAIR Bilateral 1971   PARTIAL KNEE ARTHROPLASTY Left 09/14/2014   Procedure: LEFT UNICOMPARTMENTAL KNEE ARTHROPLASTY MEDIALLY;  Surgeon: Mauri Pole, MD;  Location: [facility];  Service: Orthopedics;  Laterality: Left;   TOTAL KNEE ARTHROPLASTY Right 06/15/2014   Procedure: RIGHT TOTAL KNEE ARTHROPLASTY;  Surgeon: Mauri Pole, MD;  Location: [facility];  Service: Orthopedics;  Laterality: Right;   Family History  Problem Relation Age of Onset   Arthritis Mother    Hypertension Father    Coronary artery disease Father    Hypertension Brother    Hypertension Other    Hypertension Brother    Coronary artery disease Other    Social History   Socioeconomic History   Marital status: Married    Spouse name: [name]   Number of children: Not on file   Years of education: Not on file   Highest education level: Not on file  Occupational History   Occupation: MD Retired  Tobacco Use   Smoking status: Former    Types: Cigars   Smokeless tobacco: Never   Tobacco comments:    3-4 cigars per year in the past but none in the past 10 years  Vaping Use   Vaping Use: Never used  Substance and Sexual Activity   Alcohol use: Not Currently    Alcohol/week: 0.0 - 1.0 standard drinks of alcohol    Comment: maybe 1 mixed drink maybe once a month   Drug use: No   Sexual activity: Not Currently  Other Topics Concern   Not on  file  Social History Narrative   Regular Exercise -  YES, golf   Lives at home with wife    Right handed   Caffeine:  1-2 cups/day   Social Determinants of Health   Financial Resource Strain: Low Risk  (02/15/2023)   Overall Financial Resource Strain (CARDIA)    Difficulty of Paying Living Expenses: Not hard at all  Food Insecurity: No Food Insecurity (02/15/2023)   Hunger Vital Sign    Worried About Running Out of Food in the Last Year: Never true    Ran Out of Food in the Last Year: Never true  Transportation Needs: No Transportation Needs (02/15/2023)   PRAPARE - Hydrologist (Medical): No  Lack of Transportation (Non-Medical): No  Physical Activity: Sufficiently Active (02/15/2023)   Exercise Vital Sign    Days of Exercise per Week: 5 days    Minutes of Exercise per Session: 60 min  Stress: No Stress Concern Present (02/15/2023)   [place]    Feeling of Stress : Not at all  Social Connections: [place] (02/15/2023)   Social Connection and Isolation Panel [NHANES]    Frequency of Communication with Friends and Family: More than three times a week    Frequency of Social Gatherings with Friends and Family: More than three times a week    Attends Religious Services: 1 to 4 times per year    Active Member of [employer] or Organizations: Yes    Attends Archivist Meetings: 1 to 4 times per year    Marital Status: Married    Tobacco Counseling Counseling given: Not Answered Tobacco comments: 3-4 cigars per year in the past but none in the past 10 years   Clinical Intake:  Pre-visit preparation completed: Yes  Pain : No/denies pain Pain Score: 0-No pain     BMI - recorded: 20.67 Nutritional Status: BMI of 19-24  Normal Nutritional Risks: None Diabetes: No  How often do you need to have someone help you when you read instructions, pamphlets, or other written  materials from your doctor or pharmacy?: 1 - Never 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  No  Does the patient have any non-healing wounds?  No  Has the patient had any unintentional weight loss or weight gain?  No   Diabetes:  Is the patient diabetic?  Yes  If diabetic, was a CBG obtained today?  No  Did the patient bring in their glucometer from home?  No  How often do you monitor your CBG's? None.   Financial Strains and Diabetes Management:  Are you having any financial strains with the device, your supplies or your medication? No .  Does the patient want to be seen by Chronic Care Management for management of their diabetes?  No  Would the patient like to be referred to a Nutritionist or for Diabetic Management?  No   Diabetic Exams:  Diabetic Eye Exam: Completed 12/27/2022 Diabetic Foot Exam: Overdue, Pt has been advised about the importance in completing this exam. Pt is scheduled for diabetic foot exam on 04/05/2023.   Interpreter Needed?: No  Information entered by :: Lisette Abu, LPN.   Activities of Daily Living    02/15/2023    4:11 PM  In your present state of health, do you have any difficulty performing the following activities:  Hearing? 0  Vision? 1  Difficulty concentrating or making decisions? 0  Walking or climbing stairs? 0  Dressing or bathing? 0  Doing errands, shopping? 1  Comment Patient does not drive due to eye condition.  Preparing Food and eating ? N  Using the Toilet? N  In the past six months, have you accidently leaked urine? N  Do you have problems with loss of bowel control? N  Managing your Medications? N  Managing your Finances? N  Housekeeping or managing your Housekeeping? N    Patient Care Team: [name], Evie Lacks, MD as PCP - General Calvert Cantor, MD (Ophthalmology) Juanita Craver, MD as Attending  Physician (Gastroenterology) Paralee Cancel, MD as Attending  Physician (Orthopedic Surgery) Josue Hector, MD as Consulting Physician (Cardiology) Ramonita Lab, Jovita Kussmaul, MD as Referring Physician (Ophthalmology)  Indicate any recent Medical Services you may have received from other than [facility] providers in the past year (date may be approximate).     Assessment:   This is a routine wellness examination for Louis Kemp.  Hearing/Vision screen Hearing Screening - Comments:: Patient has decreased hearing; has recently seen ENT for evaluation for hearing aids.  Patient will get hearing aids this week.  Vision Screening - Comments:: Wears rx glasses - up to date with routine eye exams with [facility] and [facility] for rare eye condition called, OCP.   Dietary issues and exercise activities discussed: Current Exercise Habits: Home exercise routine, Type of exercise: Other - see comments (stationary bike), Time (Minutes): 60, Frequency (Times/Week): 5, Weekly Exercise (Minutes/Week): 300, Intensity: Moderate, Exercise limited by: orthopedic condition(s);cardiac condition(s)   Goals Addressed             This Visit's Progress    My goal for 2024 is to stay healthy, continue my exercise regimen and to have stable vision.        Depression Screen    02/15/2023    4:19 PM 01/04/2023    1:17 PM 09/04/2022   11:29 AM 05/31/2022    3:55 PM 02/07/2022    1:43 PM 05/12/2021   10:27 AM 10/08/2019    1:18 PM  PHQ 2/9 Scores  PHQ - 2 Score 0 0 0 0 0 0 0  PHQ- 9 Score 1 1 0 2  0     Fall Risk    02/15/2023    4:08 PM 01/04/2023    1:17 PM 09/04/2022   11:29 AM 05/31/2022    3:56 PM 02/07/2022    1:43 PM  [place] in the past year? 0 0 0 0 0  Number falls in past yr: 0 0 0 0 0  Injury with Fall? 0 0 0 0   Risk for fall due to : No Fall Risks No Fall Risks Impaired balance/gait;Impaired mobility    Follow up Falls prevention discussed Falls evaluation completed        FALL RISK PREVENTION PERTAINING TO THE HOME:  Any stairs in or around the home? No  If so, are there any without handrails? No  Home free of loose throw rugs in walkways, pet beds, electrical cords, etc? Yes  Adequate lighting in your home to reduce risk of falls? Yes   ASSISTIVE DEVICES UTILIZED TO PREVENT FALLS:  Life alert? No  Use of a cane, walker or w/c? No  Grab bars in the bathroom? Yes  Shower chair or bench in shower? Yes  Elevated toilet seat or a handicapped toilet? Yes   TIMED UP AND GO:  Was the test performed? No . Telephonic Visit   Cognitive Function:        02/15/2023    4:24 PM  6CIT Screen  What Year? 0 points  What month? 0 points  What time? 0 points  Count back from 20 0 points  Months in reverse 0 points  Repeat phrase 0 points  Total Score 0 points    Immunizations Immunization History  Administered Date(s) Administered   Fluad Quad(high Dose 65+) 11/10/2020, 09/04/2022   Influenza Split 10/20/2011   Influenza Whole 10/20/2010, 09/14/2012   Influenza, High Dose Seasonal PF 09/26/2018, 09/25/2019   Influenza,inj,Quad PF,6+ Mos 09/30/2014   Influenza,inj,Quad PF,6-35 Mos  10/01/2021   Influenza-Unspecified 09/17/2013, 10/26/2015, 10/25/2016   PFIZER(Purple Top)SARS-COV-2 Vaccination 01/09/2020, 01/27/2020   Pfizer Covid-19 Vaccine Bivalent Booster 103yr & up 09/04/2022   Pneumococcal Conjugate-13 11/24/2013   Pneumococcal Polysaccharide-23 07/14/2010, 05/12/2021   Td 03/25/2014   Zoster Recombinat (Shingrix) 04/03/2018, 06/07/2018   Zoster, Live 07/14/2010    TDAP status: Up to date  Flu Vaccine status: Up to date  Pneumococcal vaccine status: Up to date  Covid-19 vaccine status: Completed vaccines  Qualifies for Shingles Vaccine? Yes   Zostavax completed Yes   Shingrix Completed?: Yes  Screening Tests Health Maintenance  Topic Date Due   FOOT EXAM  Never done   Diabetic kidney evaluation - Urine ACR  11/10/2021    OPHTHALMOLOGY EXAM  05/06/2022   COVID-19 Vaccine (4 - 2023-24 season) 10/30/2022   HEMOGLOBIN A1C  07/05/2023   Diabetic kidney evaluation - eGFR measurement  01/05/2024   Medicare Annual Wellness (AWV)  02/16/2024   DTaP/Tdap/Td (2 - Tdap) 03/25/2024   Pneumonia Vaccine 82 Years old  Completed   INFLUENZA VACCINE  Completed   Zoster Vaccines- Shingrix  Completed   HPV VACCINES  Aged Out    Health Maintenance  Health Maintenance Due  Topic Date Due   FOOT EXAM  Never done   Diabetic kidney evaluation - Urine ACR  11/10/2021   OPHTHALMOLOGY EXAM  05/06/2022   COVID-19 Vaccine (4 - 2023-24 season) 10/30/2022    Colorectal cancer screening: No longer required.   Lung Cancer Screening: (Low Dose CT Chest recommended if Age 662-80years, 30 pack-year currently smoking OR have quit w/in 15years.) does not qualify.   Lung Cancer Screening Referral: no  Additional Screening:  Hepatitis C Screening: does not qualify; Completed no  Vision Screening: Recommended annual ophthalmology exams for early detection of glaucoma and other disorders of the eye. Is the patient up to date with their annual eye exam?  Yes  Who is the provider or what is the name of the office in which the patient attends annual eye exams[place] is not established with a provider, would they like to be referred to a provider to establish care? No .   Dental Screening: Recommended annual dental exams for proper oral hygiene  Community Resource Referral / Chronic Care Management: CRR required this visit?  No   CCM required this visit?  No      Plan:     I have personally reviewed and noted the following in the patient's chart:   Medical and social history Use of alcohol, tobacco or illicit drugs  Current medications and supplements including opioid prescriptions. Patient is currently taking opioid prescriptions. Information provided to patient regarding non-opioid alternatives. Patient  advised to discuss non-opioid treatment plan with their provider. Functional ability and status Nutritional status Physical activity Advanced directives List of other physicians Hospitalizations, surgeries, and ER visits in previous 12 months Vitals Screenings to include cognitive, depression, and falls Referrals and appointments  In addition, I have reviewed and discussed with patient certain preventive protocols, quality metrics, and best practice recommendations. A written personalized care plan for preventive services as well as general preventive health recommendations were provided to patient.     SSheral Flow LPN   [id number]  Nurse Notes: Normal cognitive status assessed by direct observation by this Nurse Health Advisor. No abnormalities found.    Medical screening examination/treatment/procedure(s) were performed by non-physician practitioner and as supervising physician I was immediately available for consultation/collaboration.  I  agree with above. Lew Dawes, MD

## 2023-02-15 NOTE — Patient Instructions (Signed)
Mr. Louis Kemp , Thank you for taking time to come for your Medicare Wellness Visit. I appreciate your ongoing commitment to your health goals. Please review the following plan we discussed and let me know if I can assist you in the future.   These are the goals we discussed:  Goals      My goal for 2024 is to stay healthy, continue my exercise regimen and to have stable vision.        This is a list of the screening recommended for you and due dates:  Health Maintenance  Topic Date Due   Complete foot exam   Never done   Yearly kidney health urinalysis for diabetes  11/10/2021   Eye exam for diabetics  05/06/2022   COVID-19 Vaccine (4 - 2023-24 season) 10/30/2022   Hemoglobin A1C  07/05/2023   Yearly kidney function blood test for diabetes  01/05/2024   Medicare Annual Wellness Visit  02/16/2024   DTaP/Tdap/Td vaccine (2 - Tdap) 03/25/2024   Pneumonia Vaccine  Completed   Flu Shot  Completed   Zoster (Shingles) Vaccine  Completed   HPV Vaccine  Aged Out    Advanced directives: Yes  Conditions/risks identified: Yes; Type II Diabetes Mellitus  Next appointment: Follow up in one year for your annual wellness visit.   Preventive Care 60 Years and Older, Male  Preventive care refers to lifestyle choices and visits with your health care provider that can promote health and wellness. What does preventive care include? A yearly physical exam. This is also called an annual well check. Dental exams once or twice a year. Routine eye exams. Ask your health care provider how often you should have your eyes checked. Personal lifestyle choices, including: Daily care of your teeth and gums. Regular physical activity. Eating a healthy diet. Avoiding tobacco and drug use. Limiting alcohol use. Practicing safe sex. Taking low doses of aspirin every day. Taking vitamin and mineral supplements as recommended by your health care provider. What happens during an annual well check? The services  and screenings done by your health care provider during your annual well check will depend on your age, overall health, lifestyle risk factors, and family history of disease. Counseling  Your health care provider may ask you questions about your: Alcohol use. Tobacco use. Drug use. Emotional well-being. Home and relationship well-being. Sexual activity. Eating habits. History of falls. Memory and ability to understand (cognition). Work and work Statistician. Screening  You may have the following tests or measurements: Height, weight, and BMI. Blood pressure. Lipid and cholesterol levels. These may be checked every 5 years, or more frequently if you are over 64 years old. Skin check. Lung cancer screening. You may have this screening every year starting at age 76 if you have a 30-pack-year history of smoking and currently smoke or have quit within the past 15 years. Fecal occult blood test (FOBT) of the stool. You may have this test every year starting at age 53. Flexible sigmoidoscopy or colonoscopy. You may have a sigmoidoscopy every 5 years or a colonoscopy every 10 years starting at age 88. Prostate cancer screening. Recommendations will vary depending on your family history and other risks. Hepatitis C blood test. Hepatitis B blood test. Sexually transmitted disease (STD) testing. Diabetes screening. This is done by checking your blood sugar (glucose) after you have not eaten for a while (fasting). You may have this done every 1-3 years. Abdominal aortic aneurysm (AAA) screening. You may need this if you are  a current or former smoker. Osteoporosis. You may be screened starting at age 31 if you are at high risk. Talk with your health care provider about your test results, treatment options, and if necessary, the need for more tests. Vaccines  Your health care provider may recommend certain vaccines, such as: Influenza vaccine. This is recommended every year. Tetanus, diphtheria,  and acellular pertussis (Tdap, Td) vaccine. You may need a Td booster every 10 years. Zoster vaccine. You may need this after age 3. Pneumococcal 13-valent conjugate (PCV13) vaccine. One dose is recommended after age 72. Pneumococcal polysaccharide (PPSV23) vaccine. One dose is recommended after age 40. Talk to your health care provider about which screenings and vaccines you need and how often you need them. This information is not intended to replace advice given to you by your health care provider. Make sure you discuss any questions you have with your health care provider. Document Released: 01/07/2016 Document Revised: 08/30/2016 Document Reviewed: 10/12/2015 Elsevier Interactive Patient Education  2017 [employer] Prevention in the Home Falls can cause injuries. They can happen to people of all ages. There are many things you can do to make your home safe and to help prevent falls. What can I do on the outside of my home? Regularly fix the edges of walkways and driveways and fix any cracks. Remove anything that might make you trip as you walk through a door, such as a raised step or threshold. Trim any bushes or trees on the path to your home. Use bright outdoor lighting. Clear any walking paths of anything that might make someone trip, such as rocks or tools. Regularly check to see if handrails are loose or broken. Make sure that both sides of any steps have handrails. Any raised decks and porches should have guardrails on the edges. Have any leaves, snow, or ice cleared regularly. Use sand or salt on walking paths during winter. Clean up any spills in your garage right away. This includes oil or grease spills. What can I do in the bathroom? Use night lights. Install grab bars by the toilet and in the tub and shower. Do not use towel bars as grab bars. Use non-skid mats or decals in the tub or shower. If you need to sit down in the shower, use a plastic, non-slip  stool. Keep the floor dry. Clean up any water that spills on the floor as soon as it happens. Remove soap buildup in the tub or shower regularly. Attach bath mats securely with double-sided non-slip rug tape. Do not have throw rugs and other things on the floor that can make you trip. What can I do in the bedroom? Use night lights. Make sure that you have a light by your bed that is easy to reach. Do not use any sheets or blankets that are too big for your bed. They should not hang down onto the floor. Have a firm chair that has side arms. You can use this for support while you get dressed. Do not have throw rugs and other things on the floor that can make you trip. What can I do in the kitchen? Clean up any spills right away. Avoid walking on wet floors. Keep items that you use a lot in easy-to-reach places. If you need to reach something above you, use a strong step stool that has a grab bar. Keep electrical cords out of the way. Do not use floor polish or wax that makes floors slippery. If you must  use wax, use non-skid floor wax. Do not have throw rugs and other things on the floor that can make you trip. What can I do with my stairs? Do not leave any items on the stairs. Make sure that there are handrails on both sides of the stairs and use them. Fix handrails that are broken or loose. Make sure that handrails are as long as the stairways. Check any carpeting to make sure that it is firmly attached to the stairs. Fix any carpet that is loose or worn. Avoid having throw rugs at the top or bottom of the stairs. If you do have throw rugs, attach them to the floor with carpet tape. Make sure that you have a light switch at the top of the stairs and the bottom of the stairs. If you do not have them, ask someone to add them for you. What else can I do to help prevent falls? Wear shoes that: Do not have high heels. Have rubber bottoms. Are comfortable and fit you well. Are closed at the  toe. Do not wear sandals. If you use a stepladder: Make sure that it is fully opened. Do not climb a closed stepladder. Make sure that both sides of the stepladder are locked into place. Ask someone to hold it for you, if possible. Clearly mark and make sure that you can see: Any grab bars or handrails. First and last steps. Where the edge of each step is. Use tools that help you move around (mobility aids) if they are needed. These include: Canes. Walkers. Scooters. Crutches. Turn on the lights when you go into a dark area. Replace any light bulbs as soon as they burn out. Set up your furniture so you have a clear path. Avoid moving your furniture around. If any of your floors are uneven, fix them. If there are any pets around you, be aware of where they are. Review your medicines with your doctor. Some medicines can make you feel dizzy. This can increase your chance of falling. Ask your doctor what other things that you can do to help prevent falls. This information is not intended to replace advice given to you by your health care provider. Make sure you discuss any questions you have with your health care provider. Document Released: 10/07/2009 Document Revised: 05/18/2016 Document Reviewed: 01/15/2015 Elsevier Interactive Patient Education  2017 [employer].

## 2023-02-26 DIAGNOSIS — H02115 Cicatricial ectropion of left lower eyelid: Secondary | ICD-10-CM | POA: Diagnosis not present

## 2023-02-26 DIAGNOSIS — H0279 Other degenerative disorders of eyelid and periocular area: Secondary | ICD-10-CM | POA: Diagnosis not present

## 2023-02-26 DIAGNOSIS — H02112 Cicatricial ectropion of right lower eyelid: Secondary | ICD-10-CM | POA: Diagnosis not present

## 2023-02-28 ENCOUNTER — Other Ambulatory Visit: Payer: Self-pay | Admitting: *Deleted

## 2023-02-28 ENCOUNTER — Other Ambulatory Visit: Payer: Self-pay

## 2023-02-28 DIAGNOSIS — Z79899 Other long term (current) drug therapy: Secondary | ICD-10-CM | POA: Diagnosis not present

## 2023-02-28 DIAGNOSIS — L121 Cicatricial pemphigoid: Secondary | ICD-10-CM | POA: Diagnosis not present

## 2023-02-28 DIAGNOSIS — M316 Other giant cell arteritis: Secondary | ICD-10-CM | POA: Diagnosis not present

## 2023-03-01 LAB — T + B-LYMPHOCYTE DIFFERENTIAL
% CD 3 Pos. Lymph.: 85.5 % (ref 57.5–86.2)
% CD 4 Pos. Lymph.: 22.5 % — ABNORMAL LOW (ref 30.8–58.5)
Absolute CD 3: 3335 /uL — ABNORMAL HIGH (ref 622–2402)
Absolute CD 4 Helper: 878 /uL (ref 359–1519)
Basophils Absolute: 0.1 10*3/uL (ref 0.0–0.2)
Basos: 1 %
CD19 % B Cell: 0 % — ABNORMAL LOW (ref 3.3–25.4)
CD19 Abs: 0 /uL — ABNORMAL LOW (ref 12–645)
CD4/CD8 Ratio: 0.35 — ABNORMAL LOW (ref 0.92–3.72)
CD8 % Suppressor T Cell: 63.6 % — ABNORMAL HIGH (ref 12.0–35.5)
CD8 T Cell Abs: 2480 /uL — ABNORMAL HIGH (ref 109–897)
EOS (ABSOLUTE): 0.2 10*3/uL (ref 0.0–0.4)
Eos: 2 %
Hematocrit: 42.3 % (ref 37.5–51.0)
Hemoglobin: 14.1 g/dL (ref 13.0–17.7)
Immature Grans (Abs): 0.1 10*3/uL (ref 0.0–0.1)
Immature Granulocytes: 1 %
Lymphocytes Absolute: 3.9 10*3/uL — ABNORMAL HIGH (ref 0.7–3.1)
Lymphs: 46 %
MCH: 29.8 pg (ref 26.6–33.0)
MCHC: 33.3 g/dL (ref 31.5–35.7)
MCV: 89 fL (ref 79–97)
Monocytes Absolute: 0.6 10*3/uL (ref 0.1–0.9)
Monocytes: 8 %
Neutrophils Absolute: 3.4 10*3/uL (ref 1.4–7.0)
Neutrophils: 42 %
Platelets: 335 10*3/uL (ref 150–450)
RBC: 4.73 x10E6/uL (ref 4.14–5.80)
RDW: 12.7 % (ref 11.6–15.4)
WBC: 8.2 10*3/uL (ref 3.4–10.8)

## 2023-03-01 LAB — COMPREHENSIVE METABOLIC PANEL
ALT: 18 IU/L (ref 0–44)
AST: 27 IU/L (ref 0–40)
Albumin/Globulin Ratio: 2.1 (ref 1.2–2.2)
Albumin: 4.5 g/dL (ref 3.8–4.8)
Alkaline Phosphatase: 90 IU/L (ref 44–121)
BUN/Creatinine Ratio: 12 (ref 10–24)
BUN: 19 mg/dL (ref 8–27)
Bilirubin Total: 0.3 mg/dL (ref 0.0–1.2)
CO2: 22 mmol/L (ref 20–29)
Calcium: 9.7 mg/dL (ref 8.6–10.2)
Chloride: 103 mmol/L (ref 96–106)
Creatinine, Ser: 1.57 mg/dL — ABNORMAL HIGH (ref 0.76–1.27)
Globulin, Total: 2.1 g/dL (ref 1.5–4.5)
Glucose: 173 mg/dL — ABNORMAL HIGH (ref 70–99)
Potassium: 4.3 mmol/L (ref 3.5–5.2)
Sodium: 139 mmol/L (ref 134–144)
Total Protein: 6.6 g/dL (ref 6.0–8.5)
eGFR: 44 mL/min/{1.73_m2} — ABNORMAL LOW (ref >59)

## 2023-03-02 ENCOUNTER — Telehealth: Payer: Self-pay | Admitting: Internal Medicine

## 2023-03-02 NOTE — Telephone Encounter (Signed)
Pt called stating he needed to speak with nurse. Pt will not give me details.

## 2023-03-05 NOTE — Telephone Encounter (Signed)
Called pt he states he did take to MD over weekend. It was concerning his BP, but he is monitoring it. Has came down to 144. Will keep appt 04/05/23.Marland KitchenJohny Kemp

## 2023-03-12 ENCOUNTER — Other Ambulatory Visit: Payer: Self-pay | Admitting: Internal Medicine

## 2023-03-14 DIAGNOSIS — L121 Cicatricial pemphigoid: Secondary | ICD-10-CM | POA: Diagnosis not present

## 2023-03-14 DIAGNOSIS — M316 Other giant cell arteritis: Secondary | ICD-10-CM | POA: Diagnosis not present

## 2023-03-28 DIAGNOSIS — H1089 Other conjunctivitis: Secondary | ICD-10-CM | POA: Diagnosis not present

## 2023-03-28 DIAGNOSIS — H11233 Symblepharon, bilateral: Secondary | ICD-10-CM | POA: Diagnosis not present

## 2023-03-28 DIAGNOSIS — Z79899 Other long term (current) drug therapy: Secondary | ICD-10-CM | POA: Diagnosis not present

## 2023-04-05 ENCOUNTER — Encounter: Payer: Self-pay | Admitting: Internal Medicine

## 2023-04-05 ENCOUNTER — Ambulatory Visit (INDEPENDENT_AMBULATORY_CARE_PROVIDER_SITE_OTHER): Payer: Medicare Other | Admitting: Internal Medicine

## 2023-04-05 VITALS — BP 118/62 | HR 68 | Temp 98.2°F | Ht 67.0 in | Wt 134.0 lb

## 2023-04-05 DIAGNOSIS — E119 Type 2 diabetes mellitus without complications: Secondary | ICD-10-CM

## 2023-04-05 DIAGNOSIS — L121 Cicatricial pemphigoid: Secondary | ICD-10-CM

## 2023-04-05 DIAGNOSIS — Z7984 Long term (current) use of oral hypoglycemic drugs: Secondary | ICD-10-CM

## 2023-04-05 DIAGNOSIS — E785 Hyperlipidemia, unspecified: Secondary | ICD-10-CM

## 2023-04-05 DIAGNOSIS — I10 Essential (primary) hypertension: Secondary | ICD-10-CM

## 2023-04-05 DIAGNOSIS — Z7184 Encounter for health counseling related to travel: Secondary | ICD-10-CM | POA: Diagnosis not present

## 2023-04-05 LAB — CBC WITH DIFFERENTIAL/PLATELET
Basophils Absolute: 0 10*3/uL (ref 0.0–0.1)
Basophils Relative: 0.4 % (ref 0.0–3.0)
Eosinophils Absolute: 0.1 10*3/uL (ref 0.0–0.7)
Eosinophils Relative: 1 % (ref 0.0–5.0)
HCT: 42.2 % (ref 39.0–52.0)
Hemoglobin: 14.1 g/dL (ref 13.0–17.0)
Lymphocytes Relative: 31.2 % (ref 12.0–46.0)
Lymphs Abs: 2.9 10*3/uL (ref 0.7–4.0)
MCHC: 33.5 g/dL (ref 30.0–36.0)
MCV: 90.4 fl (ref 78.0–100.0)
Monocytes Absolute: 0.9 10*3/uL (ref 0.1–1.0)
Monocytes Relative: 9.1 % (ref 3.0–12.0)
Neutro Abs: 5.4 10*3/uL (ref 1.4–7.7)
Neutrophils Relative %: 58.3 % (ref 43.0–77.0)
Platelets: 374 10*3/uL (ref 150.0–400.0)
RBC: 4.67 Mil/uL (ref 4.22–5.81)
RDW: 14.1 % (ref 11.5–15.5)
WBC: 9.4 10*3/uL (ref 4.0–10.5)

## 2023-04-05 LAB — URINALYSIS
Bilirubin Urine: NEGATIVE
Hgb urine dipstick: NEGATIVE
Ketones, ur: NEGATIVE
Leukocytes,Ua: NEGATIVE
Nitrite: NEGATIVE
Specific Gravity, Urine: 1.025 (ref 1.000–1.030)
Total Protein, Urine: NEGATIVE
Urine Glucose: NEGATIVE
Urobilinogen, UA: 0.2 (ref 0.0–1.0)
pH: 5.5 (ref 5.0–8.0)

## 2023-04-05 LAB — COMPREHENSIVE METABOLIC PANEL
ALT: 18 U/L (ref 0–53)
AST: 22 U/L (ref 0–37)
Albumin: 4.5 g/dL (ref 3.5–5.2)
Alkaline Phosphatase: 78 U/L (ref 39–117)
BUN: 22 mg/dL (ref 6–23)
CO2: 27 mEq/L (ref 19–32)
Calcium: 10.3 mg/dL (ref 8.4–10.5)
Chloride: 103 mEq/L (ref 96–112)
Creatinine, Ser: 1.45 mg/dL (ref 0.40–1.50)
GFR: 45.35 mL/min — ABNORMAL LOW (ref >60.00)
Glucose, Bld: 100 mg/dL — ABNORMAL HIGH (ref 70–99)
Potassium: 4.2 mEq/L (ref 3.5–5.1)
Sodium: 139 mEq/L (ref 135–145)
Total Bilirubin: 0.4 mg/dL (ref 0.2–1.2)
Total Protein: 7.2 g/dL (ref 6.0–8.3)

## 2023-04-05 LAB — MICROALBUMIN / CREATININE URINE RATIO
Creatinine,U: 80.5 mg/dL
Microalb Creat Ratio: 1.5 mg/g (ref 0.0–30.0)
Microalb, Ur: 1.2 mg/dL (ref 0.0–1.9)

## 2023-04-05 LAB — HEMOGLOBIN A1C: Hgb A1c MFr Bld: 6.3 % (ref 4.6–6.5)

## 2023-04-05 LAB — TSH: TSH: 1.31 u[IU]/mL (ref 0.35–5.50)

## 2023-04-05 MED ORDER — ATOVAQUONE-PROGUANIL HCL 250-100 MG PO TABS: ORAL_TABLET | ORAL | 1 refills | Status: DC

## 2023-04-05 MED ORDER — DIPHENOXYLATE-ATROPINE 2.5-0.025 MG PO TABS: 1.0000 | ORAL_TABLET | Freq: Four times a day (QID) | ORAL | 0 refills | Status: DC | PRN

## 2023-04-05 MED ORDER — AZITHROMYCIN 250 MG PO TABS: ORAL_TABLET | ORAL | 0 refills | Status: DC

## 2023-04-05 MED ORDER — TYPHOID VACCINE PO CPDR: 1.0000 | DELAYED_RELEASE_CAPSULE | ORAL | 0 refills | Status: AC

## 2023-04-05 NOTE — Patient Instructions (Signed)
tDAP °

## 2023-04-05 NOTE — Progress Notes (Signed)
Subjective:  Patient ID: Louis Kemp, male    DOB: 02/16/1941  Age: 82 y.o. MRN: [id number]  CC: Follow-up (3 mnth f/u)   HPI BASILIOS BRABEC presents for HTN, DM, depression On infusion. Vision is 20/80 Traveling to [place]   Outpatient Medications Prior to Visit  Medication Sig Dispense Refill   Cholecalciferol (VITAMIN D) 2000 UNITS CAPS Take 1 capsule by mouth every other day.     cyanocobalamin (VITAMIN B12) 1000 MCG/ML injection INJECT 1 ML INTO THE MUSCLE EVERY 14 DAYS 10 mL 3   escitalopram (LEXAPRO) 5 MG tablet Take 1 tablet (5 mg total) by mouth at bedtime. 30 tablet 5   fenofibrate (TRICOR) 145 MG tablet Take 145 mg by mouth daily.     Galcanezumab-gnlm (EMGALITY, 300 MG DOSE,) 100 MG/ML SOSY Inject 300 mg (3 pens) into the skin every 30 days during cluster headache cycle and stop when cycle is over. 3 mL 11   HYDROcodone-acetaminophen (NORCO) 7.5-325 MG tablet Take 1-2 tablets by mouth every 6 (six) hours as needed for moderate pain. 60 tablet 0   Melatonin 10 MG CAPS Take 10 capsules by mouth at bedtime.     PROLENSA 0.07 % SOLN Apply to eye.     riTUXimab-abbs (TRUXIMA) 100 MG/10ML injection Inject into the vein. Injection 3 times a year     traZODone (DESYREL) 50 MG tablet Take 0.5-1 tablets (25-50 mg total) by mouth at bedtime. 90 tablet 3   metFORMIN (GLUCOPHAGE) 500 MG tablet TAKE ONE TABLET BY MOUTH THREE TIMES DAILY 270 tablet 1   tamsulosin (FLOMAX) 0.4 MG CAPS capsule TAKE ONE CAPSULE BY MOUTH DAILY 90 capsule 2   verapamil (CALAN-SR) 240 MG CR tablet Take 1 tablet (240 mg total) by mouth at bedtime. Please stop amlodipine. 90 tablet 2   Ubrogepant (UBRELVY) 100 MG TABS Take 100 mg by mouth every 2 (two) hours as needed. Maximum 200mg  a day. (Patient not taking: Reported on 11/30/2022) 4 tablet 0   No facility-administered medications prior to visit.    ROS: Review of Systems  Constitutional:  Negative for appetite change, fatigue and unexpected weight  change.  HENT:  Negative for congestion, nosebleeds, sneezing, sore throat and trouble swallowing.   Eyes:  Positive for visual disturbance. Negative for itching.  Respiratory:  Negative for cough.   Cardiovascular:  Negative for chest pain, palpitations and leg swelling.  Gastrointestinal:  Negative for abdominal distention, blood in stool, diarrhea and nausea.  Genitourinary:  Negative for frequency and hematuria.  Musculoskeletal:  Negative for back pain, gait problem, joint swelling and neck pain.  Skin:  Negative for rash.  Neurological:  Negative for dizziness, tremors, speech difficulty and weakness.  Psychiatric/Behavioral:  Negative for agitation, dysphoric mood and sleep disturbance. The patient is not nervous/anxious.     Objective:  BP 118/62 (BP Location: Right Arm, Patient Position: Sitting, Cuff Size: Large)   Pulse 68   Temp 98.2 F (36.8 C) (Oral)   Ht 5\' 7"  (1.702 m)   Wt 134 lb (60.8 kg)   SpO2 98%   BMI 20.99 kg/m   BP Readings from Last 3 Encounters:  04/05/23 118/62  01/04/23 122/74  11/30/22 125/74    Wt Readings from Last 3 Encounters:  04/05/23 134 lb (60.8 kg)  02/15/23 132 lb (59.9 kg)  01/04/23 132 lb (59.9 kg)    Physical Exam Constitutional:      General: He is not in acute distress.    Appearance: Normal  appearance. He is well-developed.     Comments: NAD  Eyes:     Conjunctiva/sclera: Conjunctivae normal.     Pupils: Pupils are equal, round, and reactive to light.  Neck:     Thyroid: No thyromegaly.     Vascular: No JVD.  Cardiovascular:     Rate and Rhythm: Normal rate and regular rhythm.     Heart sounds: Normal heart sounds. No murmur heard.    No friction rub. No gallop.  Pulmonary:     Effort: Pulmonary effort is normal. No respiratory distress.     Breath sounds: Normal breath sounds. No wheezing or rales.  Chest:     Chest wall: No tenderness.  Abdominal:     General: Bowel sounds are normal. There is no distension.      Palpations: Abdomen is soft. There is no mass.     Tenderness: There is no abdominal tenderness. There is no guarding or rebound.  Musculoskeletal:        General: No tenderness. Normal range of motion.     Cervical back: Normal range of motion.  Lymphadenopathy:     Cervical: No cervical adenopathy.  Skin:    General: Skin is warm and dry.     Findings: No rash.  Neurological:     Mental Status: He is alert and oriented to person, place, and time.     Cranial Nerves: No cranial nerve deficit.     Motor: No abnormal muscle tone.     Coordination: Coordination normal.     Gait: Gait normal.     Deep Tendon Reflexes: Reflexes are normal and symmetric.  Psychiatric:        Behavior: Behavior normal.        Thought Content: Thought content normal.        Judgment: Judgment normal.   Decreased vision  Lab Results  Component Value Date   WBC 9.4 04/05/2023   HGB 14.1 04/05/2023   HCT 42.2 04/05/2023   PLT 374.0 04/05/2023   GLUCOSE 100 (H) 04/05/2023   CHOL 161 05/31/2022   TRIG 108.0 05/31/2022   HDL 54.80 05/31/2022   LDLCALC 85 05/31/2022   ALT 18 04/05/2023   AST 22 04/05/2023   NA 139 04/05/2023   K 4.2 04/05/2023   CL 103 04/05/2023   CREATININE 1.45 04/05/2023   BUN 22 04/05/2023   CO2 27 04/05/2023   TSH 1.31 04/05/2023   PSA 0.78 01/04/2023   INR 1.01 09/03/2014   HGBA1C 6.3 04/05/2023   MICROALBUR 1.2 04/05/2023    MR BRAIN/IAC W WO CONTRAST  Result Date: 07/31/2022 CLINICAL DATA:  Provided history: Hearing loss. Additional history provided by scanning technologist: Patient reports left greater than right hearing loss for 6 months. Blurred vision. EXAM: MRI HEAD WITHOUT AND WITH CONTRAST TECHNIQUE: Multiplanar, multiecho pulse sequences of the brain and surrounding structures were obtained without and with intravenous contrast. CONTRAST:  12mL MULTIHANCE GADOBENATE DIMEGLUMINE 529 MG/ML IV SOLN COMPARISON:  CT of the orbits 06/20/2021. Brain MRI 01/05/2021.  FINDINGS: Intermittently motion degraded examination. Most notably, there is mild-to-moderate motion degradation of the axial high-resolution heavily T2 weighted sequence through the internal auditory canals and moderate motion degradation of the whole brain axial T1 weighted post-contrast sequence. Brain: Mild to moderate generalized parenchymal atrophy. Multifocal T2 FLAIR hyperintense signal abnormality within the cerebral white matter, nonspecific but compatible with mild chronic small vessel ischemic disease. No evidence of an intracranial mass. Specifically, no cerebellopontine angle or internal auditory canal  mass is demonstrated. Unremarkable appearance of the 7th and 8th cranial nerves bilaterally. There is no acute infarct. No chronic intracranial blood products. No extra-axial fluid collection. No midline shift. No pathologic intracranial enhancement identified. Vascular: Maintained flow voids within the proximal large arterial vessels. Skull and upper cervical spine: No focal suspicious marrow lesion. Incompletely assessed cervical spondylosis. Sinuses/Orbits: No mass or acute finding within the imaged orbits. Prior bilateral ocular lens replacement. Small mucous retention cysts within the bilateral maxillary sinuses. Trace mucosal thickening within the bilateral ethmoid sinuses. IMPRESSION: Intermittently motion degraded examination. No evidence of acute intracranial abnormality. No cerebellopontine angle or internal auditory canal mass. Mild chronic small vessel ischemic changes within the cerebral white matter, similar to the prior brain MRI of 01/05/2021. Mild-to-moderate generalized parenchymal atrophy. Mild paranasal sinus disease, as described. Electronically Signed   By: Jackey Loge D.O.   On: 07/31/2022 08:01    Assessment & Plan:   Problem List Items Addressed This Visit     Diabetes type 2, controlled (HCC)    Check A1c      Relevant Orders   Comprehensive metabolic panel  (Completed)   CBC with Differential/Platelet (Completed)   Hemoglobin A1c (Completed)   Urinalysis (Completed)   Microalbumin / creatinine urine ratio (Completed)   Dyslipidemia   Relevant Orders   TSH (Completed)   Hypertension   Relevant Orders   Comprehensive metabolic panel (Completed)   CBC with Differential/Platelet (Completed)   Hemoglobin A1c (Completed)   Urinalysis (Completed)   Microalbumin / creatinine urine ratio (Completed)   Ocular pemphigoid    On Losartan eye drops      Travel advice encounter - Primary    Travel to [place] 3.5 weeks Given a prescription for Z-Pak, prescription for Malarone, typhoid oral vaccine capsules Lomotil as needed         Meds ordered this encounter  Medications   typhoid (VIVOTIF) DR capsule    Sig: Take 1 capsule by mouth every other day.    Dispense:  4 capsule    Refill:  0    Take ac w/water   azithromycin (ZITHROMAX Z-PAK) 250 MG tablet    Sig: As directed    Dispense:  6 tablet    Refill:  0   atovaquone-proguanil (MALARONE) 250-100 MG TABS tablet    Sig: Start 1 tablet a day one day prior to your trip.  Take 1 tablet daily while traveling.  Continue 1 tablet daily for 1 week after you returned, then stop.    Dispense:  40 tablet    Refill:  1   diphenoxylate-atropine (LOMOTIL) 2.5-0.025 MG tablet    Sig: Take 1-2 tablets by mouth 4 (four) times daily as needed for diarrhea or loose stools.    Dispense:  60 tablet    Refill:  0      Follow-up: Return in about 4 months (around 08/05/2023) for a follow-up visit.  Sonda Primes, MD

## 2023-04-05 NOTE — Assessment & Plan Note (Addendum)
Travel to [facility] 3.5 weeks Given a prescription for Z-Pak, prescription for Malarone, typhoid oral vaccine capsules Lomotil as needed

## 2023-04-06 DIAGNOSIS — H11233 Symblepharon, bilateral: Secondary | ICD-10-CM | POA: Diagnosis not present

## 2023-04-06 DIAGNOSIS — H02051 Trichiasis without entropian right upper eyelid: Secondary | ICD-10-CM | POA: Diagnosis not present

## 2023-04-06 DIAGNOSIS — H02054 Trichiasis without entropian left upper eyelid: Secondary | ICD-10-CM | POA: Diagnosis not present

## 2023-04-06 DIAGNOSIS — H5711 Ocular pain, right eye: Secondary | ICD-10-CM | POA: Diagnosis not present

## 2023-04-07 ENCOUNTER — Other Ambulatory Visit: Payer: Self-pay | Admitting: Internal Medicine

## 2023-04-13 DIAGNOSIS — H02051 Trichiasis without entropian right upper eyelid: Secondary | ICD-10-CM | POA: Diagnosis not present

## 2023-04-13 DIAGNOSIS — H11233 Symblepharon, bilateral: Secondary | ICD-10-CM | POA: Diagnosis not present

## 2023-04-27 ENCOUNTER — Other Ambulatory Visit: Payer: Self-pay | Admitting: Internal Medicine

## 2023-04-28 NOTE — Addendum Note (Signed)
Addended by: [name] on: 04/28/2023 09:04 PM   Modules accepted: Level of Service

## 2023-04-28 NOTE — Assessment & Plan Note (Signed)
Check A1c. 

## 2023-04-28 NOTE — Assessment & Plan Note (Signed)
On Losartan eye drops

## 2023-05-14 DIAGNOSIS — H02115 Cicatricial ectropion of left lower eyelid: Secondary | ICD-10-CM | POA: Diagnosis not present

## 2023-05-14 DIAGNOSIS — H11233 Symblepharon, bilateral: Secondary | ICD-10-CM | POA: Diagnosis not present

## 2023-05-14 DIAGNOSIS — H02051 Trichiasis without entropian right upper eyelid: Secondary | ICD-10-CM | POA: Diagnosis not present

## 2023-05-29 DIAGNOSIS — H903 Sensorineural hearing loss, bilateral: Secondary | ICD-10-CM | POA: Diagnosis not present

## 2023-05-30 ENCOUNTER — Ambulatory Visit: Payer: Medicare Other | Admitting: Neurology

## 2023-05-30 ENCOUNTER — Encounter: Payer: Self-pay | Admitting: Neurology

## 2023-05-30 VITALS — BP 140/70 | HR 67 | Ht 67.0 in | Wt 134.4 lb

## 2023-05-30 DIAGNOSIS — G8929 Other chronic pain: Secondary | ICD-10-CM | POA: Diagnosis not present

## 2023-05-30 DIAGNOSIS — G44019 Episodic cluster headache, not intractable: Secondary | ICD-10-CM

## 2023-05-30 DIAGNOSIS — L121 Cicatricial pemphigoid: Secondary | ICD-10-CM

## 2023-05-30 DIAGNOSIS — M545 Low back pain, unspecified: Secondary | ICD-10-CM | POA: Diagnosis not present

## 2023-05-30 MED ORDER — HYDROCODONE-ACETAMINOPHEN 7.5-325 MG PO TABS: 1.0000 | ORAL_TABLET | Freq: Four times a day (QID) | ORAL | 0 refills | Status: DC | PRN

## 2023-05-30 MED ORDER — METHOCARBAMOL 500 MG PO TABS: 500.0000 mg | ORAL_TABLET | Freq: Four times a day (QID) | ORAL | 11 refills | Status: DC | PRN

## 2023-05-30 NOTE — Progress Notes (Signed)
[facility]    Provider:  [name] Requesting Provider: [name], [name], MD Primary Care Provider:  [name], MD  CC:  right cluster headache and on rituximab for Ocular cicatricial pemphigoid,   05/30/2023: Cluster headaches came back, increased to 2 emgality and working. He is being treated by [facility] and now his Dr. [name] ophthalmologist left and being treated by Dr. [name].   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 Ultimately, patients affected by this autoimmune disease will experience conjunctival cicatrization or scarring.He may need to be on Rituximab for life, has had a great response to rituximab, his vision is stable except as he gets closer to needigng his injection, vision is still poor but stable, mild lacrimation, stable vision unless getting close to his injection which means he is still needing his injections every 4 months since he seems to start to wrosen blurry vision if he does not, otherwise does fantastic on rituximab life changing.  Per ophthalmologist at [facility] he is to stay on rituximab as they feel stopping now would worsen ocular pemphigoid, not on restasis, he has Cicatrizing conjunctivitis,  Ocular cicatricial pemphigoid, he is to continue the same therapy, he has some irritation or dryness but he uses drops every day prednisone drops and tear drops, he does feel worse right before getting the infusion he start s more blurred vision right before the rituximab infusion. He has significant corneal scarring limiting vision from his condition which will worsen if the infusion is discontinued.  Continue rituximab q33m. His hearing was impaired and CT scan did not show etiology/ENT and he has hearing aids. Left eye 20/200, right eye is 20/80. He is very positive. He wears a contact in the right eye.   He exercises on stationary bike. He feels  a little fatigue but still exercises. Will forward all labs to Dr. [name] every 6 months.   He has chronic back pain and is going to [place] for 3 weeks can refill his opioid and robaxin as needed he has taken them before  Reviewed notes, labs and imaging from outside physicians, which showed:  07/2022: EXAM: MRI HEAD WITHOUT AND WITH CONTRAST   TECHNIQUE: Multiplanar, multiecho pulse sequences of the brain and surrounding structures were obtained without and with intravenous contrast.   CONTRAST:  12mL MULTIHANCE GADOBENATE DIMEGLUMINE 529 MG/ML IV SOLN   COMPARISON:  CT of the orbits 06/20/2021. Brain MRI 01/05/2021.   FINDINGS: Intermittently motion degraded examination. Most notably, there is mild-to-moderate motion degradation of the axial high-resolution heavily T2 weighted sequence through the internal auditory canals and moderate motion degradation of the whole brain axial T1 weighted post-contrast sequence.   Brain:   Mild to moderate generalized parenchymal atrophy.   Multifocal T2 FLAIR hyperintense signal abnormality within the cerebral white matter, nonspecific but compatible with mild chronic small vessel ischemic disease.   No evidence of an intracranial mass. Specifically, no cerebellopontine angle or internal auditory canal mass is demonstrated. Unremarkable appearance of the 7th and 8th cranial nerves bilaterally.   There is no acute infarct.   No chronic intracranial blood products.   No extra-axial fluid collection.   No midline shift.   No pathologic intracranial enhancement identified.   Vascular: Maintained flow voids within the proximal large arterial vessels.   Skull and upper cervical spine: No focal suspicious marrow lesion. Incompletely assessed cervical spondylosis.   Sinuses/Orbits: No mass or acute finding within the  imaged orbits. Prior bilateral ocular lens replacement. Small mucous retention cysts within the bilateral maxillary  sinuses. Trace mucosal thickening within the bilateral ethmoid sinuses.   IMPRESSION: Intermittently motion degraded examination.   No evidence of acute intracranial abnormality.   No cerebellopontine angle or internal auditory canal mass.   Mild chronic small vessel ischemic changes within the cerebral white matter, similar to the prior brain MRI of 01/05/2021.   Mild-to-moderate generalized parenchymal atrophy.   Mild paranasal sinus disease, as described.    Dr. [name] ophthalmology's last note:   Current meds:  -Rituximab q49m -PF 2/2 -HyloTear -Daily soft CTL  Previous:  -Restasis (allergic reaction) -Sclerals (did not tolerate, got RCE)  Assessment: Cicatrizing conjunctivitis, clinical suspicion for Ocular cicatricial pemphigoid -Quiet OU, good response to Rtx. Continue same therapy. -symptoms of irritation and dryness after YAG capsulotomy in 09/2019 Dr. [name] -Conj biopsy 2/2: No diagnostic deposits were seen, only a strong linear band of fibrin at the basement membrane -Additionally, Quantiferon TB Positive (history of BCG vaccination)  Today 03/28/2023 S/p SK and BrightStar placement (09/07/2022, [name]) Stable without epi defect Significant corneal scarring limiting vision Upper lid trichiasis today  Symblepharon of both eyes - 09/05/21: s/p right lower eyelid ectropion and right upper and lower punctoplasty with excision of caruncle and AMT with sutures, with right upper eyelid entropion repair, and left lower eyelid punctoplasty.  Plan: - pulled multiple lashes out of RUL  - Continue PF 2/2 - Continue Losartan 4/4  - Continue rituximab q20m (administered by Dr. [name]) - discussed PRGF/serum tears  Finish losartan then rx PRGF  Follow up in 4 months   Patient complains of symptoms per HPI as well as the following symptoms: back pain . Pertinent negatives and positives per HPI. All others negative  11/2022: He is significantly improved. No  headaches, vision is better. He wakes up every 2 hours. Then it takes him time to sleep. He wakes really. Not taking naps. He goes to bed 930-10am. No snoring. Also difficulty initiating sleep.   He is improved, he had eye surgery on the cornea and he is feeling better with his vision. Light doesn't bother him anymore. They saw Dr. [name] yesterday. He has not had a headache in 3 months. He is having hearing problems, his left ear is worsening, I am not awareof any ototoxicity with rituximab will ave him discuss with Dr. [name] at [facility]. His wife is here and provides information. He tried to go down to 2 emgality and had a cluster headache, went back to 3.  Patient complains of symptoms per HPI as well as the following symptoms: left ear decreased hearing . Pertinent negatives and positives per HPI. All others negative   02/09/2022: Tremendous improvement on emgality from daily severe multiple episodes of cluster headaches to 2x a month. Still having the headaches twice a month. It may last for 6 hours or 8 hours. We discussed options, continue taking Emgality, his vision is better slightly, he continues on Rituximab in out office and we monitor his labs and send them to Dr. [name] ophthalmology at [facility]. We discussed acute management when he does have headaches, discussed options and decided: At onset of headache try Ubrelvy with norco at onset of headache. Can repeat ubrelvy 2 hours later if needed Can also try injectable Zembrace with the norco: Take one injection every 15 minutes if needed max 4 in one day  10/10/2021: He is doing great, just a few headaches, doing well on verapamil and  emgality, he had some corneal scarring and he had aprocedure which is helping. Follow up phone visit in 3 months  Interval history: Patient her etoday, he is feeling much better. We have him on verapamil and have given him Emaglity cluster headache dose (today will be the second dose). Lidocaine spray, oxygen, nerve block,  sphenopalatine blocks did not last.   Interval history: Patient here for intractable episodic cluster headaches in the setting of very rare ocular pemphigoid.  He has intractable periorbital pain in the right with unilateral lacrimation and rhinorrhea.  Initially thought this might be a secondary headache from the very rare ocular pemphigoid, but at this time it appears he has concomitant cluster headaches; the rarity of ocular pemphigoid in addition with cluster headaches is quite unusual.  Today he has ptosis, likely from botox. Botox x 2 has not helped.  We have tried oral triptans, Nurtec, indomethacin, and none have relieved his symptoms.  Indomethacin was titrated up to 150 mg a day does not appear to be an indomethacin responsive headache but can try going up to 225 mg a day.  We have tried him on Topamax, Zonegran, high-dose steroids for over a month.  We have discontinued his amlodipine and carvedilol and we started verapamil.  Today we also injected him with Emgality.  I performed nerve blocks as well as sphenopalatine ganglion block.  Our plan is to do the following:  Interval history: The patient describes a headache primarily as a constant boring pain.  Certainly, the possibility of hemicrania continua does need to be considered or Cluster headache.  A trial indomethacin(or verapamil or Emgality) may be of some diagnostic benefit. He did not tolerate topamax but may have helped, neither tolerated zonegran, we have tried botox and will repeat, he declines depakote. Will titrate him off of prenisone and try Indomethacin. If no benefit will try Emgality or Verapamil.   Physially he is better, more energy, still having some redness of the right eye but maybe slightly improved, vision has not improved, Stopped the cellcept and on the rituximab.   Interval history: The pain is on the right temple, it happens in the afternoon or overnight about the same time every day, right eye waters(both eyes  water), other wise headache wakes him up if he does not take medication, his blood pressure has been a little low he was feeling weak and it was 108 systolic. Taking the amlodipine at 6pm, taking the carvedilol 6-7a, 10pm. BP 108/77 and tachycardic. He took gatorade yesterday and he was better but this morning again his BP was a little low. We decided to do nerve blocks today. We discussed topamax, CGRP meds, unfrotunately difficult to assess and treat unclear etiology of headaches - associated with his pemphigoid eye disease or is this an independent headache syndrome with associated autonomic symptoms? If no improvement with rituximab this Thursday we will change course.   Interval history 01/24/2021: I spoke to patient's [facility] physician, we are going to try and get infusion here in the office. He also continues on Prednisone. Temple pain/headache is 85% better but he still has pain supraorbitally. Only at night or in the afternoon. But he is very improved. We discussed titrating the steroids and we will stay on 60mg  for one month then slowly decrease by 10mg  monthly. We discussed checking hgba1c and his glucose, I recommended he check his glucose daily, he is feeling well, no problems, his eyes feel much better and are not as red or painful.  We received some notes from Dr. [name], but no orders, I have contacted him to get specifics on the pre-treatment, the injection doses and any labwork he wants prior to infusions. He is taking omeprazole daily, taking steroids with food as well, no GI upset.   HPI:  Louis Kemp is a 82 y.o. male here as requested by [name], [name], MD for headaches. PMHx ocular pemphigoid, B12 deficiency, HTN, PAC, DM2. New onset right temporal headache, continuous, sever at times.  Headaches started 2 weeks ago, it wasn't that bad but it is worsening and now can be severe, it is in the temple and radiates to the temple and frontal area. It becomes quite severe 7-8/10. Sharp pain,  shooting pain, he had prior headache in the left eye which was not similar it was ocular pain his eye was so inflamed due to the Pemphigoid. This is not the same, this is new, and is not anything he has ever experienced. He has noticed in the last 4-5 days when he eats he has jaw pain. The right eye vision is worsening. No significant history of migraines. No other significant history of headaches, he had one migraine in the remote past and this is nothing like it. The pain is continuous, no known triggers. Alleve is not working. Had a long discussion with patient regarding possiibilites and differential. No other focal neurologic deficits, associated symptoms, inciting events or modifiable factors.  Reviewed notes, labs and imaging from outside physicians, which showed:  Personally reviewed imaging with Dr. Janalyn Shy Long(Neuroradiology) and agree with the following:mild T2 hyperintensities, mild cerebral atrophy, otherwise unremarkable for age.   11/10/2020: TSH nml, B12 1056, cbc unremarkable, CMP elevated glucose and reduced GFR 52 otherwise normal Review of Systems: Patient complains of symptoms per HPI as well as the following symptoms: headache, vision changes . Pertinent negatives and positives per HPI. All others negative    Social History   Socioeconomic History   Marital status: Married    Spouse name: [name]   Number of children: Not on file   Years of education: Not on file   Highest education level: Not on file  Occupational History   Occupation: MD Retired  Tobacco Use   Smoking status: Former    Types: Cigars   Smokeless tobacco: Never   Tobacco comments:    3-4 cigars per year in the past but none in the past 10 years  Vaping Use   Vaping Use: Never used  Substance and Sexual Activity   Alcohol use: Not Currently    Alcohol/week: 0.0 - 1.0 standard drinks of alcohol    Comment: maybe 1 mixed drink maybe once a month   Drug use: No   Sexual activity: Not Currently   Other Topics Concern   Not on file  Social History Narrative   Regular Exercise -  YES, golf   Lives at home with wife    Right handed   Caffeine:  1-2 cups/day   Social Determinants of Health   Financial Resource Strain: Low Risk  (02/15/2023)   Overall Financial Resource Strain (CARDIA)    Difficulty of Paying Living Expenses: Not hard at all  Food Insecurity: No Food Insecurity (02/15/2023)   Hunger Vital Sign    Worried About Running Out of Food in the Last Year: Never true    Ran Out of Food in the Last Year: Never true  Transportation Needs: No Transportation Needs (02/15/2023)   PRAPARE - Transportation    Lack  of Transportation (Medical): No    Lack of Transportation (Non-Medical): No  Physical Activity: Sufficiently Active (02/15/2023)   Exercise Vital Sign    Days of Exercise per Week: 5 days    Minutes of Exercise per Session: 60 min  Stress: No Stress Concern Present (02/15/2023)   [employer] of Occupational Health - Occupational Stress Questionnaire    Feeling of Stress : Not at all  Social Connections: Socially Integrated (02/15/2023)   Social Connection and Isolation Panel [NHANES]    Frequency of Communication with Friends and Family: More than three times a week    Frequency of Social Gatherings with Friends and Family: More than three times a week    Attends Religious Services: 1 to 4 times per year    Active Member of Clubs or Organizations: Yes    Attends Banker Meetings: 1 to 4 times per year    Marital Status: Married  Catering manager Violence: Not At Risk (02/15/2023)   Humiliation, Afraid, Rape, and Kick questionnaire    Fear of Current or Ex-Partner: No    Emotionally Abused: No    Physically Abused: No    Sexually Abused: No    Family History  Problem Relation Age of Onset   Arthritis Mother    Hypertension Father    Coronary artery disease Father    Hypertension Brother    Hypertension Other    Hypertension Brother     Coronary artery disease Other     Past Medical History:  Diagnosis Date   BPH (benign prostatic hypertrophy)    Hyperlipidemia    Hypertension    LBP (low back pain)    MVP (mitral valve prolapse)    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   on cellcept   Osteoarthritis    PAC (premature atrial contraction)    Statin intolerance    Type II or unspecified type diabetes mellitus without mention of complication, not stated as uncontrolled 2011   Vitamin B 12 deficiency 2011   Vitamin D deficiency    Vitiligo     Patient Active Problem List   Diagnosis Date Noted   Cicatricial ocular pemphigoid 05/30/2023   Travel advice encounter 04/05/2023   Diverticular disease of colon 01/04/2023   Gastroesophageal reflux disease 01/04/2023   Iron deficiency anemia 01/04/2023   Loss of appetite 01/04/2023   Personal history of colonic polyps 01/04/2023   Long-term use of high-risk medication 12/03/2022   Asymmetric SNHL (sensorineural hearing loss) 07/14/2022   Presbycusis of both ears 07/14/2022   Hearing loss 05/31/2022   Pseudophakia of both eyes 07/12/2021   Malnutrition (HCC) 06/23/2021   Episodic cluster headache, not intractable 06/09/2021   Ocular pemphigoid 05/19/2021   Chronic sinusitis 05/12/2021   Depression 05/12/2021   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   Right sided temporal headache 01/06/2021   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   Chronic renal insufficiency, stage 3 (moderate) (HCC) 05/11/2020   Pemphigoid 05/10/2020   Elevated serum creatinine 07/11/2018   Hip pain, acute, right 01/10/2018   Postoperative anemia due to acute blood loss 09/30/2014   Status post left partial knee replacement 09/15/2014   S/P left UKR 09/14/2014   S/P right TKA 06/15/2014   Osteoarthritis, knee 11/24/2013   Weight loss 06/24/2013   PAC (premature atrial contraction) 01/13/2013   Heart murmur 01/13/2013   Well adult exam 11/03/2012   Paresthesia 10/22/2012    Hypertension 04/18/2012   Diabetes type 2, controlled (HCC) 10/20/2010   B12 deficiency 10/20/2010  Vitamin D deficiency 10/20/2010   Dyslipidemia 07/14/2010   BENIGN PROSTATIC HYPERTROPHY 07/14/2010   OSTEOARTHRITIS 07/14/2010   LOW BACK PAIN 07/14/2010   HYPERGLYCEMIA 07/14/2010    Past Surgical History:  Procedure Laterality Date   arm surgery Left    ORIF left  forearm   CATARACT EXTRACTION  2012   EYE SURGERY     eye surgery Right 05/02/2022   remove scar on cornea and then put amniotic cells on it   eyelid surgery Right    HERNIA REPAIR Bilateral 1971   PARTIAL KNEE ARTHROPLASTY Left 09/14/2014   Procedure: LEFT UNICOMPARTMENTAL KNEE ARTHROPLASTY MEDIALLY;  Surgeon: Shelda Pal, MD;  Location: [facility];  Service: Orthopedics;  Laterality: Left;   TOTAL KNEE ARTHROPLASTY Right 06/15/2014   Procedure: RIGHT TOTAL KNEE ARTHROPLASTY;  Surgeon: Shelda Pal, MD;  Location: [facility];  Service: Orthopedics;  Laterality: Right;    Current Outpatient Medications  Medication Sig Dispense Refill   atovaquone-proguanil (MALARONE) 250-100 MG TABS tablet Start 1 tablet a day one day prior to your trip.  Take 1 tablet daily while traveling.  Continue 1 tablet daily for 1 week after you returned, then stop. 40 tablet 1   azithromycin (ZITHROMAX Z-PAK) 250 MG tablet As directed 6 tablet 0   Cholecalciferol (VITAMIN D) 2000 UNITS CAPS Take 1 capsule by mouth every other day.     cyanocobalamin (VITAMIN B12) 1000 MCG/ML injection INJECT 1 ML INTO THE MUSCLE EVERY 14 DAYS 10 mL 3   diphenoxylate-atropine (LOMOTIL) 2.5-0.025 MG tablet Take 1-2 tablets by mouth 4 (four) times daily as needed for diarrhea or loose stools. 60 tablet 0   escitalopram (LEXAPRO) 5 MG tablet Take 1 tablet (5 mg total) by mouth at bedtime. 30 tablet 5   fenofibrate (TRICOR) 145 MG tablet Take 145 mg by mouth daily.     Galcanezumab-gnlm (EMGALITY, 300 MG DOSE,) 100 MG/ML SOSY Inject 300 mg (3 pens) into the skin every  30 days during cluster headache cycle and stop when cycle is over. 3 mL 11   Melatonin 10 MG CAPS Take 10 capsules by mouth at bedtime.     metFORMIN (GLUCOPHAGE) 500 MG tablet TAKE ONE TABLET BY MOUTH THREE TIMES DAILY 270 tablet 1   methocarbamol (ROBAXIN) 500 MG tablet Take 1 tablet (500 mg total) by mouth 4 (four) times daily as needed for muscle spasms. 120 tablet 11   PROLENSA 0.07 % SOLN Apply to eye.     riTUXimab-abbs (TRUXIMA) 100 MG/10ML injection Inject into the vein. Injection 3 times a year     tamsulosin (FLOMAX) 0.4 MG CAPS capsule TAKE ONE CAPSULE BY MOUTH DAILY 90 capsule 1   traZODone (DESYREL) 50 MG tablet Take 0.5-1 tablets (25-50 mg total) by mouth at bedtime. 90 tablet 3   typhoid (VIVOTIF) DR capsule Take 1 capsule by mouth every other day. 4 capsule 0   verapamil (CALAN-SR) 240 MG CR tablet Take 1 tablet (240 mg total) by mouth at bedtime. Please stop amlodipine. 90 tablet 1   HYDROcodone-acetaminophen (NORCO) 7.5-325 MG tablet Take 1-2 tablets by mouth every 6 (six) hours as needed for moderate pain. 60 tablet 0   No current facility-administered medications for this visit.    Allergies as of 05/30/2023 - Review Complete 05/30/2023  Allergen Reaction Noted   Statins  06/23/2021   Atorvastatin  10/20/2010   Rosuvastatin  10/20/2010   Zonisamide  05/12/2021    Vitals: BP (!) 140/70 (BP Location: Right Arm,  Patient Position: Sitting, Cuff Size: Small)   Pulse 67   Ht 5\' 7"  (1.702 m)   Wt 134 lb 6.4 oz (61 kg)   BMI 21.05 kg/m  Last Weight:  Wt Readings from Last 1 Encounters:  05/30/23 134 lb 6.4 oz (61 kg)   Last Height:   Ht Readings from Last 1 Encounters:  05/30/23 5\' 7"  (1.702 m)    Exam: NAD, pleasant  Eyes: No injection, mild lacrimation, corneal scarring, mild light sensitivity,                Speech:    Speech is normal; fluent and spontaneous with normal comprehension.  Cognition:    The patient is oriented to person, place, and time;      recent and remote memory intact;     language fluent;    Cranial Nerves:    The pupils are pinpoint, see scaring on the cornea, left 20/200, right 20/80, No injection, mild lacrimation, corneal scarring, mild light sensitivity,   .Trigeminal sensation is intact and the muscles of mastication are normal. The face is symmetric. The palate elevates in the midline. Hearing impaired. Voice is normal. Shoulder shrug is normal. The tongue has normal motion without fasciculations.   Coordination:  No dysmetria  Motor Observation:    No asymmetry, no atrophy, and no involuntary movements noted. Tone:    Normal muscle tone.     Strength:    Strength is V/V in the upper and lower limbs.      Sensation: intact to LT  Gait: uses a cane  Sensation: intact  DTRs: symmetrical  Toes: equiv  Assessment/Plan:   82 y.o. male here as requested by [name], [name], [name], B12 deficiency, HTN, PAC, DM2. 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 Ultimately, patients affected by this autoimmune disease will experience conjunctival cicatrization or scarring.He may need to be on Rituximab for life, has had a great response to rituximab, his vision is stable except as he gets closer to needigng his injection so still needs it q59months, vision is still poor but stable, doing great on injection unless getting close to his injection time which means he is still needing his injections every 4 months since his vision seems to start to wrosen if he does not  get it every 4 months, otherwise does fantastic.    Cluster headache: - Doing great on Emgality continue for cluster headaches go back to 2 monthly  Ocular cicatricial pemphigoid (OCP)  - Continue: Rituximab and continue to follow with [facility]. For ocular pemphigoid. 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 Ultimately, patients affected by this autoimmune disease will experience conjunctival cicatrization or scarring.He may need to be on Rituximab for life, Continue rituximab q29m.  - Blood test prior to infusion next one is 26th June, continue every 4 months  Chronic back pain - Opioid and robaxin for back pain  PRIOR ASSESSMENT AND PLANS: Better with verapamil and Emgality. Now on Verapamil 240mg  a day (cannot tolerate higher) for cluster headache dosage. Appears to be responding to cluster headache treatment excellent. Tremendous improvement on emgality from daily severe multiple episodes of cluster headaches to 2x a  month. Still having the headaches twice a month. It may last for 6 hours or 8 hours.  We discussed acute management when he does have headaches, discussed options and decided: At onset of headache try Ubrelvy with norco at onset of headache. Can repeat ubrelvy 2 hours later if needed, Can also try injectable Zembrace with the norco: Take one injection every 15 minutes if needed max 4 in one day gave samples  Nerve blocks, sphenopalatine blocks,Indomethacin, lidocaine nasal spray did not help. He had side effects to imitrex. He did not tolerate topamax but may have helped, neither tolerated zonegran. Botox did not help, nurtec did not help, he declined depakote, , high dose prednisone did not help significantly. has resolved ptosis from last botox injections. oxygen 15 L high flow for acute events did not help, will cancel order  MRI of the brain was negative as were carotid dopplers.  Ordered CT of the orbits which showed swelling underneath the right eyelid suggestive of orbital cellulitis,finished Keflex 500mg   bid start 7 days still swollen  Continue: Rituximab and continue to follow with [facility]. For ocular pemphigoid   PRIOR Assessment and plan:   I spoke to patient's [facility] physician, we are going to try and get infusion here in the office. He also continues on Prednisone 60mg . Temple pain/headache is 85% better but he still has pain supraorbitally however only at night or in the afternoon. But he is very improved. We discussed titrating the steroids and we will stay on 60mg  for one month then slowly decrease by 10mg  monthly. We discussed checking hgba1c and his glucose, I recommended he check his glucose daily, he is feeling well, no problems, his eyes feel much better and are not as red or painful. We received some notes from Dr. [name], but no orders, I have contacted him to get specifics on Rituximab including the pre-treatment, the injection doses and any labwork he wants prior to infusions. He is taking omeprazole daily, taking steroids with food as well, no GI upset.  Unclear etiology of headache but it has to be inflammatory/vascular or somehow related to the ocular pemphigoid, I doubt he would develop migraines or cluster headache at the age of 78 but trigeminal autonomic cephalalgias in the differential. Still we spoke about trying Depakote or other migraine medication(I do not think this is a migraine but it still may benefit from these medications). We decided to stay with the prednisone as it is helping after only 2 weeks and can consider other medications if needed (he is not enthusiastic about topamax or gabapentin, nurtec helped a little, wonder if qulipta would be helpful or depakote)  PRIOR Patient is on cellcept so I would think Temporal Arteritis(GCA) less likely however he has a new right temporal headache (he has no significant headache history) over the last 2 weeks with jaw symptoms and worsening vision. I am going to treat him for GCA and order esr/crp however not sure they  will be reliable. Will order Ultra Sound of the temporal arteries, if esr/crp elevated we can discuss a biopsy but may be elevated due to his current inflammatory eye disease. I have contacted his ocular immunologist Dr. [name].   (Addendum: ESR 31(ULN 30), CRP 13(ULN 10), temporal artery ultrasound without evidence of halo sign)  Earlyne Iba, MD ocular immunologist [phone]        Cc: [name], [name], MD,  Earlyne Iba, MD ocular immunologist [phone]  Naomie Dean, MD  [facility] [phone] [address]  [address] [place], [place] [address]  I spent over 45 minutes of face-to-face and non-face-to-face time with patient on the  1. Cicatricial ocular pemphigoid   2. Episodic cluster headache, not intractable   3. Chronic low back pain, unspecified back pain laterality, unspecified whether sciatica present      diagnosis.  This included previsit chart review, lab review, study review, order entry, electronic health record documentation, patient education on the different diagnostic and therapeutic options, counseling and coordination of care, risks and benefits of management, compliance, or risk factor reduction

## 2023-05-30 NOTE — Patient Instructions (Signed)
Continue current treatment Opioid and robaxin for back pain

## 2023-06-20 ENCOUNTER — Other Ambulatory Visit: Payer: Self-pay

## 2023-06-20 ENCOUNTER — Other Ambulatory Visit: Payer: Self-pay | Admitting: *Deleted

## 2023-06-20 DIAGNOSIS — Z79899 Other long term (current) drug therapy: Secondary | ICD-10-CM

## 2023-06-20 DIAGNOSIS — L121 Cicatricial pemphigoid: Secondary | ICD-10-CM | POA: Diagnosis not present

## 2023-06-21 LAB — COMPREHENSIVE METABOLIC PANEL
Alkaline Phosphatase: 75 IU/L (ref 44–121)
BUN: 21 mg/dL (ref 8–27)
Bilirubin Total: 0.4 mg/dL (ref 0.0–1.2)
Creatinine, Ser: 1.58 mg/dL — ABNORMAL HIGH (ref 0.76–1.27)
Total Protein: 6.7 g/dL (ref 6.0–8.5)

## 2023-06-21 LAB — T + B-LYMPHOCYTE DIFFERENTIAL
EOS (ABSOLUTE): 0.1 10*3/uL (ref 0.0–0.4)
MCH: 30.6 pg (ref 26.6–33.0)
Monocytes: 8 %
Neutrophils: 42 %
Platelets: 350 10*3/uL (ref 150–450)
RDW: 13.3 % (ref 11.6–15.4)
WBC: 6.7 10*3/uL (ref 3.4–10.8)

## 2023-06-22 LAB — IGG, IGA, IGM
IgA/Immunoglobulin A, Serum: 213 mg/dL (ref 61–437)
IgG (Immunoglobin G), Serum: 761 mg/dL (ref 603–1613)
IgM (Immunoglobulin M), Srm: 26 mg/dL (ref 15–143)

## 2023-06-22 LAB — T + B-LYMPHOCYTE DIFFERENTIAL
% CD 4 Pos. Lymph.: 26.9 % — ABNORMAL LOW (ref 30.8–58.5)
Absolute CD 3: 2765 /uL — ABNORMAL HIGH (ref 622–2402)
Absolute CD 4 Helper: 861 /uL (ref 359–1519)
Basophils Absolute: 0 10*3/uL (ref 0.0–0.2)
Basos: 1 %
CD19 % B Cell: 0 % — ABNORMAL LOW (ref 3.3–25.4)
CD4/CD8 Ratio: 0.45 — ABNORMAL LOW (ref 0.92–3.72)
CD8 T Cell Abs: 1914 /uL — ABNORMAL HIGH (ref 109–897)
Eos: 2 %
Hematocrit: 39.6 % (ref 37.5–51.0)
Hemoglobin: 13.7 g/dL (ref 13.0–17.7)
Immature Grans (Abs): 0 10*3/uL (ref 0.0–0.1)
Immature Granulocytes: 0 %
Lymphocytes Absolute: 3.2 10*3/uL — ABNORMAL HIGH (ref 0.7–3.1)
Lymphs: 47 %
MCHC: 34.6 g/dL (ref 31.5–35.7)
MCV: 89 fL (ref 79–97)
Monocytes Absolute: 0.5 10*3/uL (ref 0.1–0.9)
Neutrophils Absolute: 2.8 10*3/uL (ref 1.4–7.0)
RBC: 4.47 x10E6/uL (ref 4.14–5.80)

## 2023-06-22 LAB — COMPREHENSIVE METABOLIC PANEL
ALT: 14 IU/L (ref 0–44)
AST: 24 IU/L (ref 0–40)
Albumin: 4.4 g/dL (ref 3.7–4.7)
BUN/Creatinine Ratio: 13 (ref 10–24)
CO2: 22 mmol/L (ref 20–29)
Calcium: 9.8 mg/dL (ref 8.6–10.2)
Chloride: 101 mmol/L (ref 96–106)
Globulin, Total: 2.3 g/dL (ref 1.5–4.5)
Glucose: 144 mg/dL — ABNORMAL HIGH (ref 70–99)
Potassium: 4.6 mmol/L (ref 3.5–5.2)
Sodium: 139 mmol/L (ref 134–144)
eGFR: 44 mL/min/{1.73_m2} — ABNORMAL LOW (ref >59)

## 2023-07-02 DIAGNOSIS — H11233 Symblepharon, bilateral: Secondary | ICD-10-CM | POA: Diagnosis not present

## 2023-07-02 DIAGNOSIS — H5711 Ocular pain, right eye: Secondary | ICD-10-CM | POA: Diagnosis not present

## 2023-07-02 DIAGNOSIS — H02051 Trichiasis without entropian right upper eyelid: Secondary | ICD-10-CM | POA: Diagnosis not present

## 2023-07-02 DIAGNOSIS — Z947 Corneal transplant status: Secondary | ICD-10-CM | POA: Diagnosis not present

## 2023-07-04 DIAGNOSIS — L121 Cicatricial pemphigoid: Secondary | ICD-10-CM | POA: Diagnosis not present

## 2023-07-25 ENCOUNTER — Encounter (INDEPENDENT_AMBULATORY_CARE_PROVIDER_SITE_OTHER): Payer: Self-pay

## 2023-08-15 DIAGNOSIS — H11233 Symblepharon, bilateral: Secondary | ICD-10-CM | POA: Diagnosis not present

## 2023-08-15 DIAGNOSIS — H1089 Other conjunctivitis: Secondary | ICD-10-CM | POA: Diagnosis not present

## 2023-08-15 DIAGNOSIS — Z79899 Other long term (current) drug therapy: Secondary | ICD-10-CM | POA: Diagnosis not present

## 2023-08-20 ENCOUNTER — Ambulatory Visit (INDEPENDENT_AMBULATORY_CARE_PROVIDER_SITE_OTHER): Payer: Medicare Other | Admitting: Internal Medicine

## 2023-08-20 ENCOUNTER — Encounter: Payer: Self-pay | Admitting: Internal Medicine

## 2023-08-20 VITALS — BP 112/68 | HR 78 | Temp 97.9°F | Ht 67.0 in | Wt 129.0 lb

## 2023-08-20 DIAGNOSIS — E119 Type 2 diabetes mellitus without complications: Secondary | ICD-10-CM | POA: Diagnosis not present

## 2023-08-20 DIAGNOSIS — N183 Chronic kidney disease, stage 3 unspecified: Secondary | ICD-10-CM

## 2023-08-20 DIAGNOSIS — E785 Hyperlipidemia, unspecified: Secondary | ICD-10-CM | POA: Diagnosis not present

## 2023-08-20 DIAGNOSIS — E559 Vitamin D deficiency, unspecified: Secondary | ICD-10-CM | POA: Diagnosis not present

## 2023-08-20 DIAGNOSIS — M545 Low back pain, unspecified: Secondary | ICD-10-CM

## 2023-08-20 DIAGNOSIS — Z23 Encounter for immunization: Secondary | ICD-10-CM

## 2023-08-20 DIAGNOSIS — E538 Deficiency of other specified B group vitamins: Secondary | ICD-10-CM | POA: Diagnosis not present

## 2023-08-20 LAB — COMPREHENSIVE METABOLIC PANEL
ALT: 17 U/L (ref 0–53)
AST: 23 U/L (ref 0–37)
Albumin: 4.2 g/dL (ref 3.5–5.2)
Alkaline Phosphatase: 83 U/L (ref 39–117)
BUN: 19 mg/dL (ref 6–23)
CO2: 28 mEq/L (ref 19–32)
Calcium: 9.7 mg/dL (ref 8.4–10.5)
Chloride: 103 mEq/L (ref 96–112)
Creatinine, Ser: 1.55 mg/dL — ABNORMAL HIGH (ref 0.40–1.50)
GFR: 41.75 mL/min — ABNORMAL LOW (ref >60.00)
Glucose, Bld: 100 mg/dL — ABNORMAL HIGH (ref 70–99)
Potassium: 4.3 mEq/L (ref 3.5–5.1)
Sodium: 138 mEq/L (ref 135–145)
Total Bilirubin: 0.5 mg/dL (ref 0.2–1.2)
Total Protein: 7.3 g/dL (ref 6.0–8.3)

## 2023-08-20 LAB — CBC WITH DIFFERENTIAL/PLATELET
Basophils Absolute: 0.1 10*3/uL (ref 0.0–0.1)
Basophils Relative: 0.7 % (ref 0.0–3.0)
Eosinophils Absolute: 0.3 10*3/uL (ref 0.0–0.7)
Eosinophils Relative: 3.6 % (ref 0.0–5.0)
HCT: 42.2 % (ref 39.0–52.0)
Hemoglobin: 14 g/dL (ref 13.0–17.0)
Lymphocytes Relative: 42.3 % (ref 12.0–46.0)
Lymphs Abs: 3.7 10*3/uL (ref 0.7–4.0)
MCHC: 33.2 g/dL (ref 30.0–36.0)
MCV: 90.6 fl (ref 78.0–100.0)
Monocytes Absolute: 0.8 10*3/uL (ref 0.1–1.0)
Monocytes Relative: 8.8 % (ref 3.0–12.0)
Neutro Abs: 3.9 10*3/uL (ref 1.4–7.7)
Neutrophils Relative %: 44.6 % (ref 43.0–77.0)
Platelets: 372 10*3/uL (ref 150.0–400.0)
RBC: 4.66 Mil/uL (ref 4.22–5.81)
RDW: 13.4 % (ref 11.5–15.5)
WBC: 8.8 10*3/uL (ref 4.0–10.5)

## 2023-08-20 LAB — HEMOGLOBIN A1C: Hgb A1c MFr Bld: 6 % (ref 4.6–6.5)

## 2023-08-20 MED ORDER — CYANOCOBALAMIN 1000 MCG/ML IJ SOLN: 1000.0000 ug | INTRAMUSCULAR | 3 refills | Status: AC

## 2023-08-20 NOTE — Assessment & Plan Note (Signed)
Resolved

## 2023-08-20 NOTE — Assessment & Plan Note (Signed)
Chronic Cont w/B12 injections

## 2023-08-20 NOTE — Assessment & Plan Note (Signed)
Check A1c. 

## 2023-08-20 NOTE — Assessment & Plan Note (Signed)
On Tricor 

## 2023-08-20 NOTE — Assessment & Plan Note (Signed)
Monitoring GFR Hydrate well

## 2023-08-20 NOTE — Progress Notes (Signed)
Subjective:  Patient ID: Louis Kemp, male    DOB: 1941/03/16  Age: 82 y.o. MRN: [id number]  CC: No chief complaint on file.   HPI Louis Kemp presents for vision loss, cluster HAs, HTN, dyslipidemia, DM  Outpatient Medications Prior to Visit  Medication Sig Dispense Refill   Cholecalciferol (VITAMIN D) 2000 UNITS CAPS Take 1 capsule by mouth every other day.     diphenoxylate-atropine (LOMOTIL) 2.5-0.025 MG tablet Take 1-2 tablets by mouth 4 (four) times daily as needed for diarrhea or loose stools. 60 tablet 0   escitalopram (LEXAPRO) 5 MG tablet Take 1 tablet (5 mg total) by mouth at bedtime. 30 tablet 5   fenofibrate (TRICOR) 145 MG tablet Take 145 mg by mouth daily.     Galcanezumab-gnlm (EMGALITY, 300 MG DOSE,) 100 MG/ML SOSY Inject 300 mg (3 pens) into the skin every 30 days during cluster headache cycle and stop when cycle is over. 3 mL 11   HYDROcodone-acetaminophen (NORCO) 7.5-325 MG tablet Take 1-2 tablets by mouth every 6 (six) hours as needed for moderate pain. 60 tablet 0   Lotilaner 0.25 % SOLN Apply to eye.     Melatonin 10 MG CAPS Take 10 capsules by mouth at bedtime.     metFORMIN (GLUCOPHAGE) 500 MG tablet TAKE ONE TABLET BY MOUTH THREE TIMES DAILY 270 tablet 1   methocarbamol (ROBAXIN) 500 MG tablet Take 1 tablet (500 mg total) by mouth 4 (four) times daily as needed for muscle spasms. 120 tablet 11   PROLENSA 0.07 % SOLN Apply to eye.     riTUXimab-abbs (TRUXIMA) 100 MG/10ML injection Inject into the vein. Injection 3 times a year     tamsulosin (FLOMAX) 0.4 MG CAPS capsule TAKE ONE CAPSULE BY MOUTH DAILY 90 capsule 1   traZODone (DESYREL) 50 MG tablet Take 0.5-1 tablets (25-50 mg total) by mouth at bedtime. 90 tablet 3   typhoid (VIVOTIF) DR capsule Take 1 capsule by mouth every other day. 4 capsule 0   verapamil (CALAN-SR) 240 MG CR tablet Take 1 tablet (240 mg total) by mouth at bedtime. Please stop amlodipine. 90 tablet 1   atovaquone-proguanil (MALARONE)  250-100 MG TABS tablet Start 1 tablet a day one day prior to your trip.  Take 1 tablet daily while traveling.  Continue 1 tablet daily for 1 week after you returned, then stop. 40 tablet 1   azithromycin (ZITHROMAX Z-PAK) 250 MG tablet As directed 6 tablet 0   cyanocobalamin (VITAMIN B12) 1000 MCG/ML injection INJECT 1 ML INTO THE MUSCLE EVERY 14 DAYS 10 mL 3   No facility-administered medications prior to visit.    ROS: Review of Systems  Constitutional:  Negative for appetite change, fatigue and unexpected weight change.  HENT:  Negative for congestion, nosebleeds, sneezing, sore throat and trouble swallowing.   Eyes:  Positive for visual disturbance. Negative for pain, discharge and itching.  Respiratory:  Negative for cough.   Cardiovascular:  Negative for chest pain, palpitations and leg swelling.  Gastrointestinal:  Negative for abdominal distention, blood in stool, diarrhea and nausea.  Genitourinary:  Negative for frequency and hematuria.  Musculoskeletal:  Negative for back pain, gait problem, joint swelling and neck pain.  Skin:  Negative for rash.  Neurological:  Negative for dizziness, tremors, speech difficulty, weakness and light-headedness.  Psychiatric/Behavioral:  Negative for agitation, dysphoric mood and sleep disturbance. The patient is not nervous/anxious.     Objective:  BP 112/68 (BP Location: Left Arm, Patient Position: Sitting,  Cuff Size: Small)   Pulse 78   Temp 97.9 F (36.6 C) (Oral)   Ht 5\' 7"  (1.702 m)   Wt 129 lb (58.5 kg)   SpO2 95%   BMI 20.20 kg/m   BP Readings from Last 3 Encounters:  08/20/23 112/68  05/30/23 (!) 140/70  04/05/23 118/62    Wt Readings from Last 3 Encounters:  08/20/23 129 lb (58.5 kg)  05/30/23 134 lb 6.4 oz (61 kg)  04/05/23 134 lb (60.8 kg)    Physical Exam Constitutional:      General: He is not in acute distress.    Appearance: Normal appearance. He is well-developed.     Comments: NAD  Eyes:      Conjunctiva/sclera: Conjunctivae normal.     Pupils: Pupils are equal, round, and reactive to light.  Neck:     Thyroid: No thyromegaly.     Vascular: No JVD.  Cardiovascular:     Rate and Rhythm: Normal rate and regular rhythm.     Heart sounds: Normal heart sounds. No murmur heard.    No friction rub. No gallop.  Pulmonary:     Effort: Pulmonary effort is normal. No respiratory distress.     Breath sounds: Normal breath sounds. No wheezing or rales.  Chest:     Chest wall: No tenderness.  Abdominal:     General: Bowel sounds are normal. There is no distension.     Palpations: Abdomen is soft. There is no mass.     Tenderness: There is no abdominal tenderness. There is no guarding or rebound.  Musculoskeletal:        General: No tenderness. Normal range of motion.     Cervical back: Normal range of motion.  Lymphadenopathy:     Cervical: No cervical adenopathy.  Skin:    General: Skin is warm and dry.     Findings: No rash.  Neurological:     Mental Status: He is alert and oriented to person, place, and time.     Cranial Nerves: No cranial nerve deficit.     Motor: No abnormal muscle tone.     Coordination: Coordination normal.     Gait: Gait normal.     Deep Tendon Reflexes: Reflexes are normal and symmetric.  Psychiatric:        Behavior: Behavior normal.        Thought Content: Thought content normal.        Judgment: Judgment normal.     Lab Results  Component Value Date   WBC 6.7 06/20/2023   HGB 13.7 06/20/2023   HCT 39.6 06/20/2023   PLT 350 06/20/2023   GLUCOSE 144 (H) 06/20/2023   CHOL 161 05/31/2022   TRIG 108.0 05/31/2022   HDL 54.80 05/31/2022   LDLCALC 85 05/31/2022   ALT 14 06/20/2023   AST 24 06/20/2023   NA 139 06/20/2023   K 4.6 06/20/2023   CL 101 06/20/2023   CREATININE 1.58 (H) 06/20/2023   BUN 21 06/20/2023   CO2 22 06/20/2023   TSH 1.31 04/05/2023   PSA 0.78 01/04/2023   INR 1.01 09/03/2014   HGBA1C 6.3 04/05/2023   MICROALBUR 1.2  04/05/2023    MR BRAIN/IAC W WO CONTRAST  Result Date: 07/31/2022 CLINICAL DATA:  Provided history: Hearing loss. Additional history provided by scanning technologist: Patient reports left greater than right hearing loss for 6 months. Blurred vision. EXAM: MRI HEAD WITHOUT AND WITH CONTRAST TECHNIQUE: Multiplanar, multiecho pulse sequences of the brain and surrounding  structures were obtained without and with intravenous contrast. CONTRAST:  12mL MULTIHANCE GADOBENATE DIMEGLUMINE 529 MG/ML IV SOLN COMPARISON:  CT of the orbits 06/20/2021. Brain MRI 01/05/2021. FINDINGS: Intermittently motion degraded examination. Most notably, there is mild-to-moderate motion degradation of the axial high-resolution heavily T2 weighted sequence through the internal auditory canals and moderate motion degradation of the whole brain axial T1 weighted post-contrast sequence. Brain: Mild to moderate generalized parenchymal atrophy. Multifocal T2 FLAIR hyperintense signal abnormality within the cerebral white matter, nonspecific but compatible with mild chronic small vessel ischemic disease. No evidence of an intracranial mass. Specifically, no cerebellopontine angle or internal auditory canal mass is demonstrated. Unremarkable appearance of the 7th and 8th cranial nerves bilaterally. There is no acute infarct. No chronic intracranial blood products. No extra-axial fluid collection. No midline shift. No pathologic intracranial enhancement identified. Vascular: Maintained flow voids within the proximal large arterial vessels. Skull and upper cervical spine: No focal suspicious marrow lesion. Incompletely assessed cervical spondylosis. Sinuses/Orbits: No mass or acute finding within the imaged orbits. Prior bilateral ocular lens replacement. Small mucous retention cysts within the bilateral maxillary sinuses. Trace mucosal thickening within the bilateral ethmoid sinuses. IMPRESSION: Intermittently motion degraded examination. No  evidence of acute intracranial abnormality. No cerebellopontine angle or internal auditory canal mass. Mild chronic small vessel ischemic changes within the cerebral white matter, similar to the prior brain MRI of 01/05/2021. Mild-to-moderate generalized parenchymal atrophy. Mild paranasal sinus disease, as described. Electronically Signed   By: Jackey Loge D.O.   On: 07/31/2022 08:01    Assessment & Plan:   Problem List Items Addressed This Visit     B12 deficiency    Chronic Cont w/B12 injections      Relevant Orders   CBC with Differential/Platelet   Comprehensive metabolic panel   Hemoglobin A1c   Chronic renal insufficiency, stage 3 (moderate) (HCC)    Monitoring GFR Hydrate well      Diabetes type 2, controlled (HCC) - Primary    Check A1c      Relevant Orders   CBC with Differential/Platelet   Comprehensive metabolic panel   Hemoglobin A1c   Dyslipidemia    On Tricor      Relevant Orders   CBC with Differential/Platelet   Comprehensive metabolic panel   Hemoglobin A1c   LOW BACK PAIN    Resolved       Vitamin D deficiency    On Vit D      Other Visit Diagnoses     Need for influenza vaccination       Relevant Orders   Flu Vaccine Trivalent High Dose (Fluad) (Completed)         Meds ordered this encounter  Medications   cyanocobalamin (VITAMIN B12) 1000 MCG/ML injection    Sig: Inject 1 mL (1,000 mcg total) into the skin every 14 (fourteen) days.    Dispense:  10 mL    Refill:  3      Follow-up: No follow-ups on file.  Sonda Primes, MD

## 2023-08-20 NOTE — Assessment & Plan Note (Signed)
On Vit D 

## 2023-09-10 ENCOUNTER — Other Ambulatory Visit: Payer: Self-pay | Admitting: Internal Medicine

## 2023-09-10 NOTE — Telephone Encounter (Signed)
Pt mentioned that he needs this medication refilled. If there is an issue please feel free to call him.

## 2023-09-10 NOTE — Telephone Encounter (Signed)
Pt need his O

## 2023-10-01 DIAGNOSIS — Z947 Corneal transplant status: Secondary | ICD-10-CM | POA: Diagnosis not present

## 2023-10-01 DIAGNOSIS — H02051 Trichiasis without entropian right upper eyelid: Secondary | ICD-10-CM | POA: Diagnosis not present

## 2023-10-01 DIAGNOSIS — H02054 Trichiasis without entropian left upper eyelid: Secondary | ICD-10-CM | POA: Diagnosis not present

## 2023-10-01 DIAGNOSIS — H02115 Cicatricial ectropion of left lower eyelid: Secondary | ICD-10-CM | POA: Diagnosis not present

## 2023-10-24 ENCOUNTER — Other Ambulatory Visit: Payer: Self-pay

## 2023-10-24 ENCOUNTER — Other Ambulatory Visit: Payer: Self-pay | Admitting: Internal Medicine

## 2023-10-24 ENCOUNTER — Other Ambulatory Visit: Payer: Self-pay | Admitting: *Deleted

## 2023-10-24 DIAGNOSIS — G44019 Episodic cluster headache, not intractable: Secondary | ICD-10-CM

## 2023-10-24 DIAGNOSIS — L121 Cicatricial pemphigoid: Secondary | ICD-10-CM | POA: Diagnosis not present

## 2023-10-24 DIAGNOSIS — Z79899 Other long term (current) drug therapy: Secondary | ICD-10-CM | POA: Diagnosis not present

## 2023-10-24 DIAGNOSIS — M316 Other giant cell arteritis: Secondary | ICD-10-CM | POA: Diagnosis not present

## 2023-10-25 LAB — COMPREHENSIVE METABOLIC PANEL
ALT: 16 [IU]/L (ref 0–44)
AST: 35 [IU]/L (ref 0–40)
Albumin: 4.4 g/dL (ref 3.7–4.7)
Alkaline Phosphatase: 77 [IU]/L (ref 44–121)
BUN/Creatinine Ratio: 12 (ref 10–24)
BUN: 19 mg/dL (ref 8–27)
Bilirubin Total: 0.3 mg/dL (ref 0.0–1.2)
CO2: 17 mmol/L — ABNORMAL LOW (ref 20–29)
Calcium: 9.9 mg/dL (ref 8.6–10.2)
Chloride: 103 mmol/L (ref 96–106)
Creatinine, Ser: 1.54 mg/dL — ABNORMAL HIGH (ref 0.76–1.27)
Globulin, Total: 2.6 g/dL (ref 1.5–4.5)
Glucose: 130 mg/dL — ABNORMAL HIGH (ref 70–99)
Potassium: 5.2 mmol/L (ref 3.5–5.2)
Sodium: 140 mmol/L (ref 134–144)
Total Protein: 7 g/dL (ref 6.0–8.5)
eGFR: 45 mL/min/{1.73_m2} — ABNORMAL LOW (ref >59)

## 2023-10-25 LAB — T + B-LYMPHOCYTE DIFFERENTIAL
% CD 3 Pos. Lymph.: 86.8 % — ABNORMAL HIGH (ref 57.5–86.2)
% CD 4 Pos. Lymph.: 32.4 % (ref 30.8–58.5)
Absolute CD 3: 2517 /uL — ABNORMAL HIGH (ref 622–2402)
Absolute CD 4 Helper: 940 /uL (ref 359–1519)
Basophils Absolute: 0 10*3/uL (ref 0.0–0.2)
Basos: 1 %
CD19 % B Cell: 0.1 % — ABNORMAL LOW (ref 3.3–25.4)
CD19 Abs: 3 /uL — ABNORMAL LOW (ref 12–645)
CD4/CD8 Ratio: 0.59 — ABNORMAL LOW (ref 0.92–3.72)
CD8 % Suppressor T Cell: 55 % — ABNORMAL HIGH (ref 12.0–35.5)
CD8 T Cell Abs: 1595 /uL — ABNORMAL HIGH (ref 109–897)
EOS (ABSOLUTE): 0.2 10*3/uL (ref 0.0–0.4)
Eos: 3 %
Hematocrit: 43.3 % (ref 37.5–51.0)
Hemoglobin: 14.1 g/dL (ref 13.0–17.7)
Immature Grans (Abs): 0 10*3/uL (ref 0.0–0.1)
Immature Granulocytes: 0 %
Lymphocytes Absolute: 2.9 10*3/uL (ref 0.7–3.1)
Lymphs: 39 %
MCH: 29.7 pg (ref 26.6–33.0)
MCHC: 32.6 g/dL (ref 31.5–35.7)
MCV: 91 fL (ref 79–97)
Monocytes Absolute: 0.6 10*3/uL (ref 0.1–0.9)
Monocytes: 9 %
Neutrophils Absolute: 3.5 10*3/uL (ref 1.4–7.0)
Neutrophils: 48 %
Platelets: 394 10*3/uL (ref 150–450)
RBC: 4.75 x10E6/uL (ref 4.14–5.80)
RDW: 13.4 % (ref 11.6–15.4)
WBC: 7.3 10*3/uL (ref 3.4–10.8)

## 2023-10-26 ENCOUNTER — Other Ambulatory Visit: Payer: Self-pay | Admitting: Internal Medicine

## 2023-10-29 ENCOUNTER — Telehealth: Payer: Self-pay | Admitting: Internal Medicine

## 2023-10-29 NOTE — Telephone Encounter (Signed)
Patient requested a refill of verapamil (CALAN-SR) 240 MG CR tablet on Friday though his pharmacy. He said he is completely out of medication. He wanted to know why it was not refilled that same day. He was informed of the standard wait time for medication refills and that Dr. [name] is not in the office on Fridays. Patient was extremely upset and was demanding to speak with the nurse. He demanded to receive a call back within 30 minutes and was told that could not be guaranteed. He then started to yell and disconnected. Best callback is [phone].

## 2023-10-30 MED ORDER — VERAPAMIL HCL ER 240 MG PO TBCR: 240.0000 mg | EXTENDED_RELEASE_TABLET | Freq: Every day | ORAL | 3 refills | Status: DC

## 2023-10-30 NOTE — Telephone Encounter (Unsigned)
Is done.  Thanks.

## 2023-11-08 DIAGNOSIS — L121 Cicatricial pemphigoid: Secondary | ICD-10-CM | POA: Diagnosis not present

## 2023-11-08 DIAGNOSIS — M316 Other giant cell arteritis: Secondary | ICD-10-CM | POA: Diagnosis not present

## 2023-11-29 ENCOUNTER — Ambulatory Visit: Payer: Medicare Other | Admitting: Neurology

## 2023-12-05 ENCOUNTER — Encounter: Payer: Self-pay | Admitting: Neurology

## 2023-12-05 ENCOUNTER — Ambulatory Visit: Payer: Medicare Other | Admitting: Neurology

## 2023-12-05 VITALS — BP 136/86 | HR 72 | Ht 65.0 in | Wt 133.0 lb

## 2023-12-05 DIAGNOSIS — G44019 Episodic cluster headache, not intractable: Secondary | ICD-10-CM | POA: Diagnosis not present

## 2023-12-05 DIAGNOSIS — L121 Cicatricial pemphigoid: Secondary | ICD-10-CM | POA: Diagnosis not present

## 2023-12-05 NOTE — Progress Notes (Signed)
[facility]    Provider:  [name] Requesting Provider: [name], [name], Kemp Primary Care Provider:  [name], Kemp  CC:  right cluster headache and on rituximab for Ocular cicatricial pemphigoid,    12/05/2023: 82 y.o. male here as requested by Louis Kemp . PMHx ocular pemphigoid, B12 deficiency, HTN, PAC, DM2. 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 Ultimately, patients affected by this autoimmune disease will experience conjunctival cicatrization or scarring.He may need to be on Rituximab for life, has had a great response to rituximab, his vision is stable except as he gets closer to needigng his injection so still needs it q107months, vision is still poor but stable, doing great on injection unless getting close to his injection time which means he is still needing his injections every 4 months since his vision seems to start to wrosen if he does not  get it every 4 months, otherwise does fantastic.    he is doing well on the rituximab no side effects his vision is better but worsens near the end of the infusion cycle which means he needs to continue for one more year likely, the pain is better, lacrimation is better, we will continue Rituximab, it has improved his symptoms by >70% and has improved his quality of life by > 70%. We will continue to next 2 infusions and if still doing well an dnot wearing off by then, we will continue with 2 more infusions and then stop to see if he remains significantly improved.  He is being treated by [facility] and his Dr. Carlynn Kemp ophthalmologist left and being treated by Dr. Jackie Kemp.  Continue emgality for cluster headaches.   Cluster headache: - Doing great on Emgality continue for cluster headaches go back to 2 monthly Patient complains of symptoms per HPI as well as the following  symptoms: none . Pertinent negatives and positives per HPI. All others negative   05/30/2023: Cluster headaches came back, increased to 2 emgality and working.    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 Ultimately, patients affected by this autoimmune disease will experience conjunctival cicatrization or scarring.He may need to be on Rituximab for life, has had a great response to rituximab, his vision is stable except as he gets closer to needigng his injection, vision is still poor but stable, mild lacrimation, stable vision unless getting close to his injection which means he is still needing his injections every 4 months since he seems to start to wrosen blurry vision if he does not, otherwise does fantastic on rituximab life changing.  Per ophthalmologist at [facility] he is to stay on rituximab as they feel stopping now would worsen ocular pemphigoid, not on restasis, he has Cicatrizing conjunctivitis,  Ocular cicatricial pemphigoid, he is to continue the same therapy, he has some irritation or dryness but he uses drops every day prednisone drops and tear drops, he does feel worse right before getting the infusion he start s more blurred vision right before the rituximab infusion. He has significant corneal scarring limiting vision from his condition which will worsen if the infusion is discontinued.  Continue rituximab q56m. His hearing was impaired and CT scan did not show etiology/ENT and he has hearing aids. Left eye 20/200, right eye is 20/80. He is very positive. He wears a contact in the right eye.  He exercises on stationary bike. He feels a little fatigue but still exercises. Will forward all labs to Dr. Jackie Kemp every 6 months.   He has chronic back pain and is going to [place] for 3 weeks can refill his opioid and robaxin as needed he has taken them before  Reviewed notes, labs and imaging  from outside physicians, which showed:  07/2022: EXAM: MRI HEAD WITHOUT AND WITH CONTRAST   TECHNIQUE: Multiplanar, multiecho pulse sequences of the brain and surrounding structures were obtained without and with intravenous contrast.   CONTRAST:  12mL MULTIHANCE GADOBENATE DIMEGLUMINE 529 MG/ML IV SOLN   COMPARISON:  CT of the orbits 06/20/2021. Brain MRI 01/05/2021.   FINDINGS: Intermittently motion degraded examination. Most notably, there is mild-to-moderate motion degradation of the axial high-resolution heavily T2 weighted sequence through the internal auditory canals and moderate motion degradation of the whole brain axial T1 weighted post-contrast sequence.   Brain:   Mild to moderate generalized parenchymal atrophy.   Multifocal T2 FLAIR hyperintense signal abnormality within the cerebral white matter, nonspecific but compatible with mild chronic small vessel ischemic disease.   No evidence of an intracranial mass. Specifically, no cerebellopontine angle or internal auditory canal mass is demonstrated. Unremarkable appearance of the 7th and 8th cranial nerves bilaterally.   There is no acute infarct.   No chronic intracranial blood products.   No extra-axial fluid collection.   No midline shift.   No pathologic intracranial enhancement identified.   Vascular: Maintained flow voids within the proximal large arterial vessels.   Skull and upper cervical spine: No focal suspicious marrow lesion. Incompletely assessed cervical spondylosis.   Sinuses/Orbits: No mass or acute finding within the imaged orbits. Prior bilateral ocular lens replacement. Small mucous retention cysts within the bilateral maxillary sinuses. Trace mucosal thickening within the bilateral ethmoid sinuses.   IMPRESSION: Intermittently motion degraded examination.   No evidence of acute intracranial abnormality.   No cerebellopontine angle or internal auditory canal mass.   Mild  chronic small vessel ischemic changes within the cerebral white matter, similar to the prior brain MRI of 01/05/2021.   Mild-to-moderate generalized parenchymal atrophy.   Mild paranasal sinus disease, as described.    Dr. [name] ophthalmology's last note:   Current meds:  -Rituximab q21m -PF 2/2 -HyloTear -Daily soft CTL  Previous:  -Restasis (allergic reaction) -Sclerals (did not tolerate, got RCE)  Assessment: Cicatrizing conjunctivitis, clinical suspicion for Ocular cicatricial pemphigoid -Quiet OU, good response to Rtx. Continue same therapy. -symptoms of irritation and dryness after YAG capsulotomy in 09/2019 Dr. [name] -Conj biopsy 2/2: No diagnostic deposits were seen, only a strong linear band of fibrin at the basement membrane -Additionally, Quantiferon TB Positive (history of BCG vaccination)  Today 03/28/2023 S/p SK and BrightStar placement (09/07/2022, [name]) Stable without epi defect Significant corneal scarring limiting vision Upper lid trichiasis today  Symblepharon of both eyes - 09/05/21: s/p right lower eyelid ectropion and right upper and lower punctoplasty with excision of caruncle and AMT with sutures, with right upper eyelid entropion repair, and left lower eyelid punctoplasty.  Plan: - pulled multiple lashes out of RUL  - Continue PF 2/2 - Continue Losartan 4/4  - Continue rituximab q62m (administered by Dr. [name]) - discussed PRGF/serum tears  Finish losartan then rx PRGF  Follow up in 4 months   Patient complains of symptoms per HPI as well as the following symptoms: back pain . Pertinent negatives and positives per HPI. All others negative  11/2022: He is significantly improved.  No headaches, vision is better. He wakes up every 2 hours. Then it takes him time to sleep. He wakes really. Not taking naps. He goes to bed 930-10am. No snoring. Also difficulty initiating sleep.   He is improved, he had eye surgery on the cornea and he is  feeling better with his vision. Light doesn't bother him anymore. They saw Dr. Carlynn Kemp yesterday. He has not had a headache in 3 months. He is having hearing problems, his left ear is worsening, I am not awareof any ototoxicity with rituximab will ave him discuss with Dr. Carlynn Kemp at [facility]. His wife is here and provides information. He tried to go down to 2 emgality and had a cluster headache, went back to 3.  Patient complains of symptoms per HPI as well as the following symptoms: left ear decreased hearing . Pertinent negatives and positives per HPI. All others negative   02/09/2022: Tremendous improvement on emgality from daily severe multiple episodes of cluster headaches to 2x a month. Still having the headaches twice a month. It may last for 6 hours or 8 hours. We discussed options, continue taking Emgality, his vision is better slightly, he continues on Rituximab in out office and we monitor his labs and send them to Dr. Carlynn Kemp ophthalmology at [facility]. We discussed acute management when he does have headaches, discussed options and decided: At onset of headache try Ubrelvy with norco at onset of headache. Can repeat ubrelvy 2 hours later if needed Can also try injectable Zembrace with the norco: Take one injection every 15 minutes if needed max 4 in one day  10/10/2021: He is doing great, just a few headaches, doing well on verapamil and emgality, he had some corneal scarring and he had aprocedure which is helping. Follow up phone visit in 3 months  Interval history: Patient her etoday, he is feeling much better. We have him on verapamil and have given him Emaglity cluster headache dose (today will be the second dose). Lidocaine spray, oxygen, nerve block, sphenopalatine blocks did not last.   Interval history: Patient here for intractable episodic cluster headaches in the setting of very rare ocular pemphigoid.  He has intractable periorbital pain in the right with unilateral lacrimation and rhinorrhea.   Initially thought this might be a secondary headache from the very rare ocular pemphigoid, but at this time it appears he has concomitant cluster headaches; the rarity of ocular pemphigoid in addition with cluster headaches is quite unusual.  Today he has ptosis, likely from botox. Botox x 2 has not helped.  We have tried oral triptans, Nurtec, indomethacin, and none have relieved his symptoms.  Indomethacin was titrated up to 150 mg a day does not appear to be an indomethacin responsive headache but can try going up to 225 mg a day.  We have tried him on Topamax, Zonegran, high-dose steroids for over a month.  We have discontinued his amlodipine and carvedilol and we started verapamil.  Today we also injected him with Emgality.  I performed nerve blocks as well as sphenopalatine ganglion block.  Our plan is to do the following:  Interval history: The patient describes a headache primarily as a constant boring pain.  Certainly, the possibility of hemicrania continua does need to be considered or Cluster headache.  A trial indomethacin(or verapamil or Emgality) may be of some diagnostic benefit. He did not tolerate topamax but may have helped, neither tolerated zonegran, we have tried botox and will repeat, he declines depakote. Will titrate him off  of prenisone and try Indomethacin. If no benefit will try Emgality or Verapamil.   Physially he is better, more energy, still having some redness of the right eye but maybe slightly improved, vision has not improved, Stopped the cellcept and on the rituximab.   Interval history: The pain is on the right temple, it happens in the afternoon or overnight about the same time every day, right eye waters(both eyes water), other wise headache wakes him up if he does not take medication, his blood pressure has been a little low he was feeling weak and it was 108 systolic. Taking the amlodipine at 6pm, taking the carvedilol 6-7a, 10pm. BP 108/77 and tachycardic. He took  gatorade yesterday and he was better but this morning again his BP was a little low. We decided to do nerve blocks today. We discussed topamax, CGRP meds, unfrotunately difficult to assess and treat unclear etiology of headaches - associated with his pemphigoid eye disease or is this an independent headache syndrome with associated autonomic symptoms? If no improvement with rituximab this Thursday we will change course.   Interval history 01/24/2021: I spoke to patient's [facility] physician, we are going to try and get infusion here in the office. He also continues on Prednisone. Temple pain/headache is 85% better but he still has pain supraorbitally. Only at night or in the afternoon. But he is very improved. We discussed titrating the steroids and we will stay on 60mg  for one month then slowly decrease by 10mg  monthly. We discussed checking hgba1c and his glucose, I recommended he check his glucose daily, he is feeling well, no problems, his eyes feel much better and are not as red or painful. We received some notes from Dr. Carlynn Kemp, but no orders, I have contacted him to get specifics on the pre-treatment, the injection doses and any labwork he wants prior to infusions. He is taking omeprazole daily, taking steroids with food as well, no GI upset.   HPI:  Louis Kemp is a 82 y.o. male here as requested by Louis Kemp for headaches. PMHx ocular pemphigoid, B12 deficiency, HTN, PAC, DM2. New onset right temporal headache, continuous, sever at times.  Headaches started 2 weeks ago, it wasn't that bad but it is worsening and now can be severe, it is in the temple and radiates to the temple and frontal area. It becomes quite severe 7-8/10. Sharp pain, shooting pain, he had prior headache in the left eye which was not similar it was ocular pain his eye was so inflamed due to the Pemphigoid. This is not the same, this is new, and is not anything he has ever experienced. He has noticed in the last 4-5 days  when he eats he has jaw pain. The right eye vision is worsening. No significant history of migraines. No other significant history of headaches, he had one migraine in the remote past and this is nothing like it. The pain is continuous, no known triggers. Alleve is not working. Had a long discussion with patient regarding possiibilites and differential. No other focal neurologic deficits, associated symptoms, inciting events or modifiable factors.  Reviewed notes, labs and imaging from outside physicians, which showed:  Personally reviewed imaging with Dr. Janalyn Shy Scafidi(Neuroradiology) and agree with the following:mild T2 hyperintensities, mild cerebral atrophy, otherwise unremarkable for age.   11/10/2020: TSH nml, B12 1056, cbc unremarkable, CMP elevated glucose and reduced GFR 52 otherwise normal Review of Systems: Patient complains of symptoms per HPI as well as the following  symptoms: headache, vision changes . Pertinent negatives and positives per HPI. All others negative    Social History   Socioeconomic History   Marital status: Married    Spouse name: [name]   Number of children: Not on file   Years of education: Not on file   Highest education level: Not on file  Occupational History   Occupation: K[employer] Retired  Tobacco Use   Smoking status: Former    Types: Cigars   Smokeless tobacco: Never   Tobacco comments:    3-4 cigars per year in the past but none in the past 10 years  Vaping Use   Vaping status: Never Used  Substance and Sexual Activity   Alcohol use: Not Currently    Alcohol/week: 0.0 - 1.0 standard drinks of alcohol    Comment: maybe 1 mixed drink maybe once a month   Drug use: No   Sexual activity: Not Currently  Other Topics Concern   Not on file  Social History Narrative   Regular Exercise -  YES, golf   Lives at home with wife    Right handed   Caffeine:  1-2 cups/day   Social Determinants of Health   Financial Resource Strain: Low Risk  (02/15/2023)    Overall Financial Resource Strain (CARDIA)    Difficulty of Paying Living Expenses: Not hard at all  Food Insecurity: No Food Insecurity (02/15/2023)   Hunger Vital Sign    Worried About Running Out of Food in the Last Year: Never true    Ran Out of Food in the Last Year: Never true  Transportation Needs: No Transportation Needs (02/15/2023)   PRAPARE - Administrator, Civil Service (Medical): No    Lack of Transportation (Non-Medical): No  Physical Activity: Sufficiently Active (02/15/2023)   Exercise Vital Sign    Days of Exercise per Week: 5 days    Minutes of Exercise per Session: 60 min  Stress: No Stress Concern Present (02/15/2023)   [employer] of Occupational Health - Occupational Stress Questionnaire    Feeling of Stress : Not at all  Social Connections: Socially Integrated (02/15/2023)   Social Connection and Isolation Panel [NHANES]    Frequency of Communication with Friends and Family: More than three times a week    Frequency of Social Gatherings with Friends and Family: More than three times a week    Attends Religious Services: 1 to 4 times per year    Active Member of [employer] or Organizations: Yes    Attends Banker Meetings: 1 to 4 times per year    Marital Status: Married  Catering manager Violence: Not At Risk (02/15/2023)   Humiliation, Afraid, Rape, and Kick questionnaire    Fear of Current or Ex-Partner: No    Emotionally Abused: No    Physically Abused: No    Sexually Abused: No    Family History  Problem Relation Age of Onset   Arthritis Mother    Hypertension Father    Coronary artery disease Father    Hypertension Brother    Hypertension Other    Hypertension Brother    Coronary artery disease Other     Past Medical History:  Diagnosis Date   BPH (benign prostatic hypertrophy)    Hyperlipidemia    Hypertension    LBP (low back pain)    MVP (mitral valve prolapse)    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   on  cellcept   Osteoarthritis    PAC (premature  atrial contraction)    Statin intolerance    Type II or unspecified type diabetes mellitus without mention of complication, not stated as uncontrolled 2011   Vitamin B 12 deficiency 2011   Vitamin D deficiency    Vitiligo     Patient Active Problem List   Diagnosis Date Noted   Cicatricial ocular pemphigoid 05/30/2023   Travel advice encounter 04/05/2023   Diverticular disease of colon 01/04/2023   Gastroesophageal reflux disease 01/04/2023   Iron deficiency anemia 01/04/2023   Loss of appetite 01/04/2023   History of colonic polyps 01/04/2023   Long-term use of high-risk medication 12/03/2022   Asymmetric SNHL (sensorineural hearing loss) 07/14/2022   Presbycusis of both ears 07/14/2022   Hearing loss 05/31/2022   Pseudophakia of both eyes 07/12/2021   Malnutrition (HCC) 06/23/2021   Episodic cluster headache, not intractable 06/09/2021   Ocular pemphigoid 05/19/2021   Chronic sinusitis 05/12/2021   Depression 05/12/2021   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   Right sided temporal headache 01/06/2021   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   Chronic renal insufficiency, stage 3 (moderate) (HCC) 05/11/2020   Pemphigoid 05/10/2020   Elevated serum creatinine 07/11/2018   Hip pain, acute, right 01/10/2018   Postoperative anemia due to acute blood loss 09/30/2014   Status post left partial knee replacement 09/15/2014   S/P left UKR 09/14/2014   S/P right TKA 06/15/2014   Osteoarthritis, knee 11/24/2013   Weight loss 06/24/2013   PAC (premature atrial contraction) 01/13/2013   Heart murmur 01/13/2013   Well adult exam 11/03/2012   Paresthesia 10/22/2012   Hypertension 04/18/2012   Diabetes type 2, controlled (HCC) 10/20/2010   B12 deficiency 10/20/2010   Vitamin D deficiency 10/20/2010   Dyslipidemia 07/14/2010   BENIGN PROSTATIC HYPERTROPHY 07/14/2010   OSTEOARTHRITIS 07/14/2010   LOW BACK PAIN 07/14/2010    HYPERGLYCEMIA 07/14/2010    Past Surgical History:  Procedure Laterality Date   arm surgery Left    ORIF left  forearm   CATARACT EXTRACTION  2012   EYE SURGERY     eye surgery Right 05/02/2022   remove scar on cornea and then put amniotic cells on it   eyelid surgery Right    HERNIA REPAIR Bilateral 1971   PARTIAL KNEE ARTHROPLASTY Left 09/14/2014   Procedure: LEFT UNICOMPARTMENTAL KNEE ARTHROPLASTY MEDIALLY;  Surgeon: Shelda Pal, Kemp;  Location: [facility];  Service: Orthopedics;  Laterality: Left;   TOTAL KNEE ARTHROPLASTY Right 06/15/2014   Procedure: RIGHT TOTAL KNEE ARTHROPLASTY;  Surgeon: Shelda Pal, Kemp;  Location: [facility];  Service: Orthopedics;  Laterality: Right;    Current Outpatient Medications  Medication Sig Dispense Refill   Cholecalciferol (VITAMIN D) 2000 UNITS CAPS Take 1 capsule by mouth every other day.     cyanocobalamin (VITAMIN B12) 1000 MCG/ML injection Inject 1 mL (1,000 mcg total) into the skin every 14 (fourteen) days. 10 mL 3   diphenoxylate-atropine (LOMOTIL) 2.5-0.025 MG tablet Take 1-2 tablets by mouth 4 (four) times daily as needed for diarrhea or loose stools. 60 tablet 0   escitalopram (LEXAPRO) 5 MG tablet Take 1 tablet (5 mg total) by mouth at bedtime. 30 tablet 5   fenofibrate (TRICOR) 145 MG tablet Take 145 mg by mouth daily.     Galcanezumab-gnlm (EMGALITY, 300 MG DOSE,) 100 MG/ML SOSY Inject 300 mg (3 pens) into the skin every 30 days during cluster headache cycle and stop when cycle is over. 3 mL 11   HYDROcodone-acetaminophen (NORCO) 7.5-325 MG tablet Take 1-2  tablets by mouth every 6 (six) hours as needed for moderate pain. 60 tablet 0   Melatonin 10 MG CAPS Take 10 capsules by mouth at bedtime.     metFORMIN (GLUCOPHAGE) 500 MG tablet TAKE ONE TABLET BY MOUTH THREE TIMES DAILY 270 tablet 1   methocarbamol (ROBAXIN) 500 MG tablet Take 1 tablet (500 mg total) by mouth 4 (four) times daily as needed for muscle spasms. 120 tablet 11   PROLENSA  0.07 % SOLN Apply to eye.     riTUXimab-abbs (TRUXIMA) 100 MG/10ML injection Inject into the vein. Injection 3 times a year     tamsulosin (FLOMAX) 0.4 MG CAPS capsule TAKE ONE CAPSULE BY MOUTH DAILY 90 capsule 1   traZODone (DESYREL) 50 MG tablet Take 0.5-1 tablets (25-50 mg total) by mouth at bedtime. 90 tablet 3   typhoid (VIVOTIF) DR capsule Take 1 capsule by mouth every other day. 4 capsule 0   verapamil (CALAN-SR) 240 MG CR tablet Take 1 tablet (240 mg total) by mouth at bedtime. Please stop amlodipine. 90 tablet 3   No current facility-administered medications for this visit.    Allergies as of 12/05/2023 - Review Complete 08/20/2023  Allergen Reaction Noted   Statins  06/23/2021   Atorvastatin  10/20/2010   Rosuvastatin  10/20/2010   Zonisamide  05/12/2021    Vitals: There were no vitals taken for this visit. Last Weight:  Wt Readings from Last 1 Encounters:  08/20/23 129 lb (58.5 kg)   Last Height:   Ht Readings from Last 1 Encounters:  08/20/23 5\' 7"  (1.702 m)   Exam: NAD, pleasant                  Speech:    Speech is normal; fluent and spontaneous with normal comprehension.  Cognition:    The patient is oriented to person, place, and time;     recent and remote memory intact;     language fluent;    Cranial Nerves:    The pupils are equal, round, and reactive to light.Trigeminal sensation is intact and the muscles of mastication are normal. The face is symmetric. The palate elevates in the midline. Hearing intact. Voice is normal. Shoulder shrug is normal. The tongue has normal motion without fasciculations.   Coordination:  No dysmetria  Motor Observation:    No asymmetry, no atrophy, and no involuntary movements noted. Tone:    Normal muscle tone.     Strength:    Strength is V/V in the upper and lower limbs.      Sensation: intact to LT   Assessment/Plan:   82 y.o. male here as requested by Louis Kemp . PMHx ocular pemphigoid, B12  deficiency, HTN, PAC, DM2. 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 Ultimately, patients affected by this autoimmune disease will experience conjunctival cicatrization or scarring.He may need to be on Rituximab for life, has had a great response to rituximab, his vision is stable except as he gets closer to needigng his injection so still needs it q58months, vision is still poor but stable, doing great on injection unless getting close to his injection time which means he is still needing his injections every 4 months since his vision seems to start to wrosen if he does not  get it every 4 months, otherwise does fantastic.    he is doing well on the rituximab no side effects  his vision is better but worsens near the end of the infusion cycle which means he needs to continue for one more year likely, the pain is better, lacrimation is better, we will continue Rituximab, it has improved his symptoms by >70% and has improved his quality of life by > 70%. We will continue to next 2 infusions and if still doing well an dnot wearing off by then, we will continue with 2 more infusions and then stop to see if he remains significantly improved.  He is being treated by [facility] and his Dr. Carlynn Kemp ophthalmologist left and being treated by Dr. Jackie Kemp.  Continue emgality for cluster headaches.   Cluster headache: - Doing great on Emgality continue for cluster headaches go back to 2 monthly  Ocular cicatricial pemphigoid (OCP)  - Continue: Rituximab and continue to follow with [facility]. For ocular pemphigoid. 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 Ultimately, patients affected by this autoimmune disease will experience conjunctival cicatrization or scarring.He may need to be on Rituximab for life, Continue rituximab q63m.  - Blood test prior to infusion next one is 26th June, continue every 4 months  Chronic back pain - Opioid and robaxin for back pain  PRIOR ASSESSMENT AND PLANS: Better with verapamil and Emgality. Now on Verapamil 240mg  a day (cannot tolerate higher) for cluster headache dosage. Appears to be responding to cluster headache treatment excellent. Tremendous improvement on emgality from daily severe multiple episodes of cluster headaches to 2x a month. Still having the headaches twice a month. It may last for 6 hours or 8 hours.  We discussed acute management when he does have headaches, discussed options and decided: At onset of headache try Ubrelvy with norco at onset of headache. Can repeat ubrelvy 2 hours later if needed, Can also try injectable Zembrace with the norco: Take one injection every 15 minutes if needed max 4 in one day gave samples  Nerve blocks, sphenopalatine blocks,Indomethacin, lidocaine nasal spray did not help. He had side effects to imitrex. He did not tolerate topamax but may have helped, neither tolerated zonegran. Botox did not help, nurtec did not help, he declined depakote, , high dose prednisone did not help significantly. has resolved ptosis from last botox injections. oxygen 15 L high flow for acute events did not help, will cancel order  MRI of the brain was negative as were carotid dopplers.  Ordered CT of the orbits which showed swelling underneath the right eyelid suggestive of orbital cellulitis,finished Keflex 500mg  bid start 7 days still swollen  Continue: Rituximab and continue to follow with [facility]. For ocular pemphigoid   PRIOR Assessment and plan:   I spoke to patient's [facility] physician, we are going to try and get infusion here in the office. He  also continues on Prednisone 60mg . Temple pain/headache is 85% better but he still has pain supraorbitally however only at night or in the afternoon. But he is very improved. We discussed titrating the steroids and we will stay on 60mg  for one month then slowly decrease by 10mg  monthly. We discussed checking hgba1c and his glucose, I recommended he check his glucose daily, he is feeling well, no problems, his eyes feel much better and are not as red or painful. We received some notes from Dr. Carlynn Kemp, but no  orders, I have contacted him to get specifics on Rituximab including the pre-treatment, the injection doses and any labwork he wants prior to infusions. He is taking omeprazole daily, taking steroids with food as well, no GI upset.  Unclear etiology of headache but it has to be inflammatory/vascular or somehow related to the ocular pemphigoid, I doubt he would develop migraines or cluster headache at the age of 73 but trigeminal autonomic cephalalgias in the differential. Still we spoke about trying Depakote or other migraine medication(I do not think this is a migraine but it still may benefit from these medications). We decided to stay with the prednisone as it is helping after only 2 weeks and can consider other medications if needed (he is not enthusiastic about topamax or gabapentin, nurtec helped a little, wonder if qulipta would be helpful or depakote)  PRIOR Patient is on cellcept so I would think Temporal Arteritis(GCA) less likely however he has a new right temporal headache (he has no significant headache history) over the last 2 weeks with jaw symptoms and worsening vision. I am going to treat him for GCA and order esr/crp however not sure they will be reliable. Will order Ultra Sound of the temporal arteries, if esr/crp elevated we can discuss a biopsy but may be elevated due to his current inflammatory eye disease. I have contacted his ocular immunologist Dr. Carlynn Kemp.   (Addendum: ESR 31(ULN  30), CRP 13(ULN 10), temporal artery ultrasound without evidence of halo sign)  Earlyne Iba, Kemp ocular immunologist [phone]        Cc: Louis Kemp,  Earlyne Iba, Kemp ocular immunologist [phone]  Naomie Dean, Kemp  [facility] [address] [place], [place] [address]  I spent over 20 minutes of face-to-face and non-face-to-face time with patient on the  1. Cicatricial ocular pemphigoid   2. Episodic cluster headache, not intractable    diagnosis.  This included previsit chart review, lab review, study review, order entry, electronic health record documentation, patient education on the different diagnostic and therapeutic options, counseling and coordination of care, risks and benefits of management, compliance, or risk factor reduction

## 2023-12-20 ENCOUNTER — Ambulatory Visit (INDEPENDENT_AMBULATORY_CARE_PROVIDER_SITE_OTHER): Payer: Medicare Other | Admitting: Internal Medicine

## 2023-12-20 ENCOUNTER — Encounter: Payer: Self-pay | Admitting: Internal Medicine

## 2023-12-20 VITALS — BP 130/80 | HR 72 | Temp 98.3°F | Ht 65.0 in | Wt 132.0 lb

## 2023-12-20 DIAGNOSIS — E559 Vitamin D deficiency, unspecified: Secondary | ICD-10-CM | POA: Diagnosis not present

## 2023-12-20 DIAGNOSIS — R351 Nocturia: Secondary | ICD-10-CM | POA: Insufficient documentation

## 2023-12-20 DIAGNOSIS — N320 Bladder-neck obstruction: Secondary | ICD-10-CM | POA: Diagnosis not present

## 2023-12-20 DIAGNOSIS — E785 Hyperlipidemia, unspecified: Secondary | ICD-10-CM | POA: Diagnosis not present

## 2023-12-20 DIAGNOSIS — G4709 Other insomnia: Secondary | ICD-10-CM

## 2023-12-20 DIAGNOSIS — N2889 Other specified disorders of kidney and ureter: Secondary | ICD-10-CM

## 2023-12-20 DIAGNOSIS — N183 Chronic kidney disease, stage 3 unspecified: Secondary | ICD-10-CM | POA: Diagnosis not present

## 2023-12-20 DIAGNOSIS — E538 Deficiency of other specified B group vitamins: Secondary | ICD-10-CM

## 2023-12-20 DIAGNOSIS — G4700 Insomnia, unspecified: Secondary | ICD-10-CM | POA: Insufficient documentation

## 2023-12-20 DIAGNOSIS — G629 Polyneuropathy, unspecified: Secondary | ICD-10-CM

## 2023-12-20 DIAGNOSIS — Z7984 Long term (current) use of oral hypoglycemic drugs: Secondary | ICD-10-CM | POA: Diagnosis not present

## 2023-12-20 DIAGNOSIS — E119 Type 2 diabetes mellitus without complications: Secondary | ICD-10-CM

## 2023-12-20 LAB — LIPID PANEL
Cholesterol: 166 mg/dL (ref 0–200)
HDL: 58.4 mg/dL (ref >39.00)
LDL Cholesterol: 92 mg/dL (ref 0–99)
NonHDL: 107.45
Total CHOL/HDL Ratio: 3
Triglycerides: 77 mg/dL (ref 0.0–149.0)
VLDL: 15.4 mg/dL (ref 0.0–40.0)

## 2023-12-20 LAB — CBC WITH DIFFERENTIAL/PLATELET
Basophils Absolute: 0 10*3/uL (ref 0.0–0.1)
Basophils Relative: 0.3 % (ref 0.0–3.0)
Eosinophils Absolute: 0.1 10*3/uL (ref 0.0–0.7)
Eosinophils Relative: 1.8 % (ref 0.0–5.0)
HCT: 41.1 % (ref 39.0–52.0)
Hemoglobin: 13.7 g/dL (ref 13.0–17.0)
Lymphocytes Relative: 35.1 % (ref 12.0–46.0)
Lymphs Abs: 2.5 10*3/uL (ref 0.7–4.0)
MCHC: 33.3 g/dL (ref 30.0–36.0)
MCV: 91.6 fL (ref 78.0–100.0)
Monocytes Absolute: 0.8 10*3/uL (ref 0.1–1.0)
Monocytes Relative: 10.9 % (ref 3.0–12.0)
Neutro Abs: 3.6 10*3/uL (ref 1.4–7.7)
Neutrophils Relative %: 51.9 % (ref 43.0–77.0)
Platelets: 348 10*3/uL (ref 150.0–400.0)
RBC: 4.49 Mil/uL (ref 4.22–5.81)
RDW: 13.9 % (ref 11.5–15.5)
WBC: 7 10*3/uL (ref 4.0–10.5)

## 2023-12-20 LAB — TSH: TSH: 1.03 u[IU]/mL (ref 0.35–5.50)

## 2023-12-20 LAB — COMPREHENSIVE METABOLIC PANEL
ALT: 15 U/L (ref 0–53)
AST: 22 U/L (ref 0–37)
Albumin: 4.4 g/dL (ref 3.5–5.2)
Alkaline Phosphatase: 64 U/L (ref 39–117)
BUN: 24 mg/dL — ABNORMAL HIGH (ref 6–23)
CO2: 28 meq/L (ref 19–32)
Calcium: 10.5 mg/dL (ref 8.4–10.5)
Chloride: 102 meq/L (ref 96–112)
Creatinine, Ser: 1.7 mg/dL — ABNORMAL HIGH (ref 0.40–1.50)
GFR: 37.29 mL/min — ABNORMAL LOW (ref >60.00)
Glucose, Bld: 103 mg/dL — ABNORMAL HIGH (ref 70–99)
Potassium: 4.4 meq/L (ref 3.5–5.1)
Sodium: 139 meq/L (ref 135–145)
Total Bilirubin: 0.5 mg/dL (ref 0.2–1.2)
Total Protein: 6.9 g/dL (ref 6.0–8.3)

## 2023-12-20 LAB — PSA: PSA: 0.88 ng/mL (ref 0.10–4.00)

## 2023-12-20 LAB — VITAMIN D 25 HYDROXY (VIT D DEFICIENCY, FRACTURES): VITD: 52.67 ng/mL (ref 30.00–100.00)

## 2023-12-20 LAB — URINALYSIS
Bilirubin Urine: NEGATIVE
Hgb urine dipstick: NEGATIVE
Ketones, ur: NEGATIVE
Leukocytes,Ua: NEGATIVE
Nitrite: NEGATIVE
Specific Gravity, Urine: 1.025 (ref 1.000–1.030)
Total Protein, Urine: NEGATIVE
Urine Glucose: NEGATIVE
Urobilinogen, UA: 0.2 (ref 0.0–1.0)
pH: 6.5 (ref 5.0–8.0)

## 2023-12-20 LAB — SEDIMENTATION RATE: Sed Rate: 15 mm/h (ref 0–20)

## 2023-12-20 LAB — HEMOGLOBIN A1C: Hgb A1c MFr Bld: 6 % (ref 4.6–6.5)

## 2023-12-20 LAB — VITAMIN B12: Vitamin B-12: 1308 pg/mL — ABNORMAL HIGH (ref 211–911)

## 2023-12-20 LAB — T4, FREE: Free T4: 0.99 ng/dL (ref 0.60–1.60)

## 2023-12-20 MED ORDER — VERAPAMIL HCL ER 240 MG PO TBCR: 240.0000 mg | EXTENDED_RELEASE_TABLET | Freq: Every day | ORAL | 3 refills | Status: DC

## 2023-12-20 MED ORDER — METFORMIN HCL 500 MG PO TABS: 500.0000 mg | ORAL_TABLET | Freq: Three times a day (TID) | ORAL | 3 refills | Status: AC

## 2023-12-20 MED ORDER — GABAPENTIN 100 MG PO CAPS: 100.0000 mg | ORAL_CAPSULE | Freq: Every day | ORAL | 1 refills | Status: DC

## 2023-12-20 MED ORDER — TAMSULOSIN HCL 0.4 MG PO CAPS: 0.4000 mg | ORAL_CAPSULE | Freq: Every day | ORAL | 3 refills | Status: AC

## 2023-12-20 NOTE — Assessment & Plan Note (Signed)
On Vit D 

## 2023-12-20 NOTE — Assessment & Plan Note (Signed)
Monitoring GFR Hydrate well

## 2023-12-20 NOTE — Assessment & Plan Note (Signed)
Check A1c. 

## 2023-12-20 NOTE — Patient Instructions (Addendum)
Blue-Emu cream --use 2-3 times a day   Warm home slippers, diabetic socks

## 2023-12-20 NOTE — Progress Notes (Signed)
Subjective:  Patient ID: Louis Kemp, male    DOB: 1941-08-29  Age: 82 y.o. MRN: [id number]  CC: Medical Management of Chronic Issues (4 MNTH F/U)   HPI Louis Kemp presents for feet neuropathy - worse C/o insomnia - new F/u Vit B12 and D def, DM-2   Outpatient Medications Prior to Visit  Medication Sig Dispense Refill   Cholecalciferol (VITAMIN D) 2000 UNITS CAPS Take 1 capsule by mouth every other day.     cyanocobalamin (VITAMIN B12) 1000 MCG/ML injection Inject 1 mL (1,000 mcg total) into the skin every 14 (fourteen) days. 10 mL 3   fenofibrate (TRICOR) 145 MG tablet Take 145 mg by mouth daily.     Galcanezumab-gnlm (EMGALITY, 300 MG DOSE,) 100 MG/ML SOSY Inject 300 mg (3 pens) into the skin every 30 days during cluster headache cycle and stop when cycle is over. 3 mL 11   Melatonin 10 MG CAPS Take 10 capsules by mouth at bedtime.     methocarbamol (ROBAXIN) 500 MG tablet Take 1 tablet (500 mg total) by mouth 4 (four) times daily as needed for muscle spasms. 120 tablet 11   prednisoLONE acetate (PRED FORTE) 1 % ophthalmic suspension 1 drop 4 (four) times daily.     PROLENSA 0.07 % SOLN Apply to eye.     riTUXimab-abbs (TRUXIMA) 100 MG/10ML injection Inject into the vein. Injection 3 times a year     traZODone (DESYREL) 50 MG tablet Take 0.5-1 tablets (25-50 mg total) by mouth at bedtime. 90 tablet 3   metFORMIN (GLUCOPHAGE) 500 MG tablet TAKE ONE TABLET BY MOUTH THREE TIMES DAILY 270 tablet 1   tamsulosin (FLOMAX) 0.4 MG CAPS capsule TAKE ONE CAPSULE BY MOUTH DAILY 90 capsule 1   verapamil (CALAN-SR) 240 MG CR tablet Take 1 tablet (240 mg total) by mouth at bedtime. Please stop amlodipine. 90 tablet 3   diphenoxylate-atropine (LOMOTIL) 2.5-0.025 MG tablet Take 1-2 tablets by mouth 4 (four) times daily as needed for diarrhea or loose stools. (Patient not taking: Reported on 12/20/2023) 60 tablet 0   escitalopram (LEXAPRO) 5 MG tablet Take 1 tablet (5 mg total) by mouth at  bedtime. (Patient not taking: Reported on 12/20/2023) 30 tablet 5   typhoid (VIVOTIF) DR capsule Take 1 capsule by mouth every other day. (Patient not taking: Reported on 12/20/2023) 4 capsule 0   No facility-administered medications prior to visit.    ROS: Review of Systems  Constitutional:  Positive for fatigue. Negative for appetite change and unexpected weight change.  HENT:  Negative for congestion, nosebleeds, sneezing, sore throat and trouble swallowing.   Eyes:  Positive for visual disturbance. Negative for itching.  Respiratory:  Negative for cough.   Cardiovascular:  Negative for chest pain, palpitations and leg swelling.  Gastrointestinal:  Negative for abdominal distention, blood in stool, diarrhea and nausea.  Genitourinary:  Negative for frequency and hematuria.  Musculoskeletal:  Negative for back pain, gait problem, joint swelling and neck pain.  Skin:  Negative for rash.  Neurological:  Positive for numbness. Negative for dizziness, tremors, speech difficulty and weakness.  Psychiatric/Behavioral:  Negative for agitation, dysphoric mood and sleep disturbance. The patient is not nervous/anxious.     Objective:  BP 130/80 (BP Location: Right Arm, Patient Position: Sitting, Cuff Size: Normal)   Pulse 72   Temp 98.3 F (36.8 C) (Oral)   Ht 5\' 5"  (1.651 m)   Wt 132 lb (59.9 kg)   SpO2 97%   BMI 21.97  kg/m   BP Readings from Last 3 Encounters:  12/20/23 130/80  12/05/23 136/86  08/20/23 112/68    Wt Readings from Last 3 Encounters:  12/20/23 132 lb (59.9 kg)  12/05/23 133 lb (60.3 kg)  08/20/23 129 lb (58.5 kg)    Physical Exam Constitutional:      General: He is not in acute distress.    Appearance: He is well-developed.     Comments: NAD  Eyes:     Conjunctiva/sclera: Conjunctivae normal.     Pupils: Pupils are equal, round, and reactive to light.  Neck:     Thyroid: No thyromegaly.     Vascular: No JVD.  Cardiovascular:     Rate and Rhythm: Normal  rate and regular rhythm.     Heart sounds: Normal heart sounds. No murmur heard.    No friction rub. No gallop.  Pulmonary:     Effort: Pulmonary effort is normal. No respiratory distress.     Breath sounds: Normal breath sounds. No wheezing or rales.  Chest:     Chest wall: No tenderness.  Abdominal:     General: Bowel sounds are normal. There is no distension.     Palpations: Abdomen is soft. There is no mass.     Tenderness: There is no abdominal tenderness. There is no guarding or rebound.  Musculoskeletal:        General: No tenderness. Normal range of motion.     Cervical back: Normal range of motion.  Lymphadenopathy:     Cervical: No cervical adenopathy.  Skin:    General: Skin is warm and dry.     Findings: No rash.  Neurological:     Mental Status: He is alert and oriented to person, place, and time.     Cranial Nerves: No cranial nerve deficit.     Motor: No abnormal muscle tone.     Coordination: Coordination normal.     Gait: Gait normal.     Deep Tendon Reflexes: Reflexes are normal and symmetric.  Psychiatric:        Behavior: Behavior normal.        Thought Content: Thought content normal.        Judgment: Judgment normal.   Poor vision B feet WNL  Lab Results  Component Value Date   WBC 7.3 10/24/2023   HGB 14.1 10/24/2023   HCT 43.3 10/24/2023   PLT 394 10/24/2023   GLUCOSE 130 (H) 10/24/2023   CHOL 161 05/31/2022   TRIG 108.0 05/31/2022   HDL 54.80 05/31/2022   LDLCALC 85 05/31/2022   ALT 16 10/24/2023   AST 35 10/24/2023   NA 140 10/24/2023   K 5.2 10/24/2023   CL 103 10/24/2023   CREATININE 1.54 (H) 10/24/2023   BUN 19 10/24/2023   CO2 17 (L) 10/24/2023   TSH 1.31 04/05/2023   PSA 0.78 01/04/2023   INR 1.01 09/03/2014   HGBA1C 6.0 08/20/2023   MICROALBUR 1.2 04/05/2023    MR BRAIN/IAC W WO CONTRAST Result Date: 07/31/2022 CLINICAL DATA:  Provided history: Hearing loss. Additional history provided by scanning technologist: Patient  reports left greater than right hearing loss for 6 months. Blurred vision. EXAM: MRI HEAD WITHOUT AND WITH CONTRAST TECHNIQUE: Multiplanar, multiecho pulse sequences of the brain and surrounding structures were obtained without and with intravenous contrast. CONTRAST:  12mL MULTIHANCE GADOBENATE DIMEGLUMINE 529 MG/ML IV SOLN COMPARISON:  CT of the orbits 06/20/2021. Brain MRI 01/05/2021. FINDINGS: Intermittently motion degraded examination. Most notably, there is mild-to-moderate  motion degradation of the axial high-resolution heavily T2 weighted sequence through the internal auditory canals and moderate motion degradation of the whole brain axial T1 weighted post-contrast sequence. Brain: Mild to moderate generalized parenchymal atrophy. Multifocal T2 FLAIR hyperintense signal abnormality within the cerebral white matter, nonspecific but compatible with mild chronic small vessel ischemic disease. No evidence of Louis intracranial mass. Specifically, no cerebellopontine angle or internal auditory canal mass is demonstrated. Unremarkable appearance of the 7th and 8th cranial nerves bilaterally. There is no acute infarct. No chronic intracranial blood products. No extra-axial fluid collection. No midline shift. No pathologic intracranial enhancement identified. Vascular: Maintained flow voids within the proximal large arterial vessels. Skull and upper cervical spine: No focal suspicious marrow lesion. Incompletely assessed cervical spondylosis. Sinuses/Orbits: No mass or acute finding within the imaged orbits. Prior bilateral ocular lens replacement. Small mucous retention cysts within the bilateral maxillary sinuses. Trace mucosal thickening within the bilateral ethmoid sinuses. IMPRESSION: Intermittently motion degraded examination. No evidence of acute intracranial abnormality. No cerebellopontine angle or internal auditory canal mass. Mild chronic small vessel ischemic changes within the cerebral white matter,  similar to the prior brain MRI of 01/05/2021. Mild-to-moderate generalized parenchymal atrophy. Mild paranasal sinus disease, as described. Electronically Signed   By: Jackey Loge D.O.   On: 07/31/2022 08:01    Assessment & Plan:   Problem List Items Addressed This Visit     Diabetes type 2, controlled (HCC)   Check A1c      Relevant Medications   metFORMIN (GLUCOPHAGE) 500 MG tablet   Other Relevant Orders   Hemoglobin A1c   Comprehensive metabolic panel   CBC with Differential/Platelet   TSH   T4, free   Vitamin B 12 deficiency   Chronic Cont w/B12 injections      Relevant Orders   Vitamin B12   Vitamin D deficiency   On Vit D      Relevant Orders   VITAMIN D 25 Hydroxy (Vit-D Deficiency, Fractures)   Dyslipidemia   Relevant Orders   TSH   Lipid panel   Chronic renal insufficiency, stage 3 (moderate) (HCC)   Monitoring GFR Hydrate well      Neuropathy - Primary   Worse Try Gabapentin low dose at hs Blue-Emu cream --use 2-3 times a day      Relevant Orders   Hemoglobin A1c   Comprehensive metabolic panel   CBC with Differential/Platelet   TSH   T4, free   Vitamin B12   VITAMIN D 25 Hydroxy (Vit-D Deficiency, Fractures)   Urinalysis   PSA   Lipid panel   Sedimentation rate   Insomnia disorder   New Start Gabapentin      Nocturia   Check UA Consider Cialis      Relevant Orders   Urinalysis   PSA   Other Visit Diagnoses       Bladder neck obstruction       Relevant Orders   PSA         Meds ordered this encounter  Medications   metFORMIN (GLUCOPHAGE) 500 MG tablet    Sig: Take 1 tablet (500 mg total) by mouth 3 (three) times daily.    Dispense:  270 tablet    Refill:  3   tamsulosin (FLOMAX) 0.4 MG CAPS capsule    Sig: Take 1 capsule (0.4 mg total) by mouth daily.    Dispense:  90 capsule    Refill:  3   verapamil (CALAN-SR) 240 MG CR tablet  Sig: Take 1 tablet (240 mg total) by mouth at bedtime. Please stop amlodipine.     Dispense:  90 tablet    Refill:  3   gabapentin (NEURONTIN) 100 MG capsule    Sig: Take 1-2 capsules (100-200 mg total) by mouth at bedtime.    Dispense:  180 capsule    Refill:  1      Follow-up: Return in about 3 months (around 03/19/2024) for a follow-up visit.  Sonda Primes, MD

## 2023-12-20 NOTE — Assessment & Plan Note (Signed)
Check UA Consider Cialis

## 2023-12-20 NOTE — Assessment & Plan Note (Signed)
Worse Try Gabapentin low dose at hs Blue-Emu cream --use 2-3 times a day

## 2023-12-20 NOTE — Assessment & Plan Note (Signed)
New Start Gabapentin

## 2023-12-20 NOTE — Assessment & Plan Note (Signed)
Chronic Cont w/B12 injections

## 2023-12-27 DIAGNOSIS — Z79899 Other long term (current) drug therapy: Secondary | ICD-10-CM | POA: Diagnosis not present

## 2023-12-27 DIAGNOSIS — H1089 Other conjunctivitis: Secondary | ICD-10-CM | POA: Diagnosis not present

## 2023-12-27 DIAGNOSIS — H11233 Symblepharon, bilateral: Secondary | ICD-10-CM | POA: Diagnosis not present

## 2024-02-01 ENCOUNTER — Ambulatory Visit (INDEPENDENT_AMBULATORY_CARE_PROVIDER_SITE_OTHER): Payer: Medicare Other

## 2024-02-01 VITALS — Ht 65.0 in | Wt 132.0 lb

## 2024-02-01 DIAGNOSIS — Z0000 Encounter for general adult medical examination without abnormal findings: Secondary | ICD-10-CM | POA: Diagnosis not present

## 2024-02-01 NOTE — Patient Instructions (Signed)
 Mr. Lanum , Thank you for taking time to come for your Medicare Wellness Visit. I appreciate your ongoing commitment to your health goals. Please review the following plan we discussed and let me know if I can assist you in the future.   Referrals/Orders/Follow-Ups/Clinician Recommendations: It was nice talking to you today.  You are due for Tetanus vaccine.    This is a list of the screening recommended for you and due dates:  Health Maintenance  Topic Date Due   Complete foot exam   Never done   Eye exam for diabetics  05/06/2022   COVID-19 Vaccine (4 - 2024-25 season) 08/26/2023   DTaP/Tdap/Td vaccine (2 - Tdap) 03/25/2024   Yearly kidney health urinalysis for diabetes  04/04/2024   Hemoglobin A1C  06/19/2024   Yearly kidney function blood test for diabetes  12/19/2024   Medicare Annual Wellness Visit  01/31/2025   Pneumonia Vaccine  Completed   Flu Shot  Completed   Zoster (Shingles) Vaccine  Completed   HPV Vaccine  Aged Out    Advanced directives: (Copy Requested) Please bring a copy of your health care power of attorney and living will to the office to be added to your chart at your convenience.  Next Medicare Annual Wellness Visit scheduled for next year: Yes

## 2024-02-01 NOTE — Progress Notes (Signed)
 Subjective:   Louis Kemp is a 83 y.o. male who presents for Medicare Annual/Subsequent preventive examination.  Visit Complete: Virtual I connected with  Raekwon K Lysaght on 02/01/24 by a audio enabled telemedicine application and verified that I am speaking with the correct person using two identifiers.  Patient Location: Home  Provider Location: Office/Clinic  I discussed the limitations of evaluation and management by telemedicine. The patient expressed understanding and agreed to proceed.  Vital Signs: Because this visit was a virtual/telehealth visit, some criteria may be missing or patient reported. Any vitals not documented were not able to be obtained and vitals that have been documented are patient reported.   Cardiac Risk Factors include: advanced age (>24men, >32 women);male gender     Objective:    Today's Vitals   02/01/24 1344  Weight: 132 lb (59.9 kg)  Height: 5' 5 (1.651 m)   Body mass index is 21.97 kg/m.     02/01/2024    1:55 PM 02/15/2023    4:20 PM 10/08/2019    1:09 PM 09/14/2014    3:00 PM 09/03/2014   10:43 AM 06/15/2014    1:37 PM 06/15/2014    9:07 AM  Advanced Directives  Does Patient Have a Medical Advance Directive? Yes Yes Yes Yes Yes  Patient has advance directive, copy in chart  Type of Advance Directive Healthcare Power of [place];Living will Healthcare Power of [place];Living will Healthcare Power of [place];Living will Healthcare Power of [place];Living will Healthcare Power of [place];Living will Living will Living will  Copy of Healthcare Power of Attorney in Chart? No - copy requested No - copy requested No - copy requested  Yes    Pre-existing out of facility DNR order (yellow form or pink MOST form)      No     Current Medications (verified) Outpatient Encounter Medications as of 02/01/2024  Medication Sig   cyanocobalamin  (VITAMIN B12) 1000 MCG/ML injection Inject 1 mL (1,000 mcg total) into the skin every 14 (fourteen) days.    fenofibrate  (TRICOR ) 145 MG tablet Take 145 mg by mouth daily.   gabapentin  (NEURONTIN ) 100 MG capsule Take 1-2 capsules (100-200 mg total) by mouth at bedtime.   metFORMIN  (GLUCOPHAGE ) 500 MG tablet Take 1 tablet (500 mg total) by mouth 3 (three) times daily.   riTUXimab  (RITUXAN) 500 MG/50ML injection Inject into the vein every 6 (six) months.   tamsulosin  (FLOMAX ) 0.4 MG CAPS capsule Take 1 capsule (0.4 mg total) by mouth daily.   verapamil  (CALAN -SR) 240 MG CR tablet Take 1 tablet (240 mg total) by mouth at bedtime. Please stop amlodipine .   Cholecalciferol  (VITAMIN D ) 2000 UNITS CAPS Take 1 capsule by mouth every other day.   diphenoxylate -atropine  (LOMOTIL ) 2.5-0.025 MG tablet Take 1-2 tablets by mouth 4 (four) times daily as needed for diarrhea or loose stools. (Patient not taking: Reported on 12/20/2023)   escitalopram  (LEXAPRO ) 5 MG tablet Take 1 tablet (5 mg total) by mouth at bedtime. (Patient not taking: Reported on 12/20/2023)   Galcanezumab -gnlm (EMGALITY , 300 MG DOSE,) 100 MG/ML SOSY Inject 300 mg (3 pens) into the skin every 30 days during cluster headache cycle and stop when cycle is over.   Melatonin 10 MG CAPS Take 10 capsules by mouth at bedtime.   methocarbamol  (ROBAXIN ) 500 MG tablet Take 1 tablet (500 mg total) by mouth 4 (four) times daily as needed for muscle spasms. (Patient not taking: Reported on 02/01/2024)   prednisoLONE acetate (PRED FORTE) 1 % ophthalmic suspension  1 drop 4 (four) times daily.   PROLENSA 0.07 % SOLN Apply to eye.   riTUXimab -abbs (TRUXIMA ) 100 MG/10ML injection Inject into the vein. Injection 3 times a year   traZODone  (DESYREL ) 50 MG tablet Take 0.5-1 tablets (25-50 mg total) by mouth at bedtime.   typhoid (VIVOTIF) DR capsule Take 1 capsule by mouth every other day. (Patient not taking: Reported on 12/20/2023)   No facility-administered encounter medications on file as of 02/01/2024.    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   Osteoarthritis    PAC (premature atrial contraction)    Statin intolerance    Type II or unspecified type diabetes mellitus without mention of complication, not stated as uncontrolled 2011   Vitamin B 12 deficiency 2011   Vitamin D  deficiency    Vitiligo    Past Surgical History:  Procedure Laterality Date   arm surgery Left    ORIF left  forearm   CATARACT EXTRACTION  2012   EYE SURGERY     eye surgery Right 05/02/2022   remove scar on cornea and then put amniotic cells on it   eyelid surgery Right    HERNIA REPAIR Bilateral 1971   PARTIAL KNEE ARTHROPLASTY Left 09/14/2014   Procedure: LEFT UNICOMPARTMENTAL KNEE ARTHROPLASTY MEDIALLY;  Surgeon: Donnice JONETTA Car, MD;  Location: [facility];  Service: Orthopedics;  Laterality: Left;   TOTAL KNEE ARTHROPLASTY Right 06/15/2014   Procedure: RIGHT TOTAL KNEE ARTHROPLASTY;  Surgeon: Donnice JONETTA Car, MD;  Location: [facility];  Service: Orthopedics;  Laterality: Right;   Family History  Problem Relation Age of Onset   Arthritis Mother    Hypertension Father    Coronary artery disease Father    Hypertension Brother    Hypertension Other    Hypertension Brother    Coronary artery disease Other    Social History   Socioeconomic History   Marital status: Married    Spouse name: [name]   Number of children: Not on file   Years of education: Not on file   Highest education level: Not on file  Occupational History   Occupation: MD Retired  Tobacco Use   Smoking status: Former    Types: Cigars   Smokeless tobacco: Never   Tobacco comments:    3-4 cigars per year in the past but none in the past 10 years  Vaping Use   Vaping status: Never Used  Substance and Sexual Activity   Alcohol  use: Not Currently    Alcohol/week: 0.0 - 1.0 standard drinks of alcohol    Comment: maybe 1 mixed drink maybe once a month   Drug use: No   Sexual activity: Not Currently  Other Topics Concern   Not on file  Social History Narrative   Regular Exercise -  YES, golf   Lives at home with wife    Right handed   Caffeine :  1-2 cups/day      Lives with wife-2025   Social Drivers of Health   Financial Resource Strain: Low Risk  (02/01/2024)   Overall Financial Resource Strain (CARDIA)    Difficulty of Paying Living Expenses: Not hard at all  Food Insecurity: No Food Insecurity (02/01/2024)   Hunger  Vital Sign    Worried About Programme Researcher, Broadcasting/film/video in the Last Year: Never true    Ran Out of Food in the Last Year: Never true  Transportation Needs: No Transportation Needs (02/01/2024)   PRAPARE - Administrator, Civil Service (Medical): No    Lack of Transportation (Non-Medical): No  Physical Activity: Insufficiently Active (02/01/2024)   Exercise Vital Sign    Days of Exercise per Week: 4 days    Minutes of Exercise per Session: 30 min  Stress: No Stress Concern Present (02/01/2024)   [employer] of Occupational Health - Occupational Stress Questionnaire    Feeling of Stress : Not at all  Social Connections: Moderately Isolated (02/01/2024)   Social Connection and Isolation Panel [NHANES]    Frequency of Communication with Friends and Family: More than three times a week    Frequency of Social Gatherings with Friends and Family: More than three times a week    Attends Religious Services: Never    Database Administrator or Organizations: No    Attends Engineer, Structural: Never    Marital Status: Married    Tobacco Counseling Counseling given: Not Answered Tobacco comments: 3-4 cigars per year in the past but none in the past 10 years   Clinical Intake:  Pre-visit preparation completed: Yes  Pain : No/denies pain     BMI - recorded: 21.97 Nutritional  Status: BMI of 19-24  Normal Nutritional Risks: None Diabetes: Yes CBG done?: No Did pt. bring in CBG monitor from home?: No  How often do you need to have someone help you when you read instructions, pamphlets, or other written materials from your doctor or pharmacy?: 1 - Never  Interpreter Needed?: No  Information entered by :: Meliza Kage, RMA   Activities of Daily Living    02/01/2024    1:51 PM 02/15/2023    4:11 PM  In your present state of health, do you have any difficulty performing the following activities:  Hearing? 1 0  Comment wears hearing aides   Vision? 1 1  Comment Pseudophakia of both eyes   Difficulty concentrating or making decisions? 0 0  Walking or climbing stairs? 0 0  Dressing or bathing? 0 0  Doing errands, shopping? 0 1  Comment wife drives him/ [place] Patient does not drive due to eye condition.  Preparing Food and eating ? N N  Using the Toilet? N N  In the past six months, have you accidently leaked urine? N N  Do you have problems with loss of bowel control? N N  Managing your Medications? N N  Managing your Finances? N N  Housekeeping or managing your Housekeeping? N N    Patient Care Team: [name], [name], MD as PCP - General Camillo Golas, MD (Ophthalmology) Kristie Lamprey, MD as Attending Physician (Gastroenterology) Ernie Cough, MD as Attending Physician (Orthopedic Surgery) Delford Maude BROCKS, MD as Consulting Physician (Cardiology) Francella Carpen, Steffan Rigg, MD as Referring Physician (Ophthalmology)  Indicate any recent Medical Services you may have received from other than [facility] providers in the past year (date may be approximate).     Assessment:   This is a routine wellness examination for [place].  Hearing/Vision screen Hearing Screening - Comments:: Wears hearing aides   Goals Addressed   None   Depression Screen    02/01/2024    1:59 PM 12/20/2023    1:22 PM 08/20/2023    1:23 PM 04/05/2023  1:15 PM 02/15/2023     4:19 PM 01/04/2023    1:17 PM 09/04/2022   11:29 AM  PHQ 2/9 Scores  PHQ - 2 Score 0 0 0 0 0 0 0  PHQ- 9 Score 0 0 0 0 1 1 0    Fall Risk    02/01/2024    1:56 PM 12/20/2023    1:22 PM 08/20/2023    1:23 PM 04/05/2023    1:15 PM 02/15/2023    4:08 PM  Fall Risk   Falls in the past year? 0 0 0 0 0  Number falls in past yr: 0 0 0 0 0  Injury with Fall? 0 0 0 0 0  Risk for fall due to : No Fall Risks No Fall Risks No Fall Risks No Fall Risks No Fall Risks  Follow up Falls prevention discussed;Falls evaluation completed Falls evaluation completed Falls evaluation completed Falls evaluation completed Falls prevention discussed    MEDICARE RISK AT HOME: Medicare Risk at Home Any stairs in or around the home?: No Home free of loose throw rugs in walkways, pet beds, electrical cords, etc?: Yes Adequate lighting in your home to reduce risk of falls?: Yes Life alert?: No Use of a cane, walker or w/c?: Yes (cane) Grab bars in the bathroom?: Yes Shower chair or bench in shower?: Yes Elevated toilet seat or a handicapped toilet?: Yes  TIMED UP AND GO:  Was the test performed?  No    Cognitive Function:        02/01/2024    1:57 PM 02/15/2023    4:24 PM  6CIT Screen  What Year? 0 points 0 points  What month? 0 points 0 points  What time? 0 points 0 points  Count back from 20 0 points 0 points  Months in reverse 0 points 0 points  Repeat phrase 0 points 0 points  Total Score 0 points 0 points    Immunizations Immunization History  Administered Date(s) Administered   Fluad Quad(high Dose 65+) 11/10/2020, 09/04/2022   Fluad Trivalent(High Dose 65+) 08/20/2023   Influenza Split 10/20/2011   Influenza Whole 10/20/2010, 09/14/2012   Influenza, High Dose Seasonal PF 09/26/2018, 09/25/2019   Influenza,inj,Quad PF,6+ Mos 09/30/2014   Influenza,inj,Quad PF,6-35 Mos 10/01/2021   Influenza-Unspecified 09/17/2013, 10/26/2015, 10/25/2016   PFIZER(Purple Top)SARS-COV-2 Vaccination  01/09/2020, 01/27/2020   Pfizer Covid-19 Vaccine Bivalent Booster 11yrs & up 09/04/2022   Pneumococcal Conjugate-13 11/24/2013   Pneumococcal Polysaccharide-23 07/14/2010, 05/12/2021   Td 03/25/2014   Zoster Recombinant(Shingrix) 04/03/2018, 06/07/2018   Zoster, Live 07/14/2010    TDAP status: Up to date  Flu Vaccine status: Up to date  Pneumococcal vaccine status: Up to date  Covid-19 vaccine status: Completed vaccines  Qualifies for Shingles Vaccine? Yes   Zostavax completed Yes   Shingrix Completed?: Yes  Screening Tests Health Maintenance  Topic Date Due   FOOT EXAM  Never done   OPHTHALMOLOGY EXAM  05/06/2022   COVID-19 Vaccine (4 - 2024-25 season) 08/26/2023   DTaP/Tdap/Td (2 - Tdap) 03/25/2024   Diabetic kidney evaluation - Urine ACR  04/04/2024   HEMOGLOBIN A1C  06/19/2024   Diabetic kidney evaluation - eGFR measurement  12/19/2024   Medicare Annual Wellness (AWV)  01/31/2025   Pneumonia Vaccine 80+ Years old  Completed   INFLUENZA VACCINE  Completed   Zoster Vaccines- Shingrix  Completed   HPV VACCINES  Aged Out    Health Maintenance  Health Maintenance Due  Topic Date Due   FOOT EXAM  Never done   OPHTHALMOLOGY EXAM  05/06/2022   COVID-19 Vaccine (4 - 2024-25 season) 08/26/2023    Colorectal cancer screening: No longer required.   Lung Cancer Screening: (Low Dose CT Chest recommended if Age 53-80 years, 20 pack-year currently smoking OR have quit w/in 15years.) does not qualify.   Lung Cancer Screening Referral: N/A  Additional Screening:  Hepatitis C Screening: does not qualify;   Vision Screening: Recommended annual ophthalmology exams for early detection of glaucoma and other disorders of the eye. Is the patient up to date with their annual eye exam?  Yes  Who is the provider or what is the name of the office in which the patient attends annual eye exams? Dr. Steffan Concetta [place] If pt is not established with a provider, would they like to be  referred to a provider to establish care? No .   Dental Screening: Recommended annual dental exams for proper oral hygiene  Diabetic Foot Exam: Diabetic Foot Exam: Overdue, Pt has been advised about the importance in completing this exam. Pt is scheduled for diabetic foot exam on 03/19/24.  Community Resource Referral / Chronic Care Management: CRR required this visit?  No   CCM required this visit?  No     Plan:     I have personally reviewed and noted the following in the patient's chart:   Medical and social history Use of alcohol, tobacco or illicit drugs  Current medications and supplements including opioid prescriptions. Patient is not currently taking opioid prescriptions. Functional ability and status Nutritional status Physical activity Advanced directives List of other physicians Hospitalizations, surgeries, and ER visits in previous 12 months Vitals Screenings to include cognitive, depression, and falls Referrals and appointments  In addition, I have reviewed and discussed with patient certain preventive protocols, quality metrics, and best practice recommendations. A written personalized care plan for preventive services as well as general preventive health recommendations were provided to patient.     Saray Capasso L Keilee Denman, CMA   02/01/2024   After Visit Summary: (MyChart) Due to this being a telephonic visit, the after visit summary with patients personalized plan was offered to patient via MyChart   Nurse Notes: Patient will be due for a Tdap and would like to get it during his next office visit.  I requested records for eye exam today.  Patient had no other concerns to address today.

## 2024-02-04 DIAGNOSIS — H02051 Trichiasis without entropian right upper eyelid: Secondary | ICD-10-CM | POA: Diagnosis not present

## 2024-02-04 DIAGNOSIS — H11233 Symblepharon, bilateral: Secondary | ICD-10-CM | POA: Diagnosis not present

## 2024-02-04 DIAGNOSIS — H02054 Trichiasis without entropian left upper eyelid: Secondary | ICD-10-CM | POA: Diagnosis not present

## 2024-02-04 DIAGNOSIS — H02115 Cicatricial ectropion of left lower eyelid: Secondary | ICD-10-CM | POA: Diagnosis not present

## 2024-02-04 LAB — HM DIABETES EYE EXAM

## 2024-02-18 ENCOUNTER — Other Ambulatory Visit: Payer: Self-pay | Admitting: Internal Medicine

## 2024-02-20 ENCOUNTER — Other Ambulatory Visit: Payer: Self-pay | Admitting: Internal Medicine

## 2024-02-20 NOTE — Telephone Encounter (Signed)
 Copied from CRM [id number]. Topic: Clinical - Medication Refill >> Feb 20, 2024  1:52 PM Corin V wrote: Most Recent Primary Care Visit:  Provider: Wyvonne Lenz  Department: [facility]  Visit Type: MEDICARE AWV, SEQUENTIAL  Date: 02/01/2024  Medication: fenofibrate 160 MG tablet  Has the patient contacted their pharmacy? Yes- Per pharmacy PCP denied refill (Agent: If no, request that the patient contact the pharmacy for the refill. If patient does not wish to contact the pharmacy document the reason why and proceed with request.) (Agent: If yes, when and what did the pharmacy advise?)  Is this the correct pharmacy for this prescription? Yes If no, delete pharmacy and type the correct one.  This is the patient's preferred pharmacy:  [facility] [place], [place] - [address] [place] [address] Phone: [phone] Fax: [phone]  Has the prescription been filled recently? No  Is the patient out of the medication? Yes  Has the patient been seen for an appointment in the last year OR does the patient have an upcoming appointment? Yes  Can we respond through MyChart? No  Agent: Please be advised that Rx refills may take up to 3 business days. We ask that you follow-up with your pharmacy.

## 2024-02-21 ENCOUNTER — Other Ambulatory Visit: Payer: Self-pay | Admitting: Internal Medicine

## 2024-02-21 ENCOUNTER — Telehealth: Payer: Self-pay | Admitting: Internal Medicine

## 2024-02-21 MED ORDER — FENOFIBRATE 145 MG PO TABS: 145.0000 mg | ORAL_TABLET | Freq: Every day | ORAL | 3 refills | Status: AC

## 2024-02-21 NOTE — Addendum Note (Signed)
 Addended by: [name] on: 02/21/2024 04:57 PM   Modules accepted: Orders

## 2024-02-21 NOTE — Telephone Encounter (Signed)
 Copied from CRM [id number]. Topic: Clinical - Prescription Issue >> Feb 21, 2024  1:52 PM Eunice Blase wrote: Reason for CRM: Pt called Dr. Pearlean Brownie wants to know why fenofibrate (TRICOR) 145 MG tablet was declined. He has an appt on 03/19/2024. Please call pt. Dr. Pearlean Brownie [phone].

## 2024-02-22 NOTE — Telephone Encounter (Signed)
 Pts medication was sent to [facility] on 02/21/2024 by PCP

## 2024-03-19 ENCOUNTER — Encounter: Payer: Self-pay | Admitting: Internal Medicine

## 2024-03-19 ENCOUNTER — Ambulatory Visit (INDEPENDENT_AMBULATORY_CARE_PROVIDER_SITE_OTHER): Payer: Medicare Other | Admitting: Internal Medicine

## 2024-03-19 VITALS — BP 118/78 | HR 72 | Temp 98.1°F | Ht 65.0 in | Wt 130.2 lb

## 2024-03-19 DIAGNOSIS — Z7984 Long term (current) use of oral hypoglycemic drugs: Secondary | ICD-10-CM | POA: Diagnosis not present

## 2024-03-19 DIAGNOSIS — R011 Cardiac murmur, unspecified: Secondary | ICD-10-CM

## 2024-03-19 DIAGNOSIS — E538 Deficiency of other specified B group vitamins: Secondary | ICD-10-CM

## 2024-03-19 DIAGNOSIS — E119 Type 2 diabetes mellitus without complications: Secondary | ICD-10-CM

## 2024-03-19 DIAGNOSIS — I10 Essential (primary) hypertension: Secondary | ICD-10-CM | POA: Diagnosis not present

## 2024-03-19 LAB — COMPREHENSIVE METABOLIC PANEL
ALT: 14 U/L (ref 0–53)
AST: 20 U/L (ref 0–37)
Albumin: 4.5 g/dL (ref 3.5–5.2)
Alkaline Phosphatase: 74 U/L (ref 39–117)
BUN: 19 mg/dL (ref 6–23)
CO2: 27 meq/L (ref 19–32)
Calcium: 9.9 mg/dL (ref 8.4–10.5)
Chloride: 103 meq/L (ref 96–112)
Creatinine, Ser: 1.46 mg/dL (ref 0.40–1.50)
GFR: 44.68 mL/min — ABNORMAL LOW (ref >60.00)
Glucose, Bld: 110 mg/dL — ABNORMAL HIGH (ref 70–99)
Potassium: 4.2 meq/L (ref 3.5–5.1)
Sodium: 139 meq/L (ref 135–145)
Total Bilirubin: 0.5 mg/dL (ref 0.2–1.2)
Total Protein: 7.5 g/dL (ref 6.0–8.3)

## 2024-03-19 LAB — HEMOGLOBIN A1C: Hgb A1c MFr Bld: 6 % (ref 4.6–6.5)

## 2024-03-19 MED ORDER — EMGALITY (300 MG DOSE) 100 MG/ML ~~LOC~~ SOSY
PREFILLED_SYRINGE | SUBCUTANEOUS | Status: AC
Start: 2024-03-19 — End: ?

## 2024-03-19 MED ORDER — GABAPENTIN 100 MG PO CAPS: 300.0000 mg | ORAL_CAPSULE | Freq: Every day | ORAL | 2 refills | Status: DC

## 2024-03-19 MED ORDER — METHOCARBAMOL 500 MG PO TABS: 500.0000 mg | ORAL_TABLET | Freq: Three times a day (TID) | ORAL | 1 refills | Status: AC | PRN

## 2024-03-19 NOTE — Assessment & Plan Note (Signed)
 Check A1c.

## 2024-03-19 NOTE — Assessment & Plan Note (Signed)
Chronic Cont w/B12 injections

## 2024-03-19 NOTE — Assessment & Plan Note (Signed)
On Hytrin, Coreg, Losatan

## 2024-03-19 NOTE — Assessment & Plan Note (Signed)
 Dr Eden Emms

## 2024-03-19 NOTE — Progress Notes (Signed)
 Subjective:  Patient ID: Louis Kemp, male    DOB: 03/27/1941  Age: 83 y.o. MRN: [id number]  CC: Medical Management of Chronic Issues (3 Month follow up. Patient notes that insomnia has aleviated but the neuropathy is still present and worsening. Still experiencing burning sensation in both feet, especially at night, causing difficulties when trying to sleep. ) and Back Pain (Lower back pain due to strenuous labor in yard)   HPI [name] presents for Medical Management of Chronic Issues (3 Month follow up. Patient notes that insomnia has aleviated but the neuropathy is still present and worsening. Still experiencing burning sensation in both feet, especially at night, causing difficulties when trying to sleep. ) and Back Pain (Lower back pain due to strenuous labor in yard) F/u on DM, HTN  Outpatient Medications Prior to Visit  Medication Sig Dispense Refill   Cholecalciferol (VITAMIN D) 2000 UNITS CAPS Take 1 capsule by mouth every other day.     cyanocobalamin (VITAMIN B12) 1000 MCG/ML injection Inject 1 mL (1,000 mcg total) into the skin every 14 (fourteen) days. 10 mL 3   fenofibrate (TRICOR) 145 MG tablet Take 1 tablet (145 mg total) by mouth daily. 90 tablet 3   Melatonin 10 MG CAPS Take 10 capsules by mouth at bedtime.     metFORMIN (GLUCOPHAGE) 500 MG tablet Take 1 tablet (500 mg total) by mouth 3 (three) times daily. 270 tablet 3   prednisoLONE acetate (PRED FORTE) 1 % ophthalmic suspension 1 drop 4 (four) times daily.     PROLENSA 0.07 % SOLN Apply to eye.     riTUXimab (RITUXAN) 500 MG/50ML injection Inject into the vein every 6 (six) months.     riTUXimab-abbs (TRUXIMA) 100 MG/10ML injection Inject into the vein. Injection 3 times a year     tamsulosin (FLOMAX) 0.4 MG CAPS capsule Take 1 capsule (0.4 mg total) by mouth daily. 90 capsule 3   traZODone (DESYREL) 50 MG tablet Take 0.5-1 tablets (25-50 mg total) by mouth at bedtime. 90 tablet 3   verapamil (CALAN-SR) 240 MG  CR tablet Take 1 tablet (240 mg total) by mouth at bedtime. Please stop amlodipine. 90 tablet 3   gabapentin (NEURONTIN) 100 MG capsule Take 1-2 capsules (100-200 mg total) by mouth at bedtime. 180 capsule 1   Galcanezumab-gnlm (EMGALITY, 300 MG DOSE,) 100 MG/ML SOSY Inject 300 mg (3 pens) into the skin every 30 days during cluster headache cycle and stop when cycle is over. 3 mL 11   escitalopram (LEXAPRO) 5 MG tablet Take 1 tablet (5 mg total) by mouth at bedtime. (Patient not taking: Reported on 12/05/2023) 30 tablet 5   typhoid (VIVOTIF) DR capsule Take 1 capsule by mouth every other day. (Patient not taking: Reported on 12/05/2023) 4 capsule 0   diphenoxylate-atropine (LOMOTIL) 2.5-0.025 MG tablet Take 1-2 tablets by mouth 4 (four) times daily as needed for diarrhea or loose stools. (Patient not taking: Reported on 12/05/2023) 60 tablet 0   methocarbamol (ROBAXIN) 500 MG tablet Take 1 tablet (500 mg total) by mouth 4 (four) times daily as needed for muscle spasms. (Patient not taking: Reported on 03/19/2024) 120 tablet 11   No facility-administered medications prior to visit.    ROS: Review of Systems  Constitutional:  Negative for appetite change, fatigue and unexpected weight change.  HENT:  Positive for hearing loss. Negative for congestion, nosebleeds, sneezing, sore throat and trouble swallowing.   Eyes:  Positive for visual disturbance. Negative for itching.  Respiratory:  Negative for cough and shortness of breath.   Cardiovascular:  Negative for chest pain, palpitations and leg swelling.  Gastrointestinal:  Negative for abdominal distention, blood in stool, diarrhea and nausea.  Genitourinary:  Negative for frequency and hematuria.  Musculoskeletal:  Positive for back pain. Negative for gait problem, joint swelling and neck pain.  Skin:  Positive for color change. Negative for rash.  Neurological:  Negative for dizziness, tremors, speech difficulty and weakness.   Psychiatric/Behavioral:  Negative for agitation, dysphoric mood, sleep disturbance and suicidal ideas. The patient is not nervous/anxious.     Objective:  BP 118/78   Pulse 72   Temp 98.1 F (36.7 C)   Ht 5\' 5"  (1.651 m)   Wt 130 lb 3.2 oz (59.1 kg)   SpO2 95%   BMI 21.67 kg/m   BP Readings from Last 3 Encounters:  03/19/24 118/78  12/20/23 130/80  12/05/23 136/86    Wt Readings from Last 3 Encounters:  03/19/24 130 lb 3.2 oz (59.1 kg)  02/01/24 132 lb (59.9 kg)  12/20/23 132 lb (59.9 kg)    Physical Exam Constitutional:      General: He is not in acute distress.    Appearance: Normal appearance. He is well-developed.     Comments: NAD  HENT:     Right Ear: There is no impacted cerumen.     Left Ear: There is no impacted cerumen.  Eyes:     Conjunctiva/sclera: Conjunctivae normal.     Pupils: Pupils are equal, round, and reactive to light.  Neck:     Thyroid: No thyromegaly.     Vascular: No JVD.  Cardiovascular:     Rate and Rhythm: Normal rate and regular rhythm.     Heart sounds: Murmur heard.     No friction rub. No gallop.  Pulmonary:     Effort: Pulmonary effort is normal. No respiratory distress.     Breath sounds: Normal breath sounds. No wheezing or rales.  Chest:     Chest wall: No tenderness.  Abdominal:     General: Bowel sounds are normal. There is no distension.     Palpations: Abdomen is soft. There is no mass.     Tenderness: There is no abdominal tenderness. There is no guarding or rebound.  Musculoskeletal:        General: Tenderness present. Normal range of motion.     Cervical back: Normal range of motion.  Lymphadenopathy:     Cervical: No cervical adenopathy.  Skin:    General: Skin is warm and dry.     Findings: No rash.  Neurological:     Mental Status: He is alert and oriented to person, place, and time.     Cranial Nerves: No cranial nerve deficit.     Motor: No abnormal muscle tone.     Coordination: Coordination normal.      Gait: Gait normal.     Deep Tendon Reflexes: Reflexes are normal and symmetric.  Psychiatric:        Behavior: Behavior normal.        Thought Content: Thought content normal.        Judgment: Judgment normal.   LS spine w/pain  Lab Results  Component Value Date   WBC 7.0 12/20/2023   HGB 13.7 12/20/2023   HCT 41.1 12/20/2023   PLT 348.0 12/20/2023   GLUCOSE 103 (H) 12/20/2023   CHOL 166 12/20/2023   TRIG 77.0 12/20/2023   HDL 58.40 12/20/2023  LDLCALC 92 12/20/2023   ALT 15 12/20/2023   AST 22 12/20/2023   NA 139 12/20/2023   K 4.4 12/20/2023   CL 102 12/20/2023   CREATININE 1.70 (H) 12/20/2023   BUN 24 (H) 12/20/2023   CO2 28 12/20/2023   TSH 1.03 12/20/2023   PSA 0.88 12/20/2023   INR 1.01 09/03/2014   HGBA1C 6.0 12/20/2023   MICROALBUR 1.2 04/05/2023    MR BRAIN/IAC W WO CONTRAST Result Date: 07/31/2022 CLINICAL DATA:  Provided history: Hearing loss. Additional history provided by scanning technologist: Patient reports left greater than right hearing loss for 6 months. Blurred vision. EXAM: MRI HEAD WITHOUT AND WITH CONTRAST TECHNIQUE: Multiplanar, multiecho pulse sequences of the brain and surrounding structures were obtained without and with intravenous contrast. CONTRAST:  12mL MULTIHANCE GADOBENATE DIMEGLUMINE 529 MG/ML IV SOLN COMPARISON:  CT of the orbits 06/20/2021. Brain MRI 01/05/2021. FINDINGS: Intermittently motion degraded examination. Most notably, there is mild-to-moderate motion degradation of the axial high-resolution heavily T2 weighted sequence through the internal auditory canals and moderate motion degradation of the whole brain axial T1 weighted post-contrast sequence. Brain: Mild to moderate generalized parenchymal atrophy. Multifocal T2 FLAIR hyperintense signal abnormality within the cerebral white matter, nonspecific but compatible with mild chronic small vessel ischemic disease. No evidence of an intracranial mass. Specifically, no cerebellopontine  angle or internal auditory canal mass is demonstrated. Unremarkable appearance of the 7th and 8th cranial nerves bilaterally. There is no acute infarct. No chronic intracranial blood products. No extra-axial fluid collection. No midline shift. No pathologic intracranial enhancement identified. Vascular: Maintained flow voids within the proximal large arterial vessels. Skull and upper cervical spine: No focal suspicious marrow lesion. Incompletely assessed cervical spondylosis. Sinuses/Orbits: No mass or acute finding within the imaged orbits. Prior bilateral ocular lens replacement. Small mucous retention cysts within the bilateral maxillary sinuses. Trace mucosal thickening within the bilateral ethmoid sinuses. IMPRESSION: Intermittently motion degraded examination. No evidence of acute intracranial abnormality. No cerebellopontine angle or internal auditory canal mass. Mild chronic small vessel ischemic changes within the cerebral white matter, similar to the prior brain MRI of 01/05/2021. Mild-to-moderate generalized parenchymal atrophy. Mild paranasal sinus disease, as described. Electronically Signed   By: Jackey Loge D.O.   On: 07/31/2022 08:01    Assessment & Plan:   Problem List Items Addressed This Visit     Diabetes type 2, controlled (HCC) - Primary   Check A1c      Relevant Orders   Comprehensive metabolic panel   Hemoglobin A1c   Vitamin B 12 deficiency   Chronic Cont w/B12 injections      Hypertension   On Hytrin, Coreg, Losatan      Relevant Orders   Comprehensive metabolic panel   Hemoglobin A1c   Heart murmur   Dr Eden Emms       Relevant Orders   ECHOCARDIOGRAM COMPLETE      Meds ordered this encounter  Medications   gabapentin (NEURONTIN) 100 MG capsule    Sig: Take 3 capsules (300 mg total) by mouth at bedtime.    Dispense:  270 capsule    Refill:  2   methocarbamol (ROBAXIN) 500 MG tablet    Sig: Take 1 tablet (500 mg total) by mouth every 8 (eight) hours  as needed for muscle spasms.    Dispense:  90 tablet    Refill:  1   Galcanezumab-gnlm (EMGALITY, 300 MG DOSE,) 100 MG/ML SOSY    Sig: Inject 300 mg (3 pens) into the skin  every 30 days during cluster headache cycle and stop when cycle is over.      Follow-up: Return in about 3 months (around 06/19/2024) for a follow-up visit.  Sonda Primes, MD

## 2024-03-21 ENCOUNTER — Encounter: Payer: Self-pay | Admitting: Internal Medicine

## 2024-03-25 DIAGNOSIS — L121 Cicatricial pemphigoid: Secondary | ICD-10-CM | POA: Diagnosis not present

## 2024-03-25 DIAGNOSIS — H02051 Trichiasis without entropian right upper eyelid: Secondary | ICD-10-CM | POA: Diagnosis not present

## 2024-03-25 DIAGNOSIS — H02539 Eyelid retraction unspecified eye, unspecified lid: Secondary | ICD-10-CM | POA: Diagnosis not present

## 2024-03-25 DIAGNOSIS — H0279 Other degenerative disorders of eyelid and periocular area: Secondary | ICD-10-CM | POA: Diagnosis not present

## 2024-03-25 DIAGNOSIS — Z01818 Encounter for other preprocedural examination: Secondary | ICD-10-CM | POA: Diagnosis not present

## 2024-03-25 DIAGNOSIS — H02112 Cicatricial ectropion of right lower eyelid: Secondary | ICD-10-CM | POA: Diagnosis not present

## 2024-03-25 DIAGNOSIS — Z09 Encounter for follow-up examination after completed treatment for conditions other than malignant neoplasm: Secondary | ICD-10-CM | POA: Diagnosis not present

## 2024-03-25 DIAGNOSIS — H02115 Cicatricial ectropion of left lower eyelid: Secondary | ICD-10-CM | POA: Diagnosis not present

## 2024-03-25 DIAGNOSIS — H02001 Unspecified entropion of right upper eyelid: Secondary | ICD-10-CM | POA: Diagnosis not present

## 2024-04-09 ENCOUNTER — Other Ambulatory Visit: Payer: Self-pay

## 2024-04-09 ENCOUNTER — Other Ambulatory Visit: Payer: Self-pay | Admitting: Neurology

## 2024-04-09 DIAGNOSIS — Z79899 Other long term (current) drug therapy: Secondary | ICD-10-CM | POA: Diagnosis not present

## 2024-04-09 DIAGNOSIS — M316 Other giant cell arteritis: Secondary | ICD-10-CM | POA: Diagnosis not present

## 2024-04-09 DIAGNOSIS — L121 Cicatricial pemphigoid: Secondary | ICD-10-CM | POA: Diagnosis not present

## 2024-04-10 LAB — T + B-LYMPHOCYTE DIFFERENTIAL
% CD 3 Pos. Lymph.: 81.3 % (ref 57.5–86.2)
% CD 4 Pos. Lymph.: 29.7 % — ABNORMAL LOW (ref 30.8–58.5)
Absolute CD 3: 2439 /uL — ABNORMAL HIGH (ref 622–2402)
Absolute CD 4 Helper: 891 /uL (ref 359–1519)
Basophils Absolute: 0 10*3/uL (ref 0.0–0.2)
Basos: 1 %
CD19 % B Cell: 0.1 % — ABNORMAL LOW (ref 3.3–25.4)
CD19 Abs: 3 /uL — ABNORMAL LOW (ref 12–645)
CD4/CD8 Ratio: 0.57 — ABNORMAL LOW (ref 0.92–3.72)
CD8 % Suppressor T Cell: 52.5 % — ABNORMAL HIGH (ref 12.0–35.5)
CD8 T Cell Abs: 1575 /uL — ABNORMAL HIGH (ref 109–897)
EOS (ABSOLUTE): 0.2 10*3/uL (ref 0.0–0.4)
Eos: 2 %
Hematocrit: 41.9 % (ref 37.5–51.0)
Hemoglobin: 14.3 g/dL (ref 13.0–17.7)
Immature Grans (Abs): 0 10*3/uL (ref 0.0–0.1)
Immature Granulocytes: 0 %
Lymphocytes Absolute: 3 10*3/uL (ref 0.7–3.1)
Lymphs: 35 %
MCH: 30.6 pg (ref 26.6–33.0)
MCHC: 34.1 g/dL (ref 31.5–35.7)
MCV: 90 fL (ref 79–97)
Monocytes Absolute: 0.9 10*3/uL (ref 0.1–0.9)
Monocytes: 10 %
Neutrophils Absolute: 4.5 10*3/uL (ref 1.4–7.0)
Neutrophils: 52 %
Platelets: 419 10*3/uL (ref 150–450)
RBC: 4.68 x10E6/uL (ref 4.14–5.80)
RDW: 13.2 % (ref 11.6–15.4)
WBC: 8.6 10*3/uL (ref 3.4–10.8)

## 2024-04-15 DIAGNOSIS — Z860100 Personal history of colon polyps, unspecified: Secondary | ICD-10-CM | POA: Diagnosis not present

## 2024-04-15 DIAGNOSIS — R634 Abnormal weight loss: Secondary | ICD-10-CM | POA: Diagnosis not present

## 2024-04-15 DIAGNOSIS — R194 Change in bowel habit: Secondary | ICD-10-CM | POA: Diagnosis not present

## 2024-04-15 DIAGNOSIS — K5730 Diverticulosis of large intestine without perforation or abscess without bleeding: Secondary | ICD-10-CM | POA: Diagnosis not present

## 2024-04-15 DIAGNOSIS — Z1211 Encounter for screening for malignant neoplasm of colon: Secondary | ICD-10-CM | POA: Diagnosis not present

## 2024-04-17 ENCOUNTER — Ambulatory Visit (HOSPITAL_COMMUNITY): Attending: Internal Medicine

## 2024-04-17 DIAGNOSIS — R011 Cardiac murmur, unspecified: Secondary | ICD-10-CM | POA: Insufficient documentation

## 2024-04-17 LAB — ECHOCARDIOGRAM COMPLETE
AR max vel: 1.49 cm2
AV Area VTI: 1.75 cm2
AV Area mean vel: 1.62 cm2
AV Mean grad: 8.2 mmHg
AV Peak grad: 17.3 mmHg
Ao pk vel: 2.08 m/s
Area-P 1/2: 2.12 cm2
S' Lateral: 2.1 cm

## 2024-04-20 ENCOUNTER — Encounter: Payer: Self-pay | Admitting: Internal Medicine

## 2024-04-23 DIAGNOSIS — M316 Other giant cell arteritis: Secondary | ICD-10-CM | POA: Diagnosis not present

## 2024-04-23 DIAGNOSIS — L121 Cicatricial pemphigoid: Secondary | ICD-10-CM | POA: Diagnosis not present

## 2024-05-16 ENCOUNTER — Other Ambulatory Visit: Payer: Self-pay | Admitting: Internal Medicine

## 2024-05-26 DIAGNOSIS — H1089 Other conjunctivitis: Secondary | ICD-10-CM | POA: Diagnosis not present

## 2024-05-26 DIAGNOSIS — H11233 Symblepharon, bilateral: Secondary | ICD-10-CM | POA: Diagnosis not present

## 2024-06-10 ENCOUNTER — Ambulatory Visit: Payer: Medicare Other | Admitting: Neurology

## 2024-06-10 ENCOUNTER — Telehealth: Payer: Self-pay | Admitting: Neurology

## 2024-06-10 NOTE — Telephone Encounter (Signed)
 I have to be out of the office today from 2-3 would you call Dr. Janett Medin and ask if he wouldn;t mind coming in another day next week possibly? Please apologize for me. I can offer him an 1030am on June 26th (we can double book, I should be done with the emg/ncs by 1030) ir if that doesn;t work I am hapy to make him another appointment!

## 2024-06-10 NOTE — Telephone Encounter (Signed)
 Spoke with patient and rescheduled appointment for 06/19/24 at 10:30am

## 2024-06-18 DIAGNOSIS — H02051 Trichiasis without entropian right upper eyelid: Secondary | ICD-10-CM | POA: Diagnosis not present

## 2024-06-18 DIAGNOSIS — H11233 Symblepharon, bilateral: Secondary | ICD-10-CM | POA: Diagnosis not present

## 2024-06-18 DIAGNOSIS — H02115 Cicatricial ectropion of left lower eyelid: Secondary | ICD-10-CM | POA: Diagnosis not present

## 2024-06-18 DIAGNOSIS — H1789 Other corneal scars and opacities: Secondary | ICD-10-CM | POA: Diagnosis not present

## 2024-06-19 ENCOUNTER — Ambulatory Visit: Admitting: Neurology

## 2024-06-19 ENCOUNTER — Ambulatory Visit: Admitting: Internal Medicine

## 2024-06-19 ENCOUNTER — Telehealth: Payer: Self-pay

## 2024-06-19 ENCOUNTER — Encounter: Payer: Self-pay | Admitting: Neurology

## 2024-06-19 ENCOUNTER — Encounter: Payer: Self-pay | Admitting: Internal Medicine

## 2024-06-19 ENCOUNTER — Other Ambulatory Visit (HOSPITAL_COMMUNITY): Payer: Self-pay

## 2024-06-19 VITALS — BP 147/64 | HR 62 | Ht 65.0 in | Wt 126.8 lb

## 2024-06-19 VITALS — BP 118/68 | HR 80 | Temp 98.6°F | Ht 65.0 in | Wt 126.0 lb

## 2024-06-19 DIAGNOSIS — E559 Vitamin D deficiency, unspecified: Secondary | ICD-10-CM

## 2024-06-19 DIAGNOSIS — L121 Cicatricial pemphigoid: Secondary | ICD-10-CM

## 2024-06-19 DIAGNOSIS — E538 Deficiency of other specified B group vitamins: Secondary | ICD-10-CM

## 2024-06-19 DIAGNOSIS — I10 Essential (primary) hypertension: Secondary | ICD-10-CM | POA: Diagnosis not present

## 2024-06-19 DIAGNOSIS — N183 Chronic kidney disease, stage 3 unspecified: Secondary | ICD-10-CM

## 2024-06-19 DIAGNOSIS — R634 Abnormal weight loss: Secondary | ICD-10-CM

## 2024-06-19 DIAGNOSIS — E119 Type 2 diabetes mellitus without complications: Secondary | ICD-10-CM

## 2024-06-19 DIAGNOSIS — G8929 Other chronic pain: Secondary | ICD-10-CM | POA: Diagnosis not present

## 2024-06-19 DIAGNOSIS — M545 Low back pain, unspecified: Secondary | ICD-10-CM

## 2024-06-19 DIAGNOSIS — E1122 Type 2 diabetes mellitus with diabetic chronic kidney disease: Secondary | ICD-10-CM | POA: Diagnosis not present

## 2024-06-19 DIAGNOSIS — R194 Change in bowel habit: Secondary | ICD-10-CM | POA: Insufficient documentation

## 2024-06-19 DIAGNOSIS — G44019 Episodic cluster headache, not intractable: Secondary | ICD-10-CM

## 2024-06-19 LAB — COMPREHENSIVE METABOLIC PANEL WITH GFR
ALT: 17 U/L (ref 0–53)
AST: 27 U/L (ref 0–37)
Albumin: 4.2 g/dL (ref 3.5–5.2)
Alkaline Phosphatase: 85 U/L (ref 39–117)
BUN: 22 mg/dL (ref 6–23)
CO2: 30 meq/L (ref 19–32)
Calcium: 9.8 mg/dL (ref 8.4–10.5)
Chloride: 105 meq/L (ref 96–112)
Creatinine, Ser: 1.48 mg/dL (ref 0.40–1.50)
GFR: 43.88 mL/min — ABNORMAL LOW (ref >60.00)
Glucose, Bld: 162 mg/dL — ABNORMAL HIGH (ref 70–99)
Potassium: 4.1 meq/L (ref 3.5–5.1)
Sodium: 140 meq/L (ref 135–145)
Total Bilirubin: 0.4 mg/dL (ref 0.2–1.2)
Total Protein: 6.8 g/dL (ref 6.0–8.3)

## 2024-06-19 LAB — HEMOGLOBIN A1C: Hgb A1c MFr Bld: 6 % (ref 4.6–6.5)

## 2024-06-19 MED ORDER — HYDROCODONE-ACETAMINOPHEN 7.5-325 MG PO TABS: 1.0000 | ORAL_TABLET | Freq: Four times a day (QID) | ORAL | 0 refills | Status: AC | PRN

## 2024-06-19 MED ORDER — TADALAFIL 5 MG PO TABS: 5.0000 mg | ORAL_TABLET | Freq: Every day | ORAL | 11 refills | Status: AC

## 2024-06-19 MED ORDER — GABAPENTIN 100 MG PO CAPS: 100.0000 mg | ORAL_CAPSULE | Freq: Every day | ORAL | 1 refills | Status: AC

## 2024-06-19 NOTE — Assessment & Plan Note (Addendum)
 Reoccured  Pt never took Megace . Monitor wt at home. Eat well

## 2024-06-19 NOTE — Assessment & Plan Note (Signed)
 On Vit D

## 2024-06-19 NOTE — Assessment & Plan Note (Signed)
 Reoccured  Pt stopped Megace  - he was gaining wt.

## 2024-06-19 NOTE — Telephone Encounter (Signed)
 Pharmacy Patient Advocate Encounter   Received notification from CoverMyMeds that prior authorization for Tadalafil 5MG  tablets  is required/requested.   Insurance verification completed.   The patient is insured through [facility] .   Per test claim: PA required; PA started via CoverMyMeds. KEY [id number] . Waiting for clinical questions to populate.

## 2024-06-19 NOTE — Assessment & Plan Note (Signed)
Chronic Cont w/B12 injections

## 2024-06-19 NOTE — Progress Notes (Signed)
 Subjective:  Patient ID: Louis Kemp, male    DOB: 1941-02-28  Age: 83 y.o. MRN: [id number]  CC: Medical Management of Chronic Issues (3 mnth f/u)   HPI Louis Kemp presents for LBP - worse, HTN, DM C/o neuropathy  Outpatient Medications Prior to Visit  Medication Sig Dispense Refill   atovaquone -proguanil (MALARONE ) 250-100 MG TABS tablet Start 1 tab a day one day prior to your trip. Take 1 tablet daily while traveling. Continue 1 tablet daily for 1 week after retrun     Cholecalciferol  (VITAMIN D ) 2000 UNITS CAPS Take 1 capsule by mouth every other day.     cyanocobalamin  (VITAMIN B12) 1000 MCG/ML injection Inject 1 mL (1,000 mcg total) into the skin every 14 (fourteen) days. 10 mL 3   diphenoxylate -atropine  (LOMOTIL ) 2.5-0.025 MG tablet Take 1-2 tablets by mouth 4 (four) times daily as needed for diarrhea or loose stools.     escitalopram  (LEXAPRO ) 5 MG tablet Take 1 tablet (5 mg total) by mouth at bedtime. 30 tablet 5   fenofibrate  (TRICOR ) 145 MG tablet Take 1 tablet (145 mg total) by mouth daily. 90 tablet 3   fluorometholone (FML) 0.1 % ophthalmic suspension Apply 1 drop to eye.     Galcanezumab -gnlm (EMGALITY , 300 MG DOSE,) 100 MG/ML SOSY Inject 300 mg (3 pens) into the skin every 30 days during cluster headache cycle and stop when cycle is over.     loteprednol (LOTEMAX) 0.5 % ophthalmic suspension 1 drop at bedtime.     Melatonin 10 MG CAPS Take 10 capsules by mouth at bedtime. (Patient taking differently: Take 10 capsules by mouth as needed.)     metFORMIN  (GLUCOPHAGE ) 500 MG tablet Take 1 tablet (500 mg total) by mouth 3 (three) times daily. 270 tablet 3   methocarbamol  (ROBAXIN ) 500 MG tablet Take 1 tablet (500 mg total) by mouth every 8 (eight) hours as needed for muscle spasms. 90 tablet 1   prednisoLONE acetate (PRED FORTE) 1 % ophthalmic suspension 1 drop 4 (four) times daily. (Patient taking differently: 1 drop daily at 2 PM.)     predniSONE  (DELTASONE ) 10 MG tablet  Take by mouth.     PROLENSA 0.07 % SOLN Apply to eye.     riTUXimab  (RITUXAN) 500 MG/50ML injection Inject into the vein every 6 (six) months.     tamsulosin  (FLOMAX ) 0.4 MG CAPS capsule Take 1 capsule (0.4 mg total) by mouth daily. 90 capsule 3   traZODone  (DESYREL ) 50 MG tablet Take 0.5-1 tablets (25-50 mg total) by mouth at bedtime. 90 tablet 3   verapamil  (CALAN -SR) 240 MG CR tablet Take 1 tablet (240 mg total) by mouth at bedtime. Please stop amlodipine . 90 tablet 3   gabapentin  (NEURONTIN ) 100 MG capsule Take 3 capsules (300 mg total) by mouth at bedtime. (Patient taking differently: Take 100 mg by mouth at bedtime.) 270 capsule 2   riTUXimab -abbs (TRUXIMA ) 100 MG/10ML injection Inject into the vein. Injection 3 times a year (Patient not taking: Reported on 06/19/2024)     typhoid (VIVOTIF) DR capsule Take 1 capsule by mouth every other day. (Patient not taking: Reported on 06/19/2024) 4 capsule 0   No facility-administered medications prior to visit.    ROS: Review of Systems  Constitutional:  Positive for unexpected weight change. Negative for appetite change and fatigue.  HENT:  Negative for congestion, nosebleeds, sneezing, sore throat and trouble swallowing.   Eyes:  Positive for visual disturbance. Negative for itching.  Respiratory:  Negative  for cough.   Cardiovascular:  Negative for chest pain, palpitations and leg swelling.  Gastrointestinal:  Negative for abdominal distention, blood in stool, diarrhea and nausea.  Genitourinary:  Negative for frequency and hematuria.  Musculoskeletal:  Positive for back pain and gait problem. Negative for joint swelling and neck pain.  Skin:  Negative for rash.  Neurological:  Negative for dizziness, tremors, speech difficulty and weakness.  Hematological:  Does not bruise/bleed easily.  Psychiatric/Behavioral:  Negative for agitation, dysphoric mood, sleep disturbance and suicidal ideas. The patient is not nervous/anxious.     Objective:   BP 118/68   Pulse 80   Temp 98.6 F (37 C) (Oral)   Ht 5' 5 (1.651 m)   Wt 126 lb (57.2 kg)   SpO2 98%   BMI 20.97 kg/m   BP Readings from Last 3 Encounters:  06/19/24 118/68  06/19/24 (!) 147/64  03/19/24 118/78    Wt Readings from Last 3 Encounters:  06/19/24 126 lb (57.2 kg)  06/19/24 126 lb 12.8 oz (57.5 kg)  03/19/24 130 lb 3.2 oz (59.1 kg)    Physical Exam Constitutional:      General: He is not in acute distress.    Appearance: Normal appearance. He is well-developed.     Comments: NAD   Eyes:     Conjunctiva/sclera: Conjunctivae normal.     Pupils: Pupils are equal, round, and reactive to light.   Neck:     Thyroid : No thyromegaly.     Vascular: No JVD.   Cardiovascular:     Rate and Rhythm: Normal rate and regular rhythm.     Heart sounds: Normal heart sounds. No murmur heard.    No friction rub. No gallop.  Pulmonary:     Effort: Pulmonary effort is normal. No respiratory distress.     Breath sounds: Normal breath sounds. No wheezing or rales.  Chest:     Chest wall: No tenderness.  Abdominal:     General: Bowel sounds are normal. There is no distension.     Palpations: Abdomen is soft. There is no mass.     Tenderness: There is no abdominal tenderness. There is no guarding or rebound.   Musculoskeletal:        General: Tenderness present. Normal range of motion.     Cervical back: Normal range of motion.     Right lower leg: No edema.     Left lower leg: No edema.  Lymphadenopathy:     Cervical: No cervical adenopathy.   Skin:    General: Skin is warm and dry.     Findings: No rash.   Neurological:     Mental Status: He is alert and oriented to person, place, and time.     Cranial Nerves: No cranial nerve deficit.     Motor: No abnormal muscle tone.     Coordination: Coordination normal.     Gait: Gait normal.     Deep Tendon Reflexes: Reflexes are normal and symmetric.   Psychiatric:        Behavior: Behavior normal.         Thought Content: Thought content normal.        Judgment: Judgment normal.   Using a cane LS w/pain Thin  Lab Results  Component Value Date   WBC 8.6 04/09/2024   HGB 14.3 04/09/2024   HCT 41.9 04/09/2024   PLT 419 04/09/2024   GLUCOSE 110 (H) 03/19/2024   CHOL 166 12/20/2023   TRIG 77.0 12/20/2023  HDL 58.40 12/20/2023   LDLCALC 92 12/20/2023   ALT 14 03/19/2024   AST 20 03/19/2024   NA 139 03/19/2024   K 4.2 03/19/2024   CL 103 03/19/2024   CREATININE 1.46 03/19/2024   BUN 19 03/19/2024   CO2 27 03/19/2024   TSH 1.03 12/20/2023   PSA 0.88 12/20/2023   INR 1.01 09/03/2014   HGBA1C 6.0 03/19/2024   MICROALBUR 1.2 04/05/2023    MR BRAIN/IAC W WO CONTRAST Result Date: 07/31/2022 CLINICAL DATA:  Provided history: Hearing loss. Additional history provided by scanning technologist: Patient reports left greater than right hearing loss for 6 months. Blurred vision. EXAM: MRI HEAD WITHOUT AND WITH CONTRAST TECHNIQUE: Multiplanar, multiecho pulse sequences of the brain and surrounding structures were obtained without and with intravenous contrast. CONTRAST:  12mL MULTIHANCE  GADOBENATE DIMEGLUMINE  529 MG/ML IV SOLN COMPARISON:  CT of the orbits 06/20/2021. Brain MRI 01/05/2021. FINDINGS: Intermittently motion degraded examination. Most notably, there is mild-to-moderate motion degradation of the axial high-resolution heavily T2 weighted sequence through the internal auditory canals and moderate motion degradation of the whole brain axial T1 weighted post-contrast sequence. Brain: Mild to moderate generalized parenchymal atrophy. Multifocal T2 FLAIR hyperintense signal abnormality within the cerebral white matter, nonspecific but compatible with mild chronic small vessel ischemic disease. No evidence of an intracranial mass. Specifically, no cerebellopontine angle or internal auditory canal mass is demonstrated. Unremarkable appearance of the 7th and 8th cranial nerves bilaterally. There is no  acute infarct. No chronic intracranial blood products. No extra-axial fluid collection. No midline shift. No pathologic intracranial enhancement identified. Vascular: Maintained flow voids within the proximal large arterial vessels. Skull and upper cervical spine: No focal suspicious marrow lesion. Incompletely assessed cervical spondylosis. Sinuses/Orbits: No mass or acute finding within the imaged orbits. Prior bilateral ocular lens replacement. Small mucous retention cysts within the bilateral maxillary sinuses. Trace mucosal thickening within the bilateral ethmoid sinuses. IMPRESSION: Intermittently motion degraded examination. No evidence of acute intracranial abnormality. No cerebellopontine angle or internal auditory canal mass. Mild chronic small vessel ischemic changes within the cerebral white matter, similar to the prior brain MRI of 01/05/2021. Mild-to-moderate generalized parenchymal atrophy. Mild paranasal sinus disease, as described. Electronically Signed   By: Rockey Childs D.O.   On: 07/31/2022 08:01    Assessment & Plan:   Problem List Items Addressed This Visit     Diabetes type 2, controlled (HCC)   Check A1c      Relevant Orders   Comprehensive metabolic panel with GFR   Hemoglobin A1c   Vitamin B 12 deficiency - Primary   Chronic Cont w/B12 injections      Relevant Orders   Comprehensive metabolic panel with GFR   Hemoglobin A1c   Vitamin D  deficiency   On Vit D      Low back pain   Worse Norco prn Infrared heating pad Blue-Emu cream was recommended to use 2-3 times a day       Relevant Medications   predniSONE  (DELTASONE ) 10 MG tablet   HYDROcodone -acetaminophen  (NORCO) 7.5-325 MG tablet   Hypertension   On Hytrin , Coreg , Losatan      Relevant Medications   tadalafil (CIALIS) 5 MG tablet   Other Relevant Orders   Comprehensive metabolic panel with GFR   Hemoglobin A1c   Weight loss   Reoccured  Pt never took Megace . Monitor wt at home. Eat well       Relevant Orders   Comprehensive metabolic panel with GFR   Hemoglobin A1c  Chronic renal insufficiency, stage 3 (moderate) (HCC)   Monitoring GFR Hydrate well         Meds ordered this encounter  Medications   HYDROcodone -acetaminophen  (NORCO) 7.5-325 MG tablet    Sig: Take 1-2 tablets by mouth every 6 (six) hours as needed for moderate pain (pain score 4-6).    Dispense:  60 tablet    Refill:  0   tadalafil (CIALIS) 5 MG tablet    Sig: Take 1 tablet (5 mg total) by mouth daily.    Dispense:  30 tablet    Refill:  11   gabapentin  (NEURONTIN ) 100 MG capsule    Sig: Take 1 capsule (100 mg total) by mouth at bedtime.    Dispense:  90 capsule    Refill:  1      Follow-up: Return in about 3 months (around 09/19/2024) for a follow-up visit.  Marolyn Noel, MD

## 2024-06-19 NOTE — Assessment & Plan Note (Signed)
Monitoring GFR Hydrate well

## 2024-06-19 NOTE — Progress Notes (Signed)
 [facility]    Provider:  [name] Requesting Provider: Garald, Louis GAILS, MD Primary Care Provider:  Garald Louis GAILS, MD  CC:  right cluster headache and on rituximab  for Ocular cicatricial pemphigoid,   06/19/2024: No headaches, sometimes at night and it is mild, We can reduce the dose and taper off. Reduce to 500 rituximab . He took an injection 2 weeks ago go ahead and stop, increase to 6 weeks 1 injection. His [facility] physician would like him to continue the rituxin for life. 51-52 years of marriage and they may go away and so se if we can move the rituxin a few weeks earlier. Next is October 1st and 15th and his anniversary is the 11th will see if we can move up to sptember or push forward to nvember.   Patient complains of symptoms per HPI as well as the following symptoms: none . Pertinent negatives and positives per HPI. All others negative   12/05/2023: 83 y.o. male here as requested by Louis Kemp, Louis GAILS, [name], B12 deficiency, HTN, PAC, DM2. 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 Ultimately, patients affected by this autoimmune disease will experience conjunctival cicatrization or scarring.He may need to be on Rituximab  for life, has had a great response to rituximab , his vision is stable except as he gets closer to needigng his injection so still needs it q16months, vision is still poor but stable, doing great on injection unless getting close to his injection time which means he is still needing his injections every 4 months since his vision seems to start to wrosen if he does not  get it every 4 months, otherwise does fantastic.    he is doing well on the rituximab  no side effects his vision is better but worsens near the end of the infusion cycle which means he needs to continue for one more year  likely, the pain is better, lacrimation is better, we will continue Rituximab , it has improved his symptoms by >70% and has improved his quality of life by > 70%. We will continue to next 2 infusions and if still doing well an dnot wearing off by then, we will continue with 2 more infusions and then stop to see if he remains significantly improved.  He is being treated by [facility] and his Dr. Francella ophthalmologist left and being treated by Dr. Tobin.  Continue emgality  for cluster headaches.   Cluster headache: - Doing great on Emgality  continue for cluster headaches go back to 2 monthly Patient complains of symptoms per HPI as well as the following symptoms: none . Pertinent negatives and positives per HPI. All others negative   05/30/2023: Cluster headaches came back, increased to 2 emgality  and working.    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 Ultimately, patients affected by this autoimmune disease will experience conjunctival cicatrization or scarring.He may need to be on Rituximab  for life, has had a great response to rituximab , his vision is stable except as he gets closer to needigng his injection, vision is still poor but stable, mild lacrimation, stable vision unless getting close to his injection which means he is still needing his injections every 4 months since he seems to start to wrosen blurry vision if he does not, otherwise does fantastic on rituximab  life changing.  Per ophthalmologist at [facility] he is  to stay on rituximab  as they feel stopping now would worsen ocular pemphigoid, not on restasis, he has Cicatrizing conjunctivitis,  Ocular cicatricial pemphigoid, he is to continue the same therapy, he has some irritation or dryness but he uses drops every day prednisone  drops and tear drops, he does feel worse right before getting the infusion he start s more blurred vision right before  the rituximab  infusion. He has significant corneal scarring limiting vision from his condition which will worsen if the infusion is discontinued.  Continue rituximab  q4m. His hearing was impaired and CT scan did not show etiology/ENT and he has hearing aids. Left eye 20/200, right eye is 20/80. He is very positive. He wears a contact in the right eye.   He exercises on stationary bike. He feels a little fatigue but still exercises. Will forward all labs to Dr. Sura every 6 months.   He has chronic back pain and is going to [place] for 3 weeks can refill his opioid and robaxin  as needed he has taken them before  Reviewed notes, labs and imaging from outside physicians, which showed:  07/2022: EXAM: MRI HEAD WITHOUT AND WITH CONTRAST   TECHNIQUE: Multiplanar, multiecho pulse sequences of the brain and surrounding structures were obtained without and with intravenous contrast.   CONTRAST:  12mL MULTIHANCE  GADOBENATE DIMEGLUMINE  529 MG/ML IV SOLN   COMPARISON:  CT of the orbits 06/20/2021. Brain MRI 01/05/2021.   FINDINGS: Intermittently motion degraded examination. Most notably, there is mild-to-moderate motion degradation of the axial high-resolution heavily T2 weighted sequence through the internal auditory canals and moderate motion degradation of the whole brain axial T1 weighted post-contrast sequence.   Brain:   Mild to moderate generalized parenchymal atrophy.   Multifocal T2 FLAIR hyperintense signal abnormality within the cerebral white matter, nonspecific but compatible with mild chronic small vessel ischemic disease.   No evidence of an intracranial mass. Specifically, no cerebellopontine angle or internal auditory canal mass is demonstrated. Unremarkable appearance of the 7th and 8th cranial nerves bilaterally.   There is no acute infarct.   No chronic intracranial blood products.   No extra-axial fluid collection.   No midline shift.   No pathologic  intracranial enhancement identified.   Vascular: Maintained flow voids within the proximal large arterial vessels.   Skull and upper cervical spine: No focal suspicious marrow lesion. Incompletely assessed cervical spondylosis.   Sinuses/Orbits: No mass or acute finding within the imaged orbits. Prior bilateral ocular lens replacement. Small mucous retention cysts within the bilateral maxillary sinuses. Trace mucosal thickening within the bilateral ethmoid sinuses.   IMPRESSION: Intermittently motion degraded examination.   No evidence of acute intracranial abnormality.   No cerebellopontine angle or internal auditory canal mass.   Mild chronic small vessel ischemic changes within the cerebral white matter, similar to the prior brain MRI of 01/05/2021.   Mild-to-moderate generalized parenchymal atrophy.   Mild paranasal sinus disease, as described.    Dr. Aura ophthalmology's last note:   Current meds:  -Rituximab  q39m -PF 2/2 -HyloTear -Daily soft CTL  Previous:  -Restasis (allergic reaction) -Sclerals (did not tolerate, got RCE)  Assessment: Cicatrizing conjunctivitis, clinical suspicion for Ocular cicatricial pemphigoid -Quiet OU, good response to Rtx. Continue same therapy. -symptoms of irritation and dryness after YAG capsulotomy in 09/2019 Dr. Camillo -Conj biopsy 2/2: No diagnostic deposits were seen, only a strong linear band of fibrin at the basement membrane -Additionally, Quantiferon TB Positive (history of BCG vaccination)  Today 03/28/2023 S/p SK and BrightStar placement (  09/07/2022, [name]) Stable without epi defect Significant corneal scarring limiting vision Upper lid trichiasis today  Symblepharon of both eyes - 09/05/21: s/p right lower eyelid ectropion and right upper and lower punctoplasty with excision of caruncle and AMT with sutures, with right upper eyelid entropion repair, and left lower eyelid punctoplasty.  Plan: - pulled multiple  lashes out of RUL  - Continue PF 2/2 - Continue Losartan  4/4  - Continue rituximab  q4m (administered by Dr. Darleen) - discussed PRGF/serum tears  Finish losartan  then rx PRGF  Follow up in 4 months   Patient complains of symptoms per HPI as well as the following symptoms: back pain . Pertinent negatives and positives per HPI. All others negative  11/2022: He is significantly improved. No headaches, vision is better. He wakes up every 2 hours. Then it takes him time to sleep. He wakes really. Not taking naps. He goes to bed 930-10am. No snoring. Also difficulty initiating sleep.   He is improved, he had eye surgery on the cornea and he is feeling better with his vision. Light doesn't bother him anymore. They saw Dr. Francella yesterday. He has not had a headache in 3 months. He is having hearing problems, his left ear is worsening, I am not awareof any ototoxicity with rituximab  will ave him discuss with Dr. Francella at [facility]. His wife is here and provides information. He tried to go down to 2 emgality  and had a cluster headache, went back to 3.  Patient complains of symptoms per HPI as well as the following symptoms: left ear decreased hearing . Pertinent negatives and positives per HPI. All others negative   02/09/2022: Tremendous improvement on emgality  from daily severe multiple episodes of cluster headaches to 2x a month. Still having the headaches twice a month. It may last for 6 hours or 8 hours. We discussed options, continue taking Emgality , his vision is better slightly, he continues on Rituximab  in out office and we monitor his labs and send them to Dr. Francella ophthalmology at [facility]. We discussed acute management when he does have headaches, discussed options and decided: At onset of headache try Ubrelvy  with norco at onset of headache. Can repeat ubrelvy  2 hours later if needed Can also try injectable Zembrace with the norco: Take one injection every 15 minutes if needed max 4 in one  day  10/10/2021: He is doing great, just a few headaches, doing well on verapamil  and emgality , he had some corneal scarring and he had aprocedure which is helping. Follow up phone visit in 3 months  Interval history: Patient her etoday, he is feeling much better. We have him on verapamil  and have given him Emaglity cluster headache dose (today will be the second dose). Lidocaine  spray, oxygen, nerve block, sphenopalatine blocks did not last.   Interval history: Patient here for intractable episodic cluster headaches in the setting of very rare ocular pemphigoid.  He has intractable periorbital pain in the right with unilateral lacrimation and rhinorrhea.  Initially thought this might be a secondary headache from the very rare ocular pemphigoid, but at this time it appears he has concomitant cluster headaches; the rarity of ocular pemphigoid in addition with cluster headaches is quite unusual.  Today he has ptosis, likely from botox. Botox x 2 has not helped.  We have tried oral triptans, Nurtec, indomethacin , and none have relieved his symptoms.  Indomethacin  was titrated up to 150 mg a day does not appear to be an indomethacin  responsive headache but can try going  up to 225 mg a day.  We have tried him on Topamax , Zonegran , high-dose steroids for over a month.  We have discontinued his amlodipine  and carvedilol  and we started verapamil .  Today we also injected him with Emgality .  I performed nerve blocks as well as sphenopalatine ganglion block.  Our plan is to do the following:  Interval history: The patient describes a headache primarily as a constant boring pain.  Certainly, the possibility of hemicrania continua does need to be considered or Cluster headache.  A trial indomethacin (or verapamil  or Emgality ) may be of some diagnostic benefit. He did not tolerate topamax  but may have helped, neither tolerated zonegran , we have tried botox and will repeat, he declines depakote. Will titrate him off of  prenisone and try Indomethacin . If no benefit will try Emgality  or Verapamil .   Physially he is better, more energy, still having some redness of the right eye but maybe slightly improved, vision has not improved, Stopped the cellcept and on the rituximab .   Interval history: The pain is on the right temple, it happens in the afternoon or overnight about the same time every day, right eye waters(both eyes water), other wise headache wakes him up if he does not take medication, his blood pressure has been a little low he was feeling weak and it was 108 systolic. Taking the amlodipine  at 6pm, taking the carvedilol  6-7a, 10pm. BP 108/77 and tachycardic. He took gatorade yesterday and he was better but this morning again his BP was a little low. We decided to do nerve blocks today. We discussed topamax , CGRP meds, unfrotunately difficult to assess and treat unclear etiology of headaches - associated with his pemphigoid eye disease or is this an independent headache syndrome with associated autonomic symptoms? If no improvement with rituximab  this Thursday we will change course.   Interval history 01/24/2021: I spoke to patient's [facility] physician, we are going to try and get infusion here in the office. He also continues on Prednisone . Temple pain/headache is 85% better but he still has pain supraorbitally. Only at night or in the afternoon. But he is very improved. We discussed titrating the steroids and we will stay on 60mg  for one month then slowly decrease by 10mg  monthly. We discussed checking hgba1c and his glucose, I recommended he check his glucose daily, he is feeling well, no problems, his eyes feel much better and are not as red or painful. We received some notes from Dr. Francella, but no orders, I have contacted him to get specifics on the pre-treatment, the injection doses and any labwork he wants prior to infusions. He is taking omeprazole daily, taking steroids with food as well, no GI upset.   HPI:   Louis Kemp is a 83 y.o. male here as requested by Louis Kemp, Louis GAILS, MD for headaches. PMHx ocular pemphigoid, B12 deficiency, HTN, PAC, DM2. New onset right temporal headache, continuous, sever at times.  Headaches started 2 weeks ago, it wasn't that bad but it is worsening and now can be severe, it is in the temple and radiates to the temple and frontal area. It becomes quite severe 7-8/10. Sharp pain, shooting pain, he had prior headache in the left eye which was not similar it was ocular pain his eye was so inflamed due to the Pemphigoid. This is not the same, this is new, and is not anything he has ever experienced. He has noticed in the last 4-5 days when he eats he has jaw pain. The right  eye vision is worsening. No significant history of migraines. No other significant history of headaches, he had one migraine in the remote past and this is nothing like it. The pain is continuous, no known triggers. Alleve is not working. Had a long discussion with patient regarding possiibilites and differential. No other focal neurologic deficits, associated symptoms, inciting events or modifiable factors.  Reviewed notes, labs and imaging from outside physicians, which showed:  Personally reviewed imaging with Dr. Eather Mulvihill(Neuroradiology) and agree with the following:mild T2 hyperintensities, mild cerebral atrophy, otherwise unremarkable for age.   11/10/2020: TSH nml, B12 1056, cbc unremarkable, CMP elevated glucose and reduced GFR 52 otherwise normal Review of Systems: Patient complains of symptoms per HPI as well as the following symptoms: headache, vision changes . Pertinent negatives and positives per HPI. All others negative    Social History   Socioeconomic History   Marital status: Married    Spouse name: [name]   Number of children: Not on file   Years of education: Not on file   Highest education level: Not on file  Occupational History   Occupation: MD Retired  Tobacco Use    Smoking status: Former    Types: Cigars   Smokeless tobacco: Never   Tobacco comments:    3-4 cigars per year in the past but none in the past 10 years  Vaping Use   Vaping status: Never Used  Substance and Sexual Activity   Alcohol use: Not Currently    Alcohol/week: 0.0 - 1.0 standard drinks of alcohol    Comment: maybe 1 mixed drink maybe once a month   Drug use: No   Sexual activity: Not Currently  Other Topics Concern   Not on file  Social History Narrative   Regular Exercise -  YES, golf   Lives at home with wife    Right handed   Caffeine :  1-2 cups/day      Lives with wife-2025   Retired    Social Drivers of Corporate investment banker Strain: Low Risk  (02/01/2024)   Overall Financial Resource Strain (CARDIA)    Difficulty of Paying Living Expenses: Not hard at all  Food Insecurity: No Food Insecurity (02/01/2024)   Hunger Vital Sign    Worried About Running Out of Food in the Last Year: Never true    Ran Out of Food in the Last Year: Never true  Transportation Needs: No Transportation Needs (02/01/2024)   PRAPARE - Administrator, Civil Service (Medical): No    Lack of Transportation (Non-Medical): No  Physical Activity: Insufficiently Active (02/01/2024)   Exercise Vital Sign    Days of Exercise per Week: 4 days    Minutes of Exercise per Session: 30 min  Stress: No Stress Concern Present (02/01/2024)   [employer] of Occupational Health - Occupational Stress Questionnaire    Feeling of Stress : Not at all  Social Connections: Moderately Isolated (02/01/2024)   Social Connection and Isolation Panel    Frequency of Communication with Friends and Family: More than three times a week    Frequency of Social Gatherings with Friends and Family: Not on file    Attends Religious Services: Never    Active Member of Clubs or Organizations: No    Attends Banker Meetings: Never    Marital Status: Married  Catering manager Violence: Not At Risk  (02/01/2024)   Humiliation, Afraid, Rape, and Kick questionnaire    Fear of Current or Ex-Partner: No  Emotionally Abused: No    Physically Abused: No    Sexually Abused: No    Family History  Problem Relation Age of Onset   Arthritis Mother    Hypertension Father    Coronary artery disease Father    Hypertension Brother    Hypertension Other    Hypertension Brother    Coronary artery disease Other     Past Medical History:  Diagnosis Date   BPH (benign prostatic hypertrophy)    Hyperlipidemia    Hypertension    LBP (low back pain)    MVP (mitral valve prolapse)    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   on cellcept   Osteoarthritis    PAC (premature atrial contraction)    Statin intolerance    Type II or unspecified type diabetes mellitus without mention of complication, not stated as uncontrolled 2011   Vitamin B 12 deficiency 2011   Vitamin D  deficiency    Vitiligo     Patient Active Problem List   Diagnosis Date Noted   Change in bowel habit 06/19/2024   Neuropathy 12/20/2023   Insomnia disorder 12/20/2023   Nocturia 12/20/2023   Cicatricial ocular pemphigoid 05/30/2023   Travel advice encounter 04/05/2023   Diverticular disease of colon 01/04/2023   Gastroesophageal reflux disease 01/04/2023   Iron deficiency anemia 01/04/2023   Loss of appetite 01/04/2023   History of colonic polyps 01/04/2023   Long-term use of high-risk medication 12/03/2022   Asymmetric SNHL (sensorineural hearing loss) 07/14/2022   Presbycusis of both ears 07/14/2022   Hearing loss 05/31/2022   Pseudophakia of both eyes 07/12/2021   Malnutrition (HCC) 06/23/2021   Episodic cluster headache, not intractable 06/09/2021   Ocular pemphigoid 05/19/2021   Chronic sinusitis 05/12/2021   Depression 05/12/2021   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   Right sided temporal headache 01/06/2021   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   Chronic renal insufficiency, stage 3  (moderate) (HCC) 05/11/2020   Pemphigoid 05/10/2020   Elevated serum creatinine 07/11/2018   Hip pain, acute, right 01/10/2018   Postoperative anemia due to acute blood loss 09/30/2014   Status post left partial knee replacement 09/15/2014   S/P left UKR 09/14/2014   S/P right TKA 06/15/2014   Osteoarthritis, knee 11/24/2013   Weight loss 06/24/2013   PAC (premature atrial contraction) 01/13/2013   Heart murmur 01/13/2013   Well adult exam 11/03/2012   Paresthesia 10/22/2012   Hypertension 04/18/2012   Diabetes type 2, controlled (HCC) 10/20/2010   Vitamin B 12 deficiency 10/20/2010   Vitamin D  deficiency 10/20/2010   Dyslipidemia 07/14/2010   BPH (benign prostatic hypertrophy) 07/14/2010   OSTEOARTHRITIS 07/14/2010   Low back pain 07/14/2010   HYPERGLYCEMIA 07/14/2010    Past Surgical History:  Procedure Laterality Date   arm surgery Left    ORIF left  forearm   CATARACT EXTRACTION  2012   EYE SURGERY     eye surgery Right 05/02/2022   remove scar on cornea and then put amniotic cells on it   eyelid surgery Right    HERNIA REPAIR Bilateral 1971   PARTIAL KNEE ARTHROPLASTY Left 09/14/2014   Procedure: LEFT UNICOMPARTMENTAL KNEE ARTHROPLASTY MEDIALLY;  Surgeon: Donnice JONETTA Car, MD;  Location: [facility];  Service: Orthopedics;  Laterality: Left;   TOTAL KNEE ARTHROPLASTY Right 06/15/2014   Procedure: RIGHT TOTAL KNEE ARTHROPLASTY;  Surgeon: Donnice JONETTA Car, MD;  Location: [facility];  Service: Orthopedics;  Laterality: Right;    Current Outpatient Medications  Medication Sig Dispense Refill  Cholecalciferol  (VITAMIN D ) 2000 UNITS CAPS Take 1 capsule by mouth every other day.     cyanocobalamin  (VITAMIN B12) 1000 MCG/ML injection Inject 1 mL (1,000 mcg total) into the skin every 14 (fourteen) days. 10 mL 3   Galcanezumab -gnlm (EMGALITY , 300 MG DOSE,) 100 MG/ML SOSY Inject 300 mg (3 pens) into the skin every 30 days during cluster headache cycle and stop when cycle is over.      Melatonin 10 MG CAPS Take 10 capsules by mouth at bedtime. (Patient taking differently: Take 10 capsules by mouth as needed.)     metFORMIN  (GLUCOPHAGE ) 500 MG tablet Take 1 tablet (500 mg total) by mouth 3 (three) times daily. 270 tablet 3   methocarbamol  (ROBAXIN ) 500 MG tablet Take 1 tablet (500 mg total) by mouth every 8 (eight) hours as needed for muscle spasms. 90 tablet 1   prednisoLONE acetate (PRED FORTE) 1 % ophthalmic suspension 1 drop 4 (four) times daily. (Patient taking differently: 1 drop daily at 2 PM.)     PROLENSA 0.07 % SOLN Apply to eye.     riTUXimab  (RITUXAN) 500 MG/50ML injection Inject into the vein every 6 (six) months.     tamsulosin  (FLOMAX ) 0.4 MG CAPS capsule Take 1 capsule (0.4 mg total) by mouth daily. 90 capsule 3   traZODone  (DESYREL ) 50 MG tablet Take 0.5-1 tablets (25-50 mg total) by mouth at bedtime. 90 tablet 3   verapamil  (CALAN -SR) 240 MG CR tablet Take 1 tablet (240 mg total) by mouth at bedtime. Please stop amlodipine . 90 tablet 3   atovaquone -proguanil (MALARONE ) 250-100 MG TABS tablet Start 1 tab a day one day prior to your trip. Take 1 tablet daily while traveling. Continue 1 tablet daily for 1 week after retrun     diphenoxylate -atropine  (LOMOTIL ) 2.5-0.025 MG tablet Take 1-2 tablets by mouth 4 (four) times daily as needed for diarrhea or loose stools.     escitalopram  (LEXAPRO ) 5 MG tablet Take 1 tablet (5 mg total) by mouth at bedtime. 30 tablet 5   fenofibrate  (TRICOR ) 145 MG tablet Take 1 tablet (145 mg total) by mouth daily. 90 tablet 3   fluorometholone (FML) 0.1 % ophthalmic suspension Apply 1 drop to eye.     gabapentin  (NEURONTIN ) 100 MG capsule Take 1 capsule (100 mg total) by mouth at bedtime. 90 capsule 1   HYDROcodone -acetaminophen  (NORCO) 7.5-325 MG tablet Take 1-2 tablets by mouth every 6 (six) hours as needed for moderate pain (pain score 4-6). 60 tablet 0   loteprednol (LOTEMAX) 0.5 % ophthalmic suspension 1 drop at bedtime.      predniSONE  (DELTASONE ) 10 MG tablet Take by mouth.     riTUXimab -abbs (TRUXIMA ) 100 MG/10ML injection Inject into the vein. Injection 3 times a year (Patient not taking: Reported on 06/19/2024)     tadalafil  (CIALIS ) 5 MG tablet Take 1 tablet (5 mg total) by mouth daily. 30 tablet 11   typhoid (VIVOTIF) DR capsule Take 1 capsule by mouth every other day. (Patient not taking: Reported on 06/19/2024) 4 capsule 0   No current facility-administered medications for this visit.    Allergies as of 06/19/2024 - Review Complete 06/19/2024  Allergen Reaction Noted   Statins  06/23/2021   Atorvastatin  10/20/2010   Rosuvastatin  10/20/2010   Zonisamide   05/12/2021    Vitals: BP (!) 147/64   Pulse 62   Ht 5' 5 (1.651 m)   Wt 126 lb 12.8 oz (57.5 kg)   BMI 21.10 kg/m  Last  Weight:  Wt Readings from Last 1 Encounters:  06/19/24 126 lb (57.2 kg)   Last Height:   Ht Readings from Last 1 Encounters:  06/19/24 5' 5 (1.651 m)   Physical exam: Exam: Gen: NAD, conversant      CV: No palpitations or chest pain or SOB. VS: Breathing at a normal rate. Weight normal. Not febrile. Eyes: Conjunctivae clear without exudates or hemorrhage  Neuro: Detailed Neurologic Exam  Speech:    Speech is normal; fluent and spontaneous with normal comprehension.  Cognition:    The patient is oriented to person, place, and time;     recent and remote memory intact;     language fluent;     normal attention, concentration, fund of knowledge Cranial Nerves:    The pupils are equal, round, and reactive to light. Visual fields are full Extraocular movements are intact.  The face is symmetric with normal sensation. The palate elevates in the midline. Hearing intact. Voice is normal. Shoulder shrug is normal. The tongue has normal motion without fasciculations.   Coordination: normal  Gait:    No abnormalities noted or reported  Motor Observation:   no involuntary movements noted. Tone:    Appears  normal  Posture:    Posture is normal. normal erect    Strength:    Strength is anti-gravity and symmetric in the upper and lower limbs.      Sensation: intact to LT, no reports of numbness or tingling or paresthesias        Assessment/Plan:   Absoltely LOVELY 83 y.o. ophthalmologist here as requested by Louis Kemp, Louis GAILS, [name], B12 deficiency, HTN, PAC, DM2. 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 Ultimately, patients affected by this autoimmune disease will experience conjunctival cicatrization or scarring.He may need to be on Rituximab  for life, has had a great response to rituximab , his vision is stable except as he gets closer to needigng his injection so still needs it q35months, vision is still poor but stable, doing great on injection unless getting close to his injection time which means he is still needing his injections every 4 months since his vision seems to start to wrosen if he does not  get it every 4 months, otherwise does fantastic.    he is doing well on the rituximab  no side effects his vision is better but worsens near the end of the infusion cycle which means he needs to continue, his [facility] physician has advised him to continue indefinitely but we discussed decreasing the dose as he is doing well and also extending the emgality  out 6 weeks to see if the cluster/migrianes have resolved also with improvement of his condition on rituximab , the pain is better, lacrimation is better, we will continue Rituximab , it has improved his symptoms by >70% and has improved his quality of life by > 70%. We will continue to next infusion with lower dose.  He is being treated by [facility] and his Dr. Francella ophthalmologist left and being treated by Dr. Tobin.  Continue emgality    Cluster headache/migraines: - Doing great on Emgality   continue for cluster headaches go back to 2 monthly  Ocular cicatricial pemphigoid (OCP)  - Continue: Rituximab  and continue to follow with [facility]. For ocular pemphigoid. 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 Ultimately, patients affected by this autoimmune disease will experience conjunctival cicatrization or scarring.He may need to be on Rituximab  for life, Continue rituximab  q19m.  - Blood test prior to infusion next one is 26th June, continue every 4 months  Chronic back pain - Opioid and robaxin  for back pain  PRIOR ASSESSMENT AND PLANS: Better with verapamil  and Emgality . Now on Verapamil  240mg  a day (cannot tolerate higher) for cluster headache dosage. Appears to be responding to cluster headache treatment excellent. Tremendous improvement on emgality  from daily severe multiple episodes of cluster headaches to 2x a month. Still having the headaches twice a month. It may last for 6 hours or 8 hours.  We discussed acute management when he does have headaches, discussed options and decided: At onset of headache try Ubrelvy  with norco at onset of headache. Can repeat ubrelvy  2 hours later if needed, Can also try injectable Zembrace with the norco: Take one injection every 15 minutes if needed max 4 in one day gave samples  Nerve blocks, sphenopalatine blocks,Indomethacin , lidocaine  nasal spray did not help. He had side effects to imitrex . He did not tolerate topamax  but may have helped, neither tolerated zonegran . Botox did not help, nurtec did not help, he declined depakote, , high dose prednisone  did not help significantly. has resolved ptosis from last botox injections. oxygen 15 L high flow for acute events did not help, will  cancel order  MRI of the brain was negative as were carotid dopplers.  Ordered CT of the orbits which showed swelling underneath the right eyelid suggestive of orbital cellulitis,finished Keflex  500mg  bid start 7 days still swollen  Continue: Rituximab  and continue to follow with [facility]. For ocular pemphigoid   PRIOR Assessment and plan:   I spoke to patient's [facility] physician, we are going to try and get infusion here in the office. He also continues on Prednisone  60mg . Temple pain/headache is 85% better but he still has pain supraorbitally however only at night or in the afternoon. But he is very improved. We discussed titrating the steroids and we will stay on 60mg  for one month then slowly decrease by 10mg  monthly. We discussed checking hgba1c and his glucose, I recommended he check his glucose daily, he is feeling well, no problems, his eyes feel much better and are not as red or painful. We received some notes from Dr. Francella, but no orders, I have contacted him to get specifics on Rituximab  including the pre-treatment, the injection doses and any labwork he wants prior to infusions. He is taking omeprazole daily, taking steroids with food as well, no GI upset.  Unclear etiology of headache but it has to be inflammatory/vascular or somehow related to the ocular pemphigoid, I doubt he would develop migraines or cluster headache at the age of 31 but trigeminal autonomic cephalalgias in the differential. Still we spoke about trying Depakote or other migraine medication(I do not think this is a migraine but it still may benefit from these medications). We decided to stay with the prednisone  as it is helping after only 2 weeks and can consider other medications if needed (he is not enthusiastic about topamax  or gabapentin , nurtec helped a little, wonder if qulipta would be helpful or depakote)  PRIOR Patient is on cellcept so I would think Temporal Arteritis(GCA) less likely however he has a  new right temporal headache (he has no significant headache history) over  the last 2 weeks with jaw symptoms and worsening vision. I am going to treat him for GCA and order esr/crp however not sure they will be reliable. Will order Ultra Sound of the temporal arteries, if esr/crp elevated we can discuss a biopsy but may be elevated due to his current inflammatory eye disease. I have contacted his ocular immunologist Dr. Francella.   (Addendum: ESR 31(ULN 30), CRP 13(ULN 10), temporal artery ultrasound without evidence of halo sign)  Steffan Francella, MD ocular immunologist [phone]        Cc: Louis Kemp, Louis GAILS, MD,  Steffan Francella, MD ocular immunologist [phone]  Onetha Epp, MD  [facility] [address] [place], [place] [address]  I spent 14 minutes of face-to-face and non-face-to-face time with patient on the  1. Episodic cluster headache, not intractable   2. Cicatricial ocular pemphigoid     diagnosis.  This included previsit chart review, lab review, study review, order entry, electronic health record documentation, patient education on the different diagnostic and therapeutic options, counseling and coordination of care, risks and benefits of management, compliance, or risk factor reduction

## 2024-06-19 NOTE — Assessment & Plan Note (Signed)
 Check A1c.

## 2024-06-19 NOTE — Patient Instructions (Signed)
 USEFUL THINGS FOR ARTHRITIS and musculoskeletal pains:    A rice sock heating pad refers to a homemade heating pad created by filling a sock with uncooked rice, which can be heated in a microwave to provide a warm compress for sore muscles, pain relief, or other applications; essentially, it's a simple way to generate heat using readily available materials.  Key points about rice sock heat: How to make it: Fill a clean sock (preferably a tube sock) about 2/3 full with uncooked rice, tie a knot at the top to secure the rice inside.  Heating it up: Place the rice sock in the microwave and heat in short intervals (usually around 30 seconds at a time) until it reaches the desired warmth.  Important considerations: Check temperature before applying: Always test the temperature of the rice sock before applying it to your skin to avoid burns.  Use a towel to protect skin: Wrap the rice sock in a thin towel to distribute the heat evenly and protect your skin.  Uses: Muscle aches and pains  Menstrual cramps  Neck pain  Arthritis discomfort     BLUE EMU CREAM: Use it 2-3 times a day on painful areas     Infrared heating pad

## 2024-06-19 NOTE — Assessment & Plan Note (Signed)
On Hytrin, Coreg, Losatan

## 2024-06-19 NOTE — Assessment & Plan Note (Signed)
 Worse Norco prn Infrared heating pad Blue-Emu cream was recommended to use 2-3 times a day

## 2024-06-20 ENCOUNTER — Ambulatory Visit: Payer: Self-pay | Admitting: Internal Medicine

## 2024-06-20 NOTE — Telephone Encounter (Signed)
 PLEASE BE ADVISED Clinical questions have been answered and PA submitted.TO PLAN. PA currently Pending.

## 2024-06-23 NOTE — Telephone Encounter (Signed)
 Pharmacy Patient Advocate Encounter  Received notification from [employer] that Prior Authorization for Tadalafil  5MG  tablets has been APPROVED from 06/20/2024 to 12/24/2024 SEE OUTCOME BELOW Request Reference Number: [id number]. TADALAFIL  TAB 5MG  is APPROVED through 12/24/2024. Your patient may now fill this prescription and it will be covered. Effective Date: 06/20/2024 Authorization Expiration Date: 12/24/2024

## 2024-07-21 DIAGNOSIS — Z1211 Encounter for screening for malignant neoplasm of colon: Secondary | ICD-10-CM | POA: Diagnosis not present

## 2024-07-21 DIAGNOSIS — Z860102 Personal history of hyperplastic colon polyps: Secondary | ICD-10-CM | POA: Diagnosis not present

## 2024-07-21 DIAGNOSIS — R634 Abnormal weight loss: Secondary | ICD-10-CM | POA: Diagnosis not present

## 2024-07-21 DIAGNOSIS — K5730 Diverticulosis of large intestine without perforation or abscess without bleeding: Secondary | ICD-10-CM | POA: Diagnosis not present

## 2024-07-21 DIAGNOSIS — K558 Other vascular disorders of intestine: Secondary | ICD-10-CM | POA: Diagnosis not present

## 2024-07-21 DIAGNOSIS — R194 Change in bowel habit: Secondary | ICD-10-CM | POA: Diagnosis not present

## 2024-09-25 ENCOUNTER — Encounter: Payer: Self-pay | Admitting: Internal Medicine

## 2024-09-25 ENCOUNTER — Ambulatory Visit: Admitting: Internal Medicine

## 2024-09-25 VITALS — BP 150/82 | HR 54 | Temp 98.5°F | Ht 67.0 in | Wt 123.4 lb

## 2024-09-25 DIAGNOSIS — E559 Vitamin D deficiency, unspecified: Secondary | ICD-10-CM | POA: Diagnosis not present

## 2024-09-25 DIAGNOSIS — I10 Essential (primary) hypertension: Secondary | ICD-10-CM | POA: Diagnosis not present

## 2024-09-25 DIAGNOSIS — N320 Bladder-neck obstruction: Secondary | ICD-10-CM

## 2024-09-25 DIAGNOSIS — G4709 Other insomnia: Secondary | ICD-10-CM | POA: Diagnosis not present

## 2024-09-25 DIAGNOSIS — E119 Type 2 diabetes mellitus without complications: Secondary | ICD-10-CM

## 2024-09-25 DIAGNOSIS — E785 Hyperlipidemia, unspecified: Secondary | ICD-10-CM

## 2024-09-25 DIAGNOSIS — E538 Deficiency of other specified B group vitamins: Secondary | ICD-10-CM | POA: Diagnosis not present

## 2024-09-25 LAB — CBC WITH DIFFERENTIAL/PLATELET
Basophils Absolute: 0 K/uL (ref 0.0–0.1)
Basophils Relative: 0.6 % (ref 0.0–3.0)
Eosinophils Absolute: 0.1 K/uL (ref 0.0–0.7)
Eosinophils Relative: 0.9 % (ref 0.0–5.0)
HCT: 37.6 % — ABNORMAL LOW (ref 39.0–52.0)
Hemoglobin: 12.7 g/dL — ABNORMAL LOW (ref 13.0–17.0)
Lymphocytes Relative: 32.4 % (ref 12.0–46.0)
Lymphs Abs: 2.2 K/uL (ref 0.7–4.0)
MCHC: 33.7 g/dL (ref 30.0–36.0)
MCV: 90.1 fl (ref 78.0–100.0)
Monocytes Absolute: 0.6 K/uL (ref 0.1–1.0)
Monocytes Relative: 9.1 % (ref 3.0–12.0)
Neutro Abs: 3.8 K/uL (ref 1.4–7.7)
Neutrophils Relative %: 57 % (ref 43.0–77.0)
Platelets: 382 K/uL (ref 150.0–400.0)
RBC: 4.18 Mil/uL — ABNORMAL LOW (ref 4.22–5.81)
RDW: 14.4 % (ref 11.5–15.5)
WBC: 6.7 K/uL (ref 4.0–10.5)

## 2024-09-25 LAB — PSA: PSA: 0.82 ng/mL (ref 0.10–4.00)

## 2024-09-25 LAB — LIPID PANEL
Cholesterol: 171 mg/dL (ref 0–200)
HDL: 57.9 mg/dL (ref >39.00)
LDL Cholesterol: 98 mg/dL (ref 0–99)
NonHDL: 113.49
Total CHOL/HDL Ratio: 3
Triglycerides: 79 mg/dL (ref 0.0–149.0)
VLDL: 15.8 mg/dL (ref 0.0–40.0)

## 2024-09-25 LAB — URINALYSIS
Bilirubin Urine: NEGATIVE
Hgb urine dipstick: NEGATIVE
Ketones, ur: NEGATIVE
Leukocytes,Ua: NEGATIVE
Nitrite: NEGATIVE
Specific Gravity, Urine: 1.02 (ref 1.000–1.030)
Total Protein, Urine: NEGATIVE
Urine Glucose: NEGATIVE
Urobilinogen, UA: 0.2 (ref 0.0–1.0)
pH: 6 (ref 5.0–8.0)

## 2024-09-25 LAB — COMPREHENSIVE METABOLIC PANEL WITH GFR
ALT: 16 U/L (ref 0–53)
AST: 26 U/L (ref 0–37)
Albumin: 4.5 g/dL (ref 3.5–5.2)
Alkaline Phosphatase: 79 U/L (ref 39–117)
BUN: 18 mg/dL (ref 6–23)
CO2: 28 meq/L (ref 19–32)
Calcium: 9.8 mg/dL (ref 8.4–10.5)
Chloride: 102 meq/L (ref 96–112)
Creatinine, Ser: 1.42 mg/dL (ref 0.40–1.50)
GFR: 46.03 mL/min — ABNORMAL LOW (ref >60.00)
Glucose, Bld: 86 mg/dL (ref 70–99)
Potassium: 4.2 meq/L (ref 3.5–5.1)
Sodium: 138 meq/L (ref 135–145)
Total Bilirubin: 0.4 mg/dL (ref 0.2–1.2)
Total Protein: 7 g/dL (ref 6.0–8.3)

## 2024-09-25 LAB — MICROALBUMIN / CREATININE URINE RATIO
Creatinine,U: 97.4 mg/dL
Microalb Creat Ratio: 20.6 mg/g (ref 0.0–30.0)
Microalb, Ur: 2 mg/dL — ABNORMAL HIGH (ref 0.0–1.9)

## 2024-09-25 LAB — T4, FREE: Free T4: 0.87 ng/dL (ref 0.60–1.60)

## 2024-09-25 LAB — HEMOGLOBIN A1C: Hgb A1c MFr Bld: 6 % (ref 4.6–6.5)

## 2024-09-25 LAB — TSH: TSH: 1.07 u[IU]/mL (ref 0.35–5.50)

## 2024-09-25 MED ORDER — TRAZODONE HCL 50 MG PO TABS: 25.0000 mg | ORAL_TABLET | Freq: Every day | ORAL | 3 refills | Status: AC

## 2024-09-25 NOTE — Assessment & Plan Note (Signed)
 Nl BP at home On Hytrin , Coreg , [place]

## 2024-09-25 NOTE — Progress Notes (Signed)
 Subjective:  Patient ID: Louis Kemp, male    DOB: 02/23/1941  Age: 83 y.o. MRN: [id number]  CC: Follow-up (Patient states nothing to discuss. Patient wife wants to know why he is wasting so much weight. )   HPI Louis Kemp presents for DM, HTN, dyslipidemia BP nl at home  Outpatient Medications Prior to Visit  Medication Sig Dispense Refill   atovaquone -proguanil (MALARONE ) 250-100 MG TABS tablet Start 1 tab a day one day prior to your trip. Take 1 tablet daily while traveling. Continue 1 tablet daily for 1 week after retrun     Cholecalciferol  (VITAMIN D ) 2000 UNITS CAPS Take 1 capsule by mouth every other day.     cyanocobalamin  (VITAMIN B12) 1000 MCG/ML injection Inject 1 mL (1,000 mcg total) into the skin every 14 (fourteen) days. 10 mL 3   diphenoxylate -atropine  (LOMOTIL ) 2.5-0.025 MG tablet Take 1-2 tablets by mouth 4 (four) times daily as needed for diarrhea or loose stools.     escitalopram  (LEXAPRO ) 5 MG tablet Take 1 tablet (5 mg total) by mouth at bedtime. 30 tablet 5   fenofibrate  (TRICOR ) 145 MG tablet Take 1 tablet (145 mg total) by mouth daily. 90 tablet 3   fluorometholone (FML) 0.1 % ophthalmic suspension Apply 1 drop to eye.     gabapentin  (NEURONTIN ) 100 MG capsule Take 1 capsule (100 mg total) by mouth at bedtime. 90 capsule 1   Galcanezumab -gnlm (EMGALITY , 300 MG DOSE,) 100 MG/ML SOSY Inject 300 mg (3 pens) into the skin every 30 days during cluster headache cycle and stop when cycle is over.     HYDROcodone -acetaminophen  (NORCO) 7.5-325 MG tablet Take 1-2 tablets by mouth every 6 (six) hours as needed for moderate pain (pain score 4-6). 60 tablet 0   loteprednol (LOTEMAX) 0.5 % ophthalmic suspension 1 drop at bedtime.     Melatonin 10 MG CAPS Take 10 capsules by mouth at bedtime. (Patient taking differently: Take 10 capsules by mouth as needed.)     metFORMIN  (GLUCOPHAGE ) 500 MG tablet Take 1 tablet (500 mg total) by mouth 3 (three) times daily. 270 tablet 3    methocarbamol  (ROBAXIN ) 500 MG tablet Take 1 tablet (500 mg total) by mouth every 8 (eight) hours as needed for muscle spasms. 90 tablet 1   prednisoLONE acetate (PRED FORTE) 1 % ophthalmic suspension 1 drop 4 (four) times daily. (Patient taking differently: 1 drop daily at 2 PM.)     PROLENSA 0.07 % SOLN Apply to eye.     riTUXimab  (RITUXAN) 500 MG/50ML injection Inject into the vein every 6 (six) months.     riTUXimab -abbs (TRUXIMA ) 100 MG/10ML injection Inject into the vein. Injection 3 times a year     tadalafil  (CIALIS ) 5 MG tablet Take 1 tablet (5 mg total) by mouth daily. 30 tablet 11   tamsulosin  (FLOMAX ) 0.4 MG CAPS capsule Take 1 capsule (0.4 mg total) by mouth daily. 90 capsule 3   typhoid (VIVOTIF) DR capsule Take 1 capsule by mouth every other day. 4 capsule 0   verapamil  (CALAN -SR) 240 MG CR tablet Take 1 tablet (240 mg total) by mouth at bedtime. Please stop amlodipine . 90 tablet 3   traZODone  (DESYREL ) 50 MG tablet Take 0.5-1 tablets (25-50 mg total) by mouth at bedtime. 90 tablet 3   No facility-administered medications prior to visit.    ROS: Review of Systems  Constitutional:  Negative for appetite change, fatigue and unexpected weight change.  HENT:  Negative for congestion, nosebleeds,  sneezing, sore throat and trouble swallowing.   Eyes:  Positive for visual disturbance. Negative for itching.  Respiratory:  Negative for cough.   Cardiovascular:  Negative for chest pain, palpitations and leg swelling.  Gastrointestinal:  Negative for abdominal distention, blood in stool, diarrhea and nausea.  Genitourinary:  Negative for frequency and hematuria.  Musculoskeletal:  Negative for back pain, gait problem, joint swelling and neck pain.  Skin:  Negative for rash.  Neurological:  Negative for dizziness, tremors, speech difficulty and weakness.  Psychiatric/Behavioral:  Negative for agitation, dysphoric mood, sleep disturbance and suicidal ideas. The patient is not  nervous/anxious.     Objective:  BP (!) 150/82   Pulse (!) 54   Temp 98.5 F (36.9 C) (Oral)   Ht 5' 7 (1.702 m)   Wt 123 lb 6.4 oz (56 kg)   SpO2 97%   BMI 19.33 kg/m   BP Readings from Last 3 Encounters:  09/25/24 (!) 150/82  06/19/24 118/68  06/19/24 (!) 147/64    Wt Readings from Last 3 Encounters:  09/25/24 123 lb 6.4 oz (56 kg)  06/19/24 126 lb (57.2 kg)  06/19/24 126 lb 12.8 oz (57.5 kg)    Physical Exam Constitutional:      General: He is not in acute distress.    Appearance: Normal appearance. He is well-developed. He is not ill-appearing.     Comments: NAD  Eyes:     Conjunctiva/sclera: Conjunctivae normal.     Pupils: Pupils are equal, round, and reactive to light.  Neck:     Thyroid : No thyromegaly.     Vascular: No JVD.  Cardiovascular:     Rate and Rhythm: Normal rate and regular rhythm.     Heart sounds: Normal heart sounds. No murmur heard.    No friction rub. No gallop.  Pulmonary:     Effort: Pulmonary effort is normal. No respiratory distress.     Breath sounds: Normal breath sounds. No wheezing or rales.  Chest:     Chest wall: No tenderness.  Abdominal:     General: Bowel sounds are normal. There is no distension.     Palpations: Abdomen is soft. There is no mass.     Tenderness: There is no abdominal tenderness. There is no guarding or rebound.  Musculoskeletal:        General: No tenderness. Normal range of motion.     Cervical back: Normal range of motion.  Lymphadenopathy:     Cervical: No cervical adenopathy.  Skin:    General: Skin is warm and dry.     Findings: No rash.  Neurological:     Mental Status: He is alert and oriented to person, place, and time.     Cranial Nerves: No cranial nerve deficit.     Motor: No abnormal muscle tone.     Coordination: Coordination normal.     Gait: Gait abnormal.     Deep Tendon Reflexes: Reflexes are normal and symmetric.  Psychiatric:        Behavior: Behavior normal.        Thought  Content: Thought content normal.        Judgment: Judgment normal.   thin  Lab Results  Component Value Date   WBC 8.6 04/09/2024   HGB 14.3 04/09/2024   HCT 41.9 04/09/2024   PLT 419 04/09/2024   GLUCOSE 162 (H) 06/19/2024   CHOL 166 12/20/2023   TRIG 77.0 12/20/2023   HDL 58.40 12/20/2023   LDLCALC 92 12/20/2023  ALT 17 06/19/2024   AST 27 06/19/2024   NA 140 06/19/2024   K 4.1 06/19/2024   CL 105 06/19/2024   CREATININE 1.48 06/19/2024   BUN 22 06/19/2024   CO2 30 06/19/2024   TSH 1.03 12/20/2023   PSA 0.88 12/20/2023   INR 1.01 09/03/2014   HGBA1C 6.0 06/19/2024   MICROALBUR 0.5 07/11/2018    MR BRAIN/IAC W WO CONTRAST Result Date: 07/31/2022 CLINICAL DATA:  Provided history: Hearing loss. Additional history provided by scanning technologist: Patient reports left greater than right hearing loss for 6 months. Blurred vision. EXAM: MRI HEAD WITHOUT AND WITH CONTRAST TECHNIQUE: Multiplanar, multiecho pulse sequences of the brain and surrounding structures were obtained without and with intravenous contrast. CONTRAST:  12mL MULTIHANCE  GADOBENATE DIMEGLUMINE  529 MG/ML IV SOLN COMPARISON:  CT of the orbits 06/20/2021. Brain MRI 01/05/2021. FINDINGS: Intermittently motion degraded examination. Most notably, there is mild-to-moderate motion degradation of the axial high-resolution heavily T2 weighted sequence through the internal auditory canals and moderate motion degradation of the whole brain axial T1 weighted post-contrast sequence. Brain: Mild to moderate generalized parenchymal atrophy. Multifocal T2 FLAIR hyperintense signal abnormality within the cerebral white matter, nonspecific but compatible with mild chronic small vessel ischemic disease. No evidence of an intracranial mass. Specifically, no cerebellopontine angle or internal auditory canal mass is demonstrated. Unremarkable appearance of the 7th and 8th cranial nerves bilaterally. There is no acute infarct. No chronic  intracranial blood products. No extra-axial fluid collection. No midline shift. No pathologic intracranial enhancement identified. Vascular: Maintained flow voids within the proximal large arterial vessels. Skull and upper cervical spine: No focal suspicious marrow lesion. Incompletely assessed cervical spondylosis. Sinuses/Orbits: No mass or acute finding within the imaged orbits. Prior bilateral ocular lens replacement. Small mucous retention cysts within the bilateral maxillary sinuses. Trace mucosal thickening within the bilateral ethmoid sinuses. IMPRESSION: Intermittently motion degraded examination. No evidence of acute intracranial abnormality. No cerebellopontine angle or internal auditory canal mass. Mild chronic small vessel ischemic changes within the cerebral white matter, similar to the prior brain MRI of 01/05/2021. Mild-to-moderate generalized parenchymal atrophy. Mild paranasal sinus disease, as described. Electronically Signed   By: Rockey Childs D.O.   On: 07/31/2022 08:01    Assessment & Plan:   Problem List Items Addressed This Visit     Diabetes type 2, controlled (HCC) - Primary   Check A1c      Relevant Orders   Comprehensive metabolic panel with GFR   TSH   T4, free   Urinalysis   Microalbumin / creatinine urine ratio   Lipid panel   Hemoglobin A1c   PSA   Dyslipidemia   On Tricor       Relevant Orders   Comprehensive metabolic panel with GFR   TSH   T4, free   Urinalysis   Microalbumin / creatinine urine ratio   Lipid panel   Hemoglobin A1c   PSA   CBC with Differential/Platelet   Insomnia disorder   Trazodone  50 mg at hs (he had it in the past)       Vitamin B 12 deficiency   Chronic Cont w/B12 injections      Relevant Orders   CBC with Differential/Platelet   Vitamin D  deficiency   On Vit D      Other Visit Diagnoses       Bladder neck obstruction       Relevant Orders   PSA         Meds ordered this encounter  Medications    traZODone  (DESYREL ) 50 MG tablet    Sig: Take 0.5-1 tablets (25-50 mg total) by mouth at bedtime.    Dispense:  90 tablet    Refill:  3      Follow-up: Return in about 3 months (around 12/26/2024) for a follow-up visit.  Marolyn Noel, MD

## 2024-09-25 NOTE — Assessment & Plan Note (Signed)
 Check A1c.

## 2024-09-25 NOTE — Assessment & Plan Note (Signed)
 Trazodone  50 mg at hs (he had it in the past)

## 2024-09-25 NOTE — Assessment & Plan Note (Signed)
On Tricor 

## 2024-09-25 NOTE — Assessment & Plan Note (Signed)
 On Vit D

## 2024-09-25 NOTE — Assessment & Plan Note (Signed)
Chronic Cont w/B12 injections

## 2024-09-27 ENCOUNTER — Ambulatory Visit: Payer: Self-pay | Admitting: Internal Medicine

## 2024-09-27 DIAGNOSIS — D649 Anemia, unspecified: Secondary | ICD-10-CM

## 2024-09-27 DIAGNOSIS — E119 Type 2 diabetes mellitus without complications: Secondary | ICD-10-CM

## 2024-10-01 ENCOUNTER — Telehealth: Payer: Self-pay | Admitting: *Deleted

## 2024-10-01 ENCOUNTER — Telehealth: Payer: Self-pay | Admitting: Neurology

## 2024-10-01 NOTE — Telephone Encounter (Signed)
 Spoke w/ Dr. Vear. Last OV note from Dr. Ines mentioned pt Ruxience  dose would be decreased to 500mg . Dr Vear confirmed change. Provided updated order to [facility].

## 2024-10-01 NOTE — Telephone Encounter (Signed)
 Called and left voicemail for patient to reschedule appointment on 12/08/24 with [name].  If patient calls back, they can be rescheduled with Dr Vear

## 2024-10-30 ENCOUNTER — Other Ambulatory Visit: Payer: Self-pay

## 2024-10-30 DIAGNOSIS — Z79899 Other long term (current) drug therapy: Secondary | ICD-10-CM

## 2024-10-30 DIAGNOSIS — L121 Cicatricial pemphigoid: Secondary | ICD-10-CM

## 2024-10-30 DIAGNOSIS — G44019 Episodic cluster headache, not intractable: Secondary | ICD-10-CM

## 2024-10-30 DIAGNOSIS — G8929 Other chronic pain: Secondary | ICD-10-CM

## 2024-10-31 LAB — T + B-LYMPHOCYTE DIFFERENTIAL
% CD 3 Pos. Lymph.: 77.6 % (ref 57.5–86.2)
% CD 4 Pos. Lymph.: 31.4 % (ref 30.8–58.5)
Absolute CD 3: 1474 /uL (ref 622–2402)
Absolute CD 4 Helper: 597 /uL (ref 359–1519)
Basophils Absolute: 0 x10E3/uL (ref 0.0–0.2)
Basos: 0 %
CD19 % B Cell: 0.1 % — ABNORMAL LOW (ref 3.3–25.4)
CD19 Abs: 2 /uL — ABNORMAL LOW (ref 12–645)
CD4/CD8 Ratio: 0.68 — ABNORMAL LOW (ref 0.92–3.72)
CD8 % Suppressor T Cell: 46.3 % — ABNORMAL HIGH (ref 12.0–35.5)
CD8 T Cell Abs: 880 /uL (ref 109–897)
EOS (ABSOLUTE): 0.1 x10E3/uL (ref 0.0–0.4)
Eos: 1 %
Hematocrit: 42.2 % (ref 37.5–51.0)
Hemoglobin: 13.8 g/dL (ref 13.0–17.7)
Immature Grans (Abs): 0 x10E3/uL (ref 0.0–0.1)
Immature Granulocytes: 0 %
Lymphocytes Absolute: 1.9 x10E3/uL (ref 0.7–3.1)
Lymphs: 30 %
MCH: 30.2 pg (ref 26.6–33.0)
MCHC: 32.7 g/dL (ref 31.5–35.7)
MCV: 92 fL (ref 79–97)
Monocytes Absolute: 0.5 x10E3/uL (ref 0.1–0.9)
Monocytes: 8 %
Neutrophils Absolute: 3.8 x10E3/uL (ref 1.4–7.0)
Neutrophils: 61 %
Platelets: 472 x10E3/uL — ABNORMAL HIGH (ref 150–450)
RBC: 4.57 x10E6/uL (ref 4.14–5.80)
RDW: 13.8 % (ref 11.6–15.4)
WBC: 6.3 x10E3/uL (ref 3.4–10.8)

## 2024-10-31 LAB — COMPREHENSIVE METABOLIC PANEL WITH GFR
ALT: 18 IU/L (ref 0–44)
AST: 26 IU/L (ref 0–40)
Albumin: 4.5 g/dL (ref 3.7–4.7)
Alkaline Phosphatase: 84 IU/L (ref 48–129)
BUN/Creatinine Ratio: 13 (ref 10–24)
BUN: 19 mg/dL (ref 8–27)
Bilirubin Total: 0.4 mg/dL (ref 0.0–1.2)
CO2: 25 mmol/L (ref 20–29)
Calcium: 10.4 mg/dL — ABNORMAL HIGH (ref 8.6–10.2)
Chloride: 101 mmol/L (ref 96–106)
Creatinine, Ser: 1.48 mg/dL — ABNORMAL HIGH (ref 0.76–1.27)
Globulin, Total: 2.8 g/dL (ref 1.5–4.5)
Glucose: 126 mg/dL — ABNORMAL HIGH (ref 70–99)
Potassium: 4.5 mmol/L (ref 3.5–5.2)
Sodium: 140 mmol/L (ref 134–144)
Total Protein: 7.3 g/dL (ref 6.0–8.5)
eGFR: 47 mL/min/1.73 — ABNORMAL LOW (ref >59)

## 2024-11-02 ENCOUNTER — Ambulatory Visit: Payer: Self-pay | Admitting: Neurology

## 2024-12-08 ENCOUNTER — Ambulatory Visit: Admitting: Neurology

## 2025-01-01 ENCOUNTER — Ambulatory Visit: Payer: Self-pay | Admitting: Emergency Medicine

## 2025-01-01 ENCOUNTER — Encounter: Payer: Self-pay | Admitting: Emergency Medicine

## 2025-01-01 ENCOUNTER — Ambulatory Visit: Admitting: Emergency Medicine

## 2025-01-01 VITALS — BP 140/80 | HR 67 | Temp 98.1°F | Ht 67.0 in | Wt 123.0 lb

## 2025-01-01 DIAGNOSIS — G44019 Episodic cluster headache, not intractable: Secondary | ICD-10-CM

## 2025-01-01 DIAGNOSIS — I10 Essential (primary) hypertension: Secondary | ICD-10-CM

## 2025-01-01 LAB — COMPREHENSIVE METABOLIC PANEL WITH GFR
ALT: 15 U/L (ref 3–53)
AST: 22 U/L (ref 5–37)
Albumin: 4.1 g/dL (ref 3.5–5.2)
Alkaline Phosphatase: 70 U/L (ref 39–117)
BUN: 23 mg/dL (ref 6–23)
CO2: 29 meq/L (ref 19–32)
Calcium: 9.5 mg/dL (ref 8.4–10.5)
Chloride: 103 meq/L (ref 96–112)
Creatinine, Ser: 1.36 mg/dL (ref 0.40–1.50)
GFR: 48.38 mL/min — ABNORMAL LOW
Glucose, Bld: 151 mg/dL — ABNORMAL HIGH (ref 70–99)
Potassium: 3.6 meq/L (ref 3.5–5.1)
Sodium: 138 meq/L (ref 135–145)
Total Bilirubin: 0.4 mg/dL (ref 0.2–1.2)
Total Protein: 6.5 g/dL (ref 6.0–8.3)

## 2025-01-01 LAB — CBC WITH DIFFERENTIAL/PLATELET
Basophils Absolute: 0 K/uL (ref 0.0–0.1)
Basophils Relative: 0.4 % (ref 0.0–3.0)
Eosinophils Absolute: 0.1 K/uL (ref 0.0–0.7)
Eosinophils Relative: 1.5 % (ref 0.0–5.0)
HCT: 38.2 % — ABNORMAL LOW (ref 39.0–52.0)
Hemoglobin: 12.9 g/dL — ABNORMAL LOW (ref 13.0–17.0)
Lymphocytes Relative: 27.5 % (ref 12.0–46.0)
Lymphs Abs: 2.2 K/uL (ref 0.7–4.0)
MCHC: 33.7 g/dL (ref 30.0–36.0)
MCV: 91.7 fl (ref 78.0–100.0)
Monocytes Absolute: 0.7 K/uL (ref 0.1–1.0)
Monocytes Relative: 8.3 % (ref 3.0–12.0)
Neutro Abs: 4.9 K/uL (ref 1.4–7.7)
Neutrophils Relative %: 62.3 % (ref 43.0–77.0)
Platelets: 359 K/uL (ref 150.0–400.0)
RBC: 4.16 Mil/uL — ABNORMAL LOW (ref 4.22–5.81)
RDW: 13.9 % (ref 11.5–15.5)
WBC: 7.9 K/uL (ref 4.0–10.5)

## 2025-01-01 LAB — SEDIMENTATION RATE: Sed Rate: 4 mm/h (ref 0–20)

## 2025-01-01 MED ORDER — HYDROCODONE-ACETAMINOPHEN 5-325 MG PO TABS: 1.0000 | ORAL_TABLET | Freq: Four times a day (QID) | ORAL | 0 refills | Status: DC | PRN

## 2025-01-01 NOTE — Assessment & Plan Note (Signed)
 BP Readings from Last 3 Encounters:  01/01/25 (!) 140/80  09/25/24 (!) 150/82  06/19/24 118/68  Elevated blood pressure reading in the office Normal readings at home Medication list only lists verapamil  240 mg daily Last assessment note mentions Hytrin , Coreg  and losartan ?

## 2025-01-01 NOTE — Progress Notes (Signed)
 Louis Kemp 84 y.o.   Chief Complaint  Patient presents with   Headache    For about 10 days. No sensitivity to light     HISTORY OF PRESENT ILLNESS: Acute problem visit today. This is a 84 y.o. male complaining of right sided headache on and off for the last 10 days History of the same about 5 years ago.  Possibly cluster headache at that time No other associated symptoms.  Denies head injuries. No other complaints or medical concerns today.  Headache  Pertinent negatives include no abdominal pain, coughing, dizziness, fever, nausea, sore throat or vomiting.     Prior to Admission medications  Medication Sig Start Date End Date Taking? Authorizing Provider  atovaquone -proguanil (MALARONE ) 250-100 MG TABS tablet Start 1 tab a day one day prior to your trip. Take 1 tablet daily while traveling. Continue 1 tablet daily for 1 week after retrun   Yes [provider]  Cholecalciferol  (VITAMIN D ) 2000 UNITS CAPS Take 1 capsule by mouth every other day.   Yes [provider]  cyanocobalamin  (VITAMIN B12) 1000 MCG/ML injection Inject 1 mL (1,000 mcg total) into the skin every 14 (fourteen) days. 08/20/23  Yes [name], [name], MD  diphenoxylate -atropine  (LOMOTIL ) 2.5-0.025 MG tablet Take 1-2 tablets by mouth 4 (four) times daily as needed for diarrhea or loose stools.   Yes [provider]  escitalopram  (LEXAPRO ) 5 MG tablet Take 1 tablet (5 mg total) by mouth at bedtime. 05/20/24  Yes [name], [name], MD  fenofibrate  (TRICOR ) 145 MG tablet Take 1 tablet (145 mg total) by mouth daily. 02/21/24  Yes [name], Aleksei V, MD  fluorometholone (FML) 0.1 % ophthalmic suspension Apply 1 drop to eye. 05/26/24 05/26/25 Yes [provider]  gabapentin  (NEURONTIN ) 100 MG capsule Take 1 capsule (100 mg total) by mouth at bedtime. 06/19/24  Yes [name], [name], MD  Galcanezumab -gnlm (EMGALITY , 300 MG DOSE,) 100 MG/ML SOSY Inject 300 mg (3 pens) into the skin  every 30 days during cluster headache cycle and stop when cycle is over. 03/19/24  Yes [name], [name], MD  HYDROcodone -acetaminophen  (NORCO) 7.5-325 MG tablet Take 1-2 tablets by mouth every 6 (six) hours as needed for moderate pain (pain score 4-6). 06/19/24  Yes [name], Aleksei V, MD  loteprednol (LOTEMAX) 0.5 % ophthalmic suspension 1 drop at bedtime. 02/04/24  Yes [provider]  Melatonin 10 MG CAPS Take 10 capsules by mouth at bedtime. Patient taking differently: Take 10 capsules by mouth as needed.   Yes [provider]  metFORMIN  (GLUCOPHAGE ) 500 MG tablet Take 1 tablet (500 mg total) by mouth 3 (three) times daily. 12/20/23  Yes [name], [name], MD  methocarbamol  (ROBAXIN ) 500 MG tablet Take 1 tablet (500 mg total) by mouth every 8 (eight) hours as needed for muscle spasms. 03/19/24  Yes [name], Aleksei V, MD  prednisoLONE acetate (PRED FORTE) 1 % ophthalmic suspension 1 drop 4 (four) times daily. Patient taking differently: 1 drop daily at 2 PM.   Yes [provider]  PROLENSA 0.07 % SOLN Apply to eye. 09/15/22  Yes [provider]  riTUXimab  (RITUXAN) 500 MG/50ML injection Inject into the vein every 6 (six) months.   Yes [provider]  riTUXimab -abbs (TRUXIMA ) 100 MG/10ML injection Inject into the vein. Injection 3 times a year   Yes [provider]  tadalafil  (CIALIS ) 5 MG tablet Take 1 tablet (5 mg total) by mouth daily. 06/19/24  Yes [name], [name], MD  tamsulosin  (FLOMAX ) 0.4 MG CAPS capsule Take 1 capsule (0.4 mg total) by mouth daily. 12/20/23  Yes [name], [name], MD  traZODone  (DESYREL ) 50 MG tablet Take 0.5-1 tablets (25-50 mg total) by mouth at bedtime. 09/25/24  Yes [name], Aleksei V, MD  typhoid (VIVOTIF) DR capsule Take 1 capsule by mouth every other day. 04/05/23  Yes [name], [name], MD  verapamil  (CALAN -SR) 240 MG CR tablet Take 1 tablet (240 mg total) by mouth at bedtime. Please stop  amlodipine . 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   Travel advice encounter 04/05/2023   Diverticular disease of colon 01/04/2023   Gastroesophageal reflux disease 01/04/2023   Iron deficiency anemia 01/04/2023   Loss of appetite 01/04/2023   History of colonic polyps 01/04/2023   Long-term use of high-risk medication 12/03/2022   Asymmetric SNHL (sensorineural hearing loss) 07/14/2022   Presbycusis of both ears 07/14/2022   Hearing loss 05/31/2022   Pseudophakia of both eyes 07/12/2021   Malnutrition 06/23/2021   Episodic cluster headache, not intractable 06/09/2021   Ocular pemphigoid (HCC) 05/19/2021   Chronic sinusitis 05/12/2021   Depression 05/12/2021   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   Right sided temporal headache 01/06/2021   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   Chronic renal insufficiency, stage 3 (moderate) 05/11/2020   Pemphigoid (HCC) 05/10/2020   Elevated serum creatinine 07/11/2018   Hip pain, acute, right 01/10/2018   Postoperative anemia due to acute blood loss 09/30/2014   Status post left partial knee replacement 09/15/2014   S/P left UKR 09/14/2014   S/P right TKA 06/15/2014   Osteoarthritis, knee 11/24/2013   Weight loss 06/24/2013   PAC (premature atrial contraction) 01/13/2013   Heart murmur 01/13/2013   Well adult exam 11/03/2012   Paresthesia 10/22/2012   Hypertension 04/18/2012   Diabetes type 2, controlled (HCC) 10/20/2010   Vitamin B 12 deficiency 10/20/2010   Vitamin D  deficiency 10/20/2010   Dyslipidemia 07/14/2010   BPH (benign prostatic hypertrophy) 07/14/2010   OSTEOARTHRITIS 07/14/2010   Low back pain 07/14/2010   HYPERGLYCEMIA 07/14/2010    Past Medical History:  Diagnosis Date   BPH  (benign prostatic hypertrophy)    Hyperlipidemia    Hypertension    LBP (low back pain)    MVP (mitral valve prolapse)    OCP (ocular cicatricial pemphigoid) (HCC) 01/2020   on cellcept   Osteoarthritis    PAC (premature atrial contraction)    Statin intolerance    Type II or unspecified type diabetes mellitus without mention of complication, not stated as uncontrolled 2011   Vitamin B 12 deficiency 2011   Vitamin D  deficiency    Vitiligo     Past Surgical History:  Procedure Laterality Date   arm surgery Left    ORIF left  forearm   CATARACT EXTRACTION  2012   EYE SURGERY     eye surgery Right 05/02/2022   remove scar on cornea and then put amniotic cells on it   eyelid surgery Right    HERNIA REPAIR Bilateral 1971   PARTIAL KNEE ARTHROPLASTY Left 09/14/2014   Procedure: LEFT UNICOMPARTMENTAL KNEE ARTHROPLASTY MEDIALLY;  Surgeon: Donnice JONETTA Car, MD;  Location: [facility];  Service: Orthopedics;  Laterality: Left;   TOTAL KNEE ARTHROPLASTY Right 06/15/2014   Procedure:  RIGHT TOTAL KNEE ARTHROPLASTY;  Surgeon: Donnice JONETTA Car, MD;  Location: [facility];  Service: Orthopedics;  Laterality: Right;    Social History   Socioeconomic History   Marital status: Married    Spouse name: [name]   Number of children: Not on file   Years of education: Not on file   Highest education level: Not on file  Occupational History   Occupation: MD Retired  Tobacco Use   Smoking status: Former    Types: Cigars   Smokeless tobacco: Never   Tobacco comments:    3-4 cigars per year in the past but none in the past 10 years  Vaping Use   Vaping status: Never Used  Substance and Sexual Activity   Alcohol use: Not Currently    Alcohol/week: 0.0 - 1.0 standard drinks of alcohol    Comment: maybe 1 mixed drink maybe once a month   Drug use: No   Sexual activity: Not Currently  Other Topics Concern   Not on file  Social History Narrative   Regular Exercise -  YES, golf   Lives at home with wife     Right handed   Caffeine :  1-2 cups/day      Lives with wife-2025   Retired    Social Drivers of Health   Tobacco Use: Medium Risk (01/01/2025)   Patient History    Smoking Tobacco Use: Former    Smokeless Tobacco Use: Never    Passive Exposure: Not on Actuary Strain: Low Risk (02/01/2024)   Overall Financial Resource Strain (CARDIA)    Difficulty of Paying Living Expenses: Not hard at all  Food Insecurity: No Food Insecurity (02/01/2024)   Hunger Vital Sign    Worried About Running Out of Food in the Last Year: Never true    Ran Out of Food in the Last Year: Never true  Transportation Needs: No Transportation Needs (02/01/2024)   PRAPARE - Administrator, Civil Service (Medical): No    Lack of Transportation (Non-Medical): No  Physical Activity: Insufficiently Active (02/01/2024)   Exercise Vital Sign    Days of Exercise per Week: 4 days    Minutes of Exercise per Session: 30 min  Stress: No Stress Concern Present (02/01/2024)   [employer] of Occupational Health - Occupational Stress Questionnaire    Feeling of Stress : Not at all  Social Connections: Moderately Isolated (02/01/2024)   Social Connection and Isolation Panel    Frequency of Communication with Friends and Family: More than three times a week    Frequency of Social Gatherings with Friends and Family: Not on file    Attends Religious Services: Never    Active Member of Clubs or Organizations: No    Attends Banker Meetings: Never    Marital Status: Married  Catering Manager Violence: Not At Risk (02/01/2024)   Humiliation, Afraid, Rape, and Kick questionnaire    Fear of Current or Ex-Partner: No    Emotionally Abused: No    Physically Abused: No    Sexually Abused: No  Depression (PHQ2-9): Low Risk (01/01/2025)   Depression (PHQ2-9)    PHQ-2 Score: 0  Alcohol Screen: Low Risk (02/01/2024)   Alcohol Screen    Last Alcohol Screening Score (AUDIT): 0  Housing: Unknown (02/01/2024)    Housing Stability Vital Sign    Unable to Pay for Housing in the Last Year: No    Number of Times Moved in the Last Year: Not on file  Homeless in the Last Year: No  Utilities: Not At Risk (02/01/2024)   [facility] Utilities    Threatened with loss of utilities: No  Health Literacy: Adequate Health Literacy (02/01/2024)   B1300 Health Literacy    Frequency of need for help with medical instructions: Never    Family History  Problem Relation Age of Onset   Arthritis Mother    Hypertension Father    Coronary artery disease Father    Hypertension Brother    Hypertension Other    Hypertension Brother    Coronary artery disease Other      Review of Systems  Constitutional: Negative.  Negative for chills and fever.  HENT: Negative.  Negative for congestion and sore throat.   Respiratory: Negative.  Negative for cough and shortness of breath.   Cardiovascular: Negative.  Negative for chest pain and palpitations.  Gastrointestinal:  Negative for abdominal pain, nausea and vomiting.  Neurological:  Positive for headaches. Negative for dizziness, sensory change, speech change, focal weakness and loss of consciousness.  All other systems reviewed and are negative.   Vitals:   01/01/25 1334 01/01/25 1340  BP: (!) 140/80 (!) 140/80  Pulse: 67   Temp: 98.1 F (36.7 C)   SpO2: 98%     Physical Exam Vitals reviewed.  Constitutional:      Appearance: Normal appearance.  HENT:     Head: Normocephalic.     Mouth/Throat:     Mouth: Mucous membranes are moist.     Pharynx: Oropharynx is clear.  Eyes:     Extraocular Movements: Extraocular movements intact.     Conjunctiva/sclera: Conjunctivae normal.     Pupils: Pupils are equal, round, and reactive to light.  Neck:     Vascular: No carotid bruit.  Cardiovascular:     Rate and Rhythm: Normal rate and regular rhythm.     Pulses: Normal pulses.     Heart sounds: Normal heart sounds.  Pulmonary:     Effort: Pulmonary effort is  normal.     Breath sounds: Normal breath sounds.  Abdominal:     Palpations: Abdomen is soft.     Tenderness: There is no abdominal tenderness.  Musculoskeletal:     Cervical back: No tenderness.  Lymphadenopathy:     Cervical: No cervical adenopathy.  Skin:    General: Skin is warm and dry.  Neurological:     General: No focal deficit present.     Mental Status: He is alert and oriented to person, place, and time.     Cranial Nerves: No cranial nerve deficit.     Sensory: No sensory deficit.     Motor: No weakness.     Coordination: Coordination normal.     Gait: Gait normal.    Results for orders placed or performed in visit on 01/01/25 (from the past 24 hours)  Sedimentation rate     Status: None   Collection Time: 01/01/25  2:05 PM  Result Value Ref Range   Sed Rate 4 0 - 20 mm/hr  CBC with Differential/Platelet     Status: Abnormal   Collection Time: 01/01/25  2:05 PM  Result Value Ref Range   WBC 7.9 4.0 - 10.5 K/uL   RBC 4.16 (L) 4.22 - 5.81 Mil/uL   Hemoglobin 12.9 (L) 13.0 - 17.0 g/dL   HCT 61.7 (L) 60.9 - 47.9 %   MCV 91.7 78.0 - 100.0 fl   MCHC 33.7 30.0 - 36.0 g/dL   RDW 86.0 88.4 -  15.5 %   Platelets 359.0 150.0 - 400.0 K/uL   Neutrophils Relative % 62.3 43.0 - 77.0 %   Lymphocytes Relative 27.5 12.0 - 46.0 %   Monocytes Relative 8.3 3.0 - 12.0 %   Eosinophils Relative 1.5 0.0 - 5.0 %   Basophils Relative 0.4 0.0 - 3.0 %   Neutro Abs 4.9 1.4 - 7.7 K/uL   Lymphs Abs 2.2 0.7 - 4.0 K/uL   Monocytes Absolute 0.7 0.1 - 1.0 K/uL   Eosinophils Absolute 0.1 0.0 - 0.7 K/uL   Basophils Absolute 0.0 0.0 - 0.1 K/uL  Comprehensive metabolic panel with GFR     Status: Abnormal   Collection Time: 01/01/25  2:05 PM  Result Value Ref Range   Sodium 138 135 - 145 mEq/L   Potassium 3.6 3.5 - 5.1 mEq/L   Chloride 103 96 - 112 mEq/L   CO2 29 19 - 32 mEq/L   Glucose, Bld 151 (H) 70 - 99 mg/dL   BUN 23 6 - 23 mg/dL   Creatinine, Ser 8.63 0.40 - 1.50 mg/dL   Total  Bilirubin 0.4 0.2 - 1.2 mg/dL   Alkaline Phosphatase 70 39 - 117 U/L   AST 22 5 - 37 U/L   ALT 15 3 - 53 U/L   Total Protein 6.5 6.0 - 8.3 g/dL   Albumin 4.1 3.5 - 5.2 g/dL   GFR 51.61 (L) >39.99 mL/min   Calcium 9.5 8.4 - 10.5 mg/dL      ASSESSMENT & PLAN: Problem List Items Addressed This Visit       Cardiovascular and Mediastinum   Hypertension   BP Readings from Last 3 Encounters:  01/01/25 (!) 140/80  09/25/24 (!) 150/82  06/19/24 118/68  Elevated blood pressure reading in the office Normal readings at home Medication list only lists verapamil  240 mg daily Last assessment note mentions Hytrin , Coreg  and losartan ?          Nervous and Auditory   Episodic cluster headache, not intractable - Primary   Clinically stable.  No red flag signs or symptoms Normal neurological exam. Unremarkable blood work with sedimentation rate of 4 Doubt temporal arteritis.  No temporal artery tenderness Pain management discussed.  Recommend Tylenol  for mild to moderate pain and Norco for moderate to severe pain Need to follow-up with PCP next week as soon as available ED precautions given       Relevant Medications   HYDROcodone -acetaminophen  (NORCO/VICODIN) 5-325 MG tablet   Other Relevant Orders   Comprehensive metabolic panel with GFR (Completed)   CBC with Differential/Platelet (Completed)   Sedimentation rate (Completed)   Patient Instructions  General Headache Without Cause A headache is pain or discomfort you feel around the head or neck area. There are many causes and types of headaches. In some cases, the cause may not be found. Follow these instructions at home: Watch your condition for any changes. Let your doctor know about them. Take these steps to help with your condition: Managing pain     Take over-the-counter and prescription medicines only as told by your doctor. This includes medicines for pain that are taken by mouth or put on the skin. Lie down in a  dark, quiet room when you have a headache. If told, put ice on your head and neck area: Put ice in a plastic bag. Place a towel between your skin and the bag. Leave the ice on for 20 minutes, 2-3 times per day. Take off the ice if your skin  turns bright red. This is very important. If you cannot feel pain, heat, or cold, you have a greater risk of damage to the area. If told, put heat on the affected area. Use the heat source that your doctor recommends, such as a moist heat pack or a heating pad. Place a towel between your skin and the heat source. Leave the heat on for 20-30 minutes. Take off the heat if your skin turns bright red. This is very important. If you cannot feel pain, heat, or cold, you have a greater risk of getting burned. Keep lights dim if bright lights bother you or make your headaches worse. Eating and drinking Eat meals on a regular schedule. If you drink alcohol: Limit how much you have to: 0-1 drink a day for women who are not pregnant. 0-2 drinks a day for men. Know how much alcohol is in a drink. In the [place] one drink equals one 12 oz bottle of beer (355 mL), one 5 oz glass of wine (148 mL), or one 1 oz glass of hard liquor (44 mL). Stop drinking caffeine , or drink less caffeine . General instructions  Keep a journal to find out if certain things bring on headaches. For example, write down: What you eat and drink. How much sleep you get. Any change to your diet or medicines. Get a massage or try other ways to relax. Limit stress. Sit up straight. Do not tighten (tense) your muscles. Do not smoke or use any products that contain nicotine or tobacco. If you need help quitting, ask your doctor. Exercise regularly as told by your doctor. Get enough sleep. This often means 7-9 hours of sleep each night. Keep all follow-up visits. This is important. Contact a doctor if: Medicine does not help your symptoms. You have a headache that feels different than the other  headaches. You feel like you may vomit (nauseous) or you vomit. You have a fever. Get help right away if: Your headache: Gets very bad quickly. Gets worse after a lot of physical activity. You have any of these symptoms: You continue to vomit. A stiff neck. Trouble seeing. Your eye or ear hurts. Trouble speaking. Weak muscles or you lose muscle control. You lose your balance or have trouble walking. You feel like you will pass out (faint) or you pass out. You are mixed up (confused). You have a seizure. These symptoms may be an emergency. Get help right away. Call your local emergency services (911 in the [place] Do not wait to see if the symptoms will go away. Do not drive yourself to the hospital. Summary A headache is pain or discomfort that is felt around the head or neck area. There are many causes and types of headaches. In some cases, the cause may not be found. Keep a journal to help find out what causes your headaches. Watch your condition for any changes. Let your doctor know about them. Contact a doctor if you have a headache that is different from usual, or if medicine does not help your headache. Get help right away if your headache gets very bad, you throw up, you have trouble seeing, you lose your balance, or you have a seizure. This information is not intended to replace advice given to you by your health care provider. Make sure you discuss any questions you have with your health care provider. Document Revised: 05/11/2021 Document Reviewed: 05/11/2021 Elsevier Patient Education  2024 Elsevier Inc.     Emil Schaumann, MD [facility]  at [facility]     [1]  Allergies Allergen Reactions   Statins     Other reaction(s): Muscle Pain   Atorvastatin     REACTION: aches   Rosuvastatin     REACTION: aches   Zolpidem Tartrate     Did not work well   Zonisamide      Abd colics

## 2025-01-01 NOTE — Patient Instructions (Signed)
 General Headache Without Cause A headache is pain or discomfort you feel around the head or neck area. There are many causes and types of headaches. In some cases, the cause may not be found. Follow these instructions at home: Watch your condition for any changes. Let your doctor know about them. Take these steps to help with your condition: Managing pain     Take over-the-counter and prescription medicines only as told by your doctor. This includes medicines for pain that are taken by mouth or put on the skin. Lie down in a dark, quiet room when you have a headache. If told, put ice on your head and neck area: Put ice in a plastic bag. Place a towel between your skin and the bag. Leave the ice on for 20 minutes, 2-3 times per day. Take off the ice if your skin turns bright red. This is very important. If you cannot feel pain, heat, or cold, you have a greater risk of damage to the area. If told, put heat on the affected area. Use the heat source that your doctor recommends, such as a moist heat pack or a heating pad. Place a towel between your skin and the heat source. Leave the heat on for 20-30 minutes. Take off the heat if your skin turns bright red. This is very important. If you cannot feel pain, heat, or cold, you have a greater risk of getting burned. Keep lights dim if bright lights bother you or make your headaches worse. Eating and drinking Eat meals on a regular schedule. If you drink alcohol: Limit how much you have to: 0-1 drink a day for women who are not pregnant. 0-2 drinks a day for men. Know how much alcohol is in a drink. In the [place] one drink equals one 12 oz bottle of beer (355 mL), one 5 oz glass of wine (148 mL), or one 1 oz glass of hard liquor (44 mL). Stop drinking caffeine, or drink less caffeine. General instructions  Keep a journal to find out if certain things bring on headaches. For example, write down: What you eat and drink. How much sleep you  get. Any change to your diet or medicines. Get a massage or try other ways to relax. Limit stress. Sit up straight. Do not tighten (tense) your muscles. Do not smoke or use any products that contain nicotine or tobacco. If you need help quitting, ask your doctor. Exercise regularly as told by your doctor. Get enough sleep. This often means 7-9 hours of sleep each night. Keep all follow-up visits. This is important. Contact a doctor if: Medicine does not help your symptoms. You have a headache that feels different than the other headaches. You feel like you may vomit (nauseous) or you vomit. You have a fever. Get help right away if: Your headache: Gets very bad quickly. Gets worse after a lot of physical activity. You have any of these symptoms: You continue to vomit. A stiff neck. Trouble seeing. Your eye or ear hurts. Trouble speaking. Weak muscles or you lose muscle control. You lose your balance or have trouble walking. You feel like you will pass out (faint) or you pass out. You are mixed up (confused). You have a seizure. These symptoms may be an emergency. Get help right away. Call your local emergency services (911 in the [place] Do not wait to see if the symptoms will go away. Do not drive yourself to the hospital. Summary A headache is pain or discomfort that  is felt around the head or neck area. There are many causes and types of headaches. In some cases, the cause may not be found. Keep a journal to help find out what causes your headaches. Watch your condition for any changes. Let your doctor know about them. Contact a doctor if you have a headache that is different from usual, or if medicine does not help your headache. Get help right away if your headache gets very bad, you throw up, you have trouble seeing, you lose your balance, or you have a seizure. This information is not intended to replace advice given to you by your health care provider. Make sure you  discuss any questions you have with your health care provider. Document Revised: 05/11/2021 Document Reviewed: 05/11/2021 Elsevier Patient Education  2024 [employer].

## 2025-01-01 NOTE — Assessment & Plan Note (Signed)
 Clinically stable.  No red flag signs or symptoms Normal neurological exam. Unremarkable blood work with sedimentation rate of 4 Doubt temporal arteritis.  No temporal artery tenderness Pain management discussed.  Recommend Tylenol  for mild to moderate pain and Norco for moderate to severe pain Need to follow-up with PCP next week as soon as available ED precautions given

## 2025-01-06 ENCOUNTER — Ambulatory Visit: Payer: Self-pay | Admitting: Internal Medicine

## 2025-01-06 ENCOUNTER — Ambulatory Visit: Admitting: Internal Medicine

## 2025-01-06 ENCOUNTER — Encounter: Payer: Self-pay | Admitting: Internal Medicine

## 2025-01-06 VITALS — BP 142/78 | HR 57 | Ht 67.0 in | Wt 122.1 lb

## 2025-01-06 DIAGNOSIS — R519 Headache, unspecified: Secondary | ICD-10-CM | POA: Diagnosis not present

## 2025-01-06 DIAGNOSIS — L121 Cicatricial pemphigoid: Secondary | ICD-10-CM

## 2025-01-06 DIAGNOSIS — E119 Type 2 diabetes mellitus without complications: Secondary | ICD-10-CM

## 2025-01-06 DIAGNOSIS — E538 Deficiency of other specified B group vitamins: Secondary | ICD-10-CM

## 2025-01-06 DIAGNOSIS — G44019 Episodic cluster headache, not intractable: Secondary | ICD-10-CM

## 2025-01-06 LAB — SEDIMENTATION RATE: Sed Rate: 6 mm/h (ref 0–20)

## 2025-01-06 LAB — HEMOGLOBIN A1C: Hgb A1c MFr Bld: 5.7 % (ref 4.6–6.5)

## 2025-01-06 LAB — T4, FREE: Free T4: 0.88 ng/dL (ref 0.60–1.60)

## 2025-01-06 LAB — C-REACTIVE PROTEIN: CRP: <0.5 mg/dL — ABNORMAL LOW (ref 1.0–20.0)

## 2025-01-06 MED ORDER — PREDNISONE 10 MG PO TABS: ORAL_TABLET | ORAL | 1 refills | Status: AC

## 2025-01-06 NOTE — Assessment & Plan Note (Signed)
 New moderate  R sided HA,  6/10 (8/10 at times) x 2 weeks. Chewing makes it worse... No new vision loss, no wt loss.SABRASABRANo rash Jaque took 30 mg Prednisone  on 12/26/24. ESR was 4 on 01/01/25.  Dr Rosemarie does not think it is GCA... He has a Neurology appt tomorrow 8 am - Dr Rosemarie wants to hold off brain CT, temporal artery bx till tomorrow Empiric Prednisone  Rx taper given Repeat ESR, check CRP

## 2025-01-06 NOTE — Progress Notes (Signed)
 "  Subjective:  Patient ID: Louis Kemp, male    DOB: 02-27-1941  Age: 84 y.o. MRN: [id number]  CC: Medical Management of Chronic Issues (Follow up)   HPI Louis Kemp presents for moderate  R sided HA,  6/10 (8/10 at times) x 2 weeks. Chewing makes it worse... No new vision loss, no wt loss... Verdis took 30 mg Prednisone  on 12/26/24. ESR was 4 on 01/01/25. Dr Popp does not think it is PMR... He has a Neurology appt tomorrow 8 am Brain MRI 2023  Outpatient Medications Prior to Visit  Medication Sig Dispense Refill   atovaquone -proguanil (MALARONE ) 250-100 MG TABS tablet Start 1 tab a day one day prior to your trip. Take 1 tablet daily while traveling. Continue 1 tablet daily for 1 week after retrun     Cholecalciferol  (VITAMIN D ) 2000 UNITS CAPS Take 1 capsule by mouth every other day.     cyanocobalamin  (VITAMIN B12) 1000 MCG/ML injection Inject 1 mL (1,000 mcg total) into the skin every 14 (fourteen) days. 10 mL 3   diphenoxylate -atropine  (LOMOTIL ) 2.5-0.025 MG tablet Take 1-2 tablets by mouth 4 (four) times daily as needed for diarrhea or loose stools.     escitalopram  (LEXAPRO ) 5 MG tablet Take 1 tablet (5 mg total) by mouth at bedtime. 30 tablet 5   fenofibrate  (TRICOR ) 145 MG tablet Take 1 tablet (145 mg total) by mouth daily. 90 tablet 3   fluorometholone (FML) 0.1 % ophthalmic suspension Apply 1 drop to eye.     gabapentin  (NEURONTIN ) 100 MG capsule Take 1 capsule (100 mg total) by mouth at bedtime. 90 capsule 1   Galcanezumab -gnlm (EMGALITY , 300 MG DOSE,) 100 MG/ML SOSY Inject 300 mg (3 pens) into the skin every 30 days during cluster headache cycle and stop when cycle is over.     HYDROcodone -acetaminophen  (NORCO) 7.5-325 MG tablet Take 1-2 tablets by mouth every 6 (six) hours as needed for moderate pain (pain score 4-6). 60 tablet 0   HYDROcodone -acetaminophen  (NORCO/VICODIN) 5-325 MG tablet Take 1 tablet by mouth every 6 (six) hours as needed. 15 tablet 0   loteprednol (LOTEMAX)  0.5 % ophthalmic suspension 1 drop at bedtime.     Melatonin 10 MG CAPS Take 10 capsules by mouth at bedtime. (Patient taking differently: Take 10 capsules by mouth as needed.)     metFORMIN  (GLUCOPHAGE ) 500 MG tablet Take 1 tablet (500 mg total) by mouth 3 (three) times daily. 270 tablet 3   methocarbamol  (ROBAXIN ) 500 MG tablet Take 1 tablet (500 mg total) by mouth every 8 (eight) hours as needed for muscle spasms. 90 tablet 1   prednisoLONE acetate (PRED FORTE) 1 % ophthalmic suspension 1 drop 4 (four) times daily. (Patient taking differently: 1 drop daily at 2 PM.)     PROLENSA 0.07 % SOLN Apply to eye.     riTUXimab  (RITUXAN) 500 MG/50ML injection Inject into the vein every 6 (six) months.     riTUXimab -abbs (TRUXIMA ) 100 MG/10ML injection Inject into the vein. Injection 3 times a year     tadalafil  (CIALIS ) 5 MG tablet Take 1 tablet (5 mg total) by mouth daily. 30 tablet 11   tamsulosin  (FLOMAX ) 0.4 MG CAPS capsule Take 1 capsule (0.4 mg total) by mouth daily. 90 capsule 3   traZODone  (DESYREL ) 50 MG tablet Take 0.5-1 tablets (25-50 mg total) by mouth at bedtime. 90 tablet 3   typhoid (VIVOTIF) DR capsule Take 1 capsule by mouth every other day. 4 capsule 0  verapamil  (CALAN -SR) 240 MG CR tablet Take 1 tablet (240 mg total) by mouth at bedtime. Please stop amlodipine . 90 tablet 3   No facility-administered medications prior to visit.    ROS: Review of Systems  Objective:  BP (!) 142/78   Pulse (!) 57   Ht 5' 7 (1.702 m)   Wt 122 lb 1.6 oz (55.4 kg)   SpO2 98%   BMI 19.12 kg/m   BP Readings from Last 3 Encounters:  01/06/25 (!) 142/78  01/01/25 (!) 140/80  09/25/24 (!) 150/82    Wt Readings from Last 3 Encounters:  01/06/25 122 lb 1.6 oz (55.4 kg)  01/01/25 123 lb (55.8 kg)  09/25/24 123 lb 6.4 oz (56 kg)    Physical Exam  Lab Results  Component Value Date   WBC 7.9 01/01/2025   HGB 12.9 (L) 01/01/2025   HCT 38.2 (L) 01/01/2025   PLT 359.0 01/01/2025   GLUCOSE  151 (H) 01/01/2025   CHOL 171 09/25/2024   TRIG 79.0 09/25/2024   HDL 57.90 09/25/2024   LDLCALC 98 09/25/2024   ALT 15 01/01/2025   AST 22 01/01/2025   NA 138 01/01/2025   K 3.6 01/01/2025   CL 103 01/01/2025   CREATININE 1.36 01/01/2025   BUN 23 01/01/2025   CO2 29 01/01/2025   TSH 1.07 09/25/2024   PSA 0.82 09/25/2024   INR 1.01 09/03/2014   HGBA1C 6.0 09/25/2024   MICROALBUR 2.0 (H) 09/25/2024    MR BRAIN/IAC W WO CONTRAST Result Date: 07/31/2022 CLINICAL DATA:  Provided history: Hearing loss. Additional history provided by scanning technologist: Patient reports left greater than right hearing loss for 6 months. Blurred vision. EXAM: MRI HEAD WITHOUT AND WITH CONTRAST TECHNIQUE: Multiplanar, multiecho pulse sequences of the brain and surrounding structures were obtained without and with intravenous contrast. CONTRAST:  12mL MULTIHANCE  GADOBENATE DIMEGLUMINE  529 MG/ML IV SOLN COMPARISON:  CT of the orbits 06/20/2021. Brain MRI 01/05/2021. FINDINGS: Intermittently motion degraded examination. Most notably, there is mild-to-moderate motion degradation of the axial high-resolution heavily T2 weighted sequence through the internal auditory canals and moderate motion degradation of the whole brain axial T1 weighted post-contrast sequence. Brain: Mild to moderate generalized parenchymal atrophy. Multifocal T2 FLAIR hyperintense signal abnormality within the cerebral white matter, nonspecific but compatible with mild chronic small vessel ischemic disease. No evidence of an intracranial mass. Specifically, no cerebellopontine angle or internal auditory canal mass is demonstrated. Unremarkable appearance of the 7th and 8th cranial nerves bilaterally. There is no acute infarct. No chronic intracranial blood products. No extra-axial fluid collection. No midline shift. No pathologic intracranial enhancement identified. Vascular: Maintained flow voids within the proximal large arterial vessels. Skull and  upper cervical spine: No focal suspicious marrow lesion. Incompletely assessed cervical spondylosis. Sinuses/Orbits: No mass or acute finding within the imaged orbits. Prior bilateral ocular lens replacement. Small mucous retention cysts within the bilateral maxillary sinuses. Trace mucosal thickening within the bilateral ethmoid sinuses. IMPRESSION: Intermittently motion degraded examination. No evidence of acute intracranial abnormality. No cerebellopontine angle or internal auditory canal mass. Mild chronic small vessel ischemic changes within the cerebral white matter, similar to the prior brain MRI of 01/05/2021. Mild-to-moderate generalized parenchymal atrophy. Mild paranasal sinus disease, as described. Electronically Signed   By: Rockey Childs D.O.   On: 07/31/2022 08:01    Assessment & Plan:   Problem List Items Addressed This Visit     Diabetes type 2, controlled (HCC) - Primary   Check A1c  Relevant Orders   Hemoglobin A1c   T4, free   Vitamin B 12 deficiency   Chronic Cont w/B12 injections      Right sided temporal headache   New moderate  R sided HA,  6/10 (8/10 at times) x 2 weeks. Chewing makes it worse... No new vision loss, no wt loss.SABRASABRANo rash Nollan took 30 mg Prednisone  on 12/26/24. ESR was 4 on 01/01/25.  Dr Rosemarie does not think it is GCA... He has a Neurology appt tomorrow 8 am - Dr Rosemarie wants to hold off brain CT, temporal artery bx till tomorrow Empiric Prednisone  Rx taper given Repeat ESR, check CRP      Relevant Orders   Sedimentation rate   C-reactive protein   T4, free   Ocular pemphigoid (HCC)   On Losartan  eye drops      Relevant Orders   Sedimentation rate   C-reactive protein   Episodic cluster headache, not intractable   Remote h/o cluster HAs This HA is different (12/2024)         Meds ordered this encounter  Medications   predniSONE  (DELTASONE ) 10 MG tablet    Sig: Prednisone  10 mg: take 4 tabs a day x 3 days; then 3 tabs a day x 4  days; then 2 tabs a day x 4 days, then 1 tab a day x 6 days, then stop. Take pc.    Dispense:  38 tablet    Refill:  1      Follow-up: No follow-ups on file.  Marolyn Noel, MD "

## 2025-01-06 NOTE — Addendum Note (Signed)
 Addended by: MAISIE MILLING D on: 01/06/2025 02:10 PM   Modules accepted: Orders

## 2025-01-06 NOTE — Assessment & Plan Note (Signed)
On Losartan eye drops

## 2025-01-06 NOTE — Assessment & Plan Note (Signed)
Chronic Cont w/B12 injections

## 2025-01-06 NOTE — Patient Instructions (Signed)

## 2025-01-06 NOTE — Assessment & Plan Note (Signed)
 Remote h/o cluster HAs This HA is different (12/2024)

## 2025-01-06 NOTE — Assessment & Plan Note (Signed)
 Check A1c.

## 2025-01-07 ENCOUNTER — Ambulatory Visit: Admitting: Neurology

## 2025-01-07 ENCOUNTER — Encounter: Payer: Self-pay | Admitting: Neurology

## 2025-01-07 VITALS — BP 148/74 | HR 60 | Ht 66.0 in | Wt 122.0 lb

## 2025-01-07 DIAGNOSIS — L121 Cicatricial pemphigoid: Secondary | ICD-10-CM

## 2025-01-07 DIAGNOSIS — Z79899 Other long term (current) drug therapy: Secondary | ICD-10-CM

## 2025-01-07 DIAGNOSIS — G44019 Episodic cluster headache, not intractable: Secondary | ICD-10-CM | POA: Diagnosis not present

## 2025-01-07 MED ORDER — RIZATRIPTAN BENZOATE 5 MG PO TABS: 5.0000 mg | ORAL_TABLET | ORAL | 11 refills | Status: AC | PRN

## 2025-01-07 NOTE — Progress Notes (Signed)
 "   [facility]  PATIENT: Louis Kemp DOB: 1941/07/05  REFERRING DOCTOR OR PCP:  Dr. Garald SOURCE: Patient, notes from Dr. Darleen, imaging and lab reports, MRI images personally reviewed  _________________________________   HISTORICAL  CHIEF COMPLAINT:  Chief Complaint  Patient presents with   Follow-up    Room 10 With wife  Episodic cluster headache, not intractable      HISTORY OF PRESENT ILLNESS:  Louis Kemp is an 84 year old man with episodic cluster/migraine headaches who also has ocular cicatricial pemphigoid.   Cluster HA: He had been seeing Dr. Darleen who has left the practice.  He is on Emgality .    He is experiencing HA 2-3 a month.  When one occurs, he takes Tylenol  or Aleve, usually with benefit.   He had a mire severe HA 10 days ago and took a hydrocodone .   THat one was bad enough that he could not sleep.  In general, theu have been much better since he started Emgality .  He has right sided HA much more than left.   Sometimes worse if he chews.    He denies nausea but has photophobia.      Medications tried in past:  NSAIDs.  Emgality  has helped very well for his headaches.  Recent ESR, CRP were normal    Ocular cicatricial pemphigoid:   His eyes remain dry.   His last Rituxan was October and he takes every 6 months and he does this every 6 months.  His ophthalmologist is Dr. Herminio locally and Dr Tobin at [facility].  He does his infusions in our office.   He does not have other autoimmune disorders.    B cells are adequately suppressed  IMAGING: MRI brain 2023 showed age related atrophy and microvascular changes but no acute findings.    REVIEW OF SYSTEMS: Constitutional: No fevers, chills, sweats, or change in appetite Eyes: No visual changes, double vision, eye pain Ear, nose and throat: No hearing loss, ear pain, nasal congestion, sore throat Cardiovascular: No chest pain, palpitations Respiratory:  No shortness of breath at rest or  with exertion.   No wheezes GastrointestinaI: No nausea, vomiting, diarrhea, abdominal pain, fecal incontinence Genitourinary:  No dysuria, urinary retention or frequency.  No nocturia. Musculoskeletal:  No neck pain, back pain Integumentary: No rash, pruritus, skin lesions Neurological: as above Psychiatric: No depression at this time.  No anxiety Endocrine: No palpitations, diaphoresis, change in appetite, change in weigh or increased thirst Hematologic/Lymphatic:  No anemia, purpura, petechiae. Allergic/Immunologic: No itchy/runny eyes, nasal congestion, recent allergic reactions, rashes  ALLERGIES: Allergies[1]  HOME MEDICATIONS: Current Medications[2]  PAST MEDICAL HISTORY: Past Medical History:  Diagnosis Date   BPH (benign prostatic hypertrophy)    Hyperlipidemia    Hypertension    LBP (low back pain)    MVP (mitral valve prolapse)    OCP (ocular cicatricial pemphigoid) (HCC) 01/2020   on cellcept   Osteoarthritis    PAC (premature atrial contraction)    Statin intolerance    Type II or unspecified type diabetes mellitus without mention of complication, not stated as uncontrolled 2011   Vitamin B 12 deficiency 2011   Vitamin D  deficiency    Vitiligo     PAST SURGICAL HISTORY: Past Surgical History:  Procedure Laterality Date   arm surgery Left    ORIF left  forearm   CATARACT EXTRACTION  2012   EYE SURGERY     eye surgery Right 05/02/2022   remove scar on cornea  and then put amniotic cells on it   eyelid surgery Right    HERNIA REPAIR Bilateral 1971   PARTIAL KNEE ARTHROPLASTY Left 09/14/2014   Procedure: LEFT UNICOMPARTMENTAL KNEE ARTHROPLASTY MEDIALLY;  Surgeon: Donnice JONETTA Car, MD;  Location: [facility];  Service: Orthopedics;  Laterality: Left;   TOTAL KNEE ARTHROPLASTY Right 06/15/2014   Procedure: RIGHT TOTAL KNEE ARTHROPLASTY;  Surgeon: Donnice JONETTA Car, MD;  Location: [facility];  Service: Orthopedics;  Laterality: Right;    FAMILY HISTORY: Family History   Problem Relation Age of Onset   Arthritis Mother    Hypertension Father    Coronary artery disease Father    Hypertension Brother    Hypertension Other    Hypertension Brother    Coronary artery disease Other     SOCIAL HISTORY: Social History   Socioeconomic History   Marital status: Married    Spouse name: [name]   Number of children: Not on file   Years of education: Not on file   Highest education level: Not on file  Occupational History   Occupation: MD Retired  Tobacco Use   Smoking status: Former    Types: Cigars   Smokeless tobacco: Never   Tobacco comments:    3-4 cigars per year in the past but none in the past 10 years  Vaping Use   Vaping status: Never Used  Substance and Sexual Activity   Alcohol use: Not Currently    Alcohol/week: 0.0 - 1.0 standard drinks of alcohol    Comment: maybe 1 mixed drink maybe once a month   Drug use: No   Sexual activity: Not Currently  Other Topics Concern   Not on file  Social History Narrative   Regular Exercise -  YES, golf   Lives at home with wife    Right handed   Caffeine :  1-2 cups/day      Lives with wife-2025   Retired    Social Drivers of Health   Tobacco Use: Medium Risk (01/07/2025)   Patient History    Smoking Tobacco Use: Former    Smokeless Tobacco Use: Never    Passive Exposure: Not on Actuary Strain: Low Risk (02/01/2024)   Overall Financial Resource Strain (CARDIA)    Difficulty of Paying Living Expenses: Not hard at all  Food Insecurity: No Food Insecurity (02/01/2024)   Hunger Vital Sign    Worried About Running Out of Food in the Last Year: Never true    Ran Out of Food in the Last Year: Never true  Transportation Needs: No Transportation Needs (02/01/2024)   PRAPARE - Administrator, Civil Service (Medical): No    Lack of Transportation (Non-Medical): No  Physical Activity: Insufficiently Active (02/01/2024)   Exercise Vital Sign    Days of Exercise per Week: 4 days     Minutes of Exercise per Session: 30 min  Stress: No Stress Concern Present (02/01/2024)   [employer] of Occupational Health - Occupational Stress Questionnaire    Feeling of Stress : Not at all  Social Connections: Moderately Isolated (02/01/2024)   Social Connection and Isolation Panel    Frequency of Communication with Friends and Family: More than three times a week    Frequency of Social Gatherings with Friends and Family: Not on file    Attends Religious Services: Never    Database Administrator or Organizations: No    Attends Banker Meetings: Never    Marital Status: Married  Intimate Partner Violence: Not At Risk (02/01/2024)   Humiliation, Afraid, Rape, and Kick questionnaire    Fear of Current or Ex-Partner: No    Emotionally Abused: No    Physically Abused: No    Sexually Abused: No  Depression (PHQ2-9): Low Risk (01/01/2025)   Depression (PHQ2-9)    PHQ-2 Score: 0  Alcohol Screen: Low Risk (02/01/2024)   Alcohol Screen    Last Alcohol Screening Score (AUDIT): 0  Housing: Unknown (02/01/2024)   Housing Stability Vital Sign    Unable to Pay for Housing in the Last Year: No    Number of Times Moved in the Last Year: Not on file    Homeless in the Last Year: No  Utilities: Not At Risk (02/01/2024)   A[employer] Utilities    Threatened with loss of utilities: No  Health Literacy: Adequate Health Literacy (02/01/2024)   B1300 Health Literacy    Frequency of need for help with medical instructions: Never       PHYSICAL EXAM  Vitals:   01/07/25 0839  BP: (!) 148/74  Pulse: 60  SpO2: 97%  Weight: 122 lb (55.3 kg)  Height: 5' 6 (1.676 m)    Body mass index is 19.69 kg/m.   General: The patient is well-developed and well-nourished and in no acute distress  HEENT:  Head is [place]/AT.  Sclera are anicteric.     Neck: No carotid bruits are noted.  The neck is nontender with a good range of motion.  Mild tenderness over the temples..  Cardiovascular: The heart  has a regular rate and rhythm with a normal S1 and S2. There were no murmurs, gallops or rubs.    Skin: Extremities are without rash or  edema.  Musculoskeletal:  Back is nontender  Neurologic Exam  Mental status: The patient is alert and oriented x 3 at the time of the examination. The patient has apparent normal recent and remote memory, with an apparently normal attention span and concentration ability.   Speech is normal.  Cranial nerves: Extraocular movements are full. There is good facial sensation to soft touch bilaterally.Facial strength is normal.  Trapezius and sternocleidomastoid strength is normal. No dysarthria is noted.   No obvious hearing deficits are noted.  Motor:  Muscle bulk is normal.   Tone is normal. Strength is  5 / 5 in all 4 extremities.   Sensory: Sensory testing is intact to pinprick, soft touch and vibration sensation in all 4 extremities.  Coordination: Cerebellar testing reveals good finger-nose-finger and heel-to-shin bilaterally.  Gait and station: Station is normal.   Gait is normal for age. Tandem gait is wide. Romberg is negative.   Reflexes: Deep tendon reflexes are symmetric and normal bilaterally.   Plantar responses are flexor.    DIAGNOSTIC DATA (LABS, IMAGING, TESTING) - I reviewed patient records, labs, notes, testing and imaging myself where available.  Lab Results  Component Value Date   WBC 7.9 01/01/2025   HGB 12.9 (L) 01/01/2025   HCT 38.2 (L) 01/01/2025   MCV 91.7 01/01/2025   PLT 359.0 01/01/2025      Component Value Date/Time   NA 138 01/01/2025 1405   NA 140 10/30/2024 1109   K 3.6 01/01/2025 1405   CL 103 01/01/2025 1405   CO2 29 01/01/2025 1405   GLUCOSE 151 (H) 01/01/2025 1405   BUN 23 01/01/2025 1405   BUN 19 10/30/2024 1109   CREATININE 1.36 01/01/2025 1405   CREATININE 1.22 01/13/2013 1235   CALCIUM 9.5  01/01/2025 1405   PROT 6.5 01/01/2025 1405   PROT 7.3 10/30/2024 1109   ALBUMIN 4.1 01/01/2025 1405    ALBUMIN 4.5 10/30/2024 1109   AST 22 01/01/2025 1405   ALT 15 01/01/2025 1405   ALKPHOS 70 01/01/2025 1405   BILITOT 0.4 01/01/2025 1405   BILITOT 0.4 10/30/2024 1109   GFRNONAA 53 (L) 02/17/2021 1202   GFRAA 62 02/17/2021 1202   Lab Results  Component Value Date   CHOL 171 09/25/2024   HDL 57.90 09/25/2024   LDLCALC 98 09/25/2024   TRIG 79.0 09/25/2024   CHOLHDL 3 09/25/2024   Lab Results  Component Value Date   HGBA1C 5.7 01/06/2025   Lab Results  Component Value Date   VITAMINB12 1,308 (H) 12/20/2023   Lab Results  Component Value Date   TSH 1.07 09/25/2024       ASSESSMENT AND PLAN  Pemphigoid, cicatricial (HCC) - Plan: IgG, IgA, IgM  High risk medication use - Plan: IgG, IgA, IgM  Episodic cluster headache, not intractable  Long-term use of high-risk medication   For the OCP, he will continue Rituxan at this time.  He will be seeing ophthalmology again in a few months there was some consideration of stopping Rituxan.  If he stays on medication his next infusion will be in April.  I will check IgG and IgM and consider dose adjustment if these are very low For the episodic migraine, he will continue Emgality .  I will send in a prescription of Maxalt  5 mg to take when a migraine or cluster headache occurs Return in 7 months or sooner for new or worsening neurologic symptoms.  This visit is part of a comprehensive longitudinal care medical relationship regarding the patients primary diagnosis of episodic cluster HA and related concerns.    Estefanny Moler A. Sammie Schermerhorn, MD, [facility] 01/07/2025, 9:11 AM Certified in Neurology, Clinical Neurophysiology, Sleep Medicine and Neuroimaging  [facility] [address], [address] [place], [place] [address] [phone]     [1]  Allergies Allergen Reactions   Statins     Other reaction(s): Muscle Pain   Atorvastatin     REACTION: aches   Rosuvastatin     REACTION: aches   Zolpidem Tartrate     Did not  work well   Zonisamide      Abd colics  [2]  Current Outpatient Medications:    atovaquone -proguanil (MALARONE ) 250-100 MG TABS tablet, Start 1 tab a day one day prior to your trip. Take 1 tablet daily while traveling. Continue 1 tablet daily for 1 week after retrun, Disp: , Rfl:    Cholecalciferol  (VITAMIN D ) 2000 UNITS CAPS, Take 1 capsule by mouth every other day., Disp: , Rfl:    cyanocobalamin  (VITAMIN B12) 1000 MCG/ML injection, Inject 1 mL (1,000 mcg total) into the skin every 14 (fourteen) days., Disp: 10 mL, Rfl: 3   fenofibrate  (TRICOR ) 145 MG tablet, Take 1 tablet (145 mg total) by mouth daily., Disp: 90 tablet, Rfl: 3   fluorometholone (FML) 0.1 % ophthalmic suspension, Apply 1 drop to eye., Disp: , Rfl:    gabapentin  (NEURONTIN ) 100 MG capsule, Take 1 capsule (100 mg total) by mouth at bedtime., Disp: 90 capsule, Rfl: 1   Galcanezumab -gnlm (EMGALITY , 300 MG DOSE,) 100 MG/ML SOSY, Inject 300 mg (3 pens) into the skin every 30 days during cluster headache cycle and stop when cycle is over., Disp: , Rfl:    HYDROcodone -acetaminophen  (NORCO/VICODIN) 5-325 MG tablet, Take 1 tablet by mouth every 6 (  six) hours as needed., Disp: 15 tablet, Rfl: 0   loteprednol (LOTEMAX) 0.5 % ophthalmic suspension, 1 drop at bedtime., Disp: , Rfl:    Melatonin 10 MG CAPS, Take 10 capsules by mouth at bedtime. (Patient taking differently: Take 10 capsules by mouth as needed.), Disp: , Rfl:    metFORMIN  (GLUCOPHAGE ) 500 MG tablet, Take 1 tablet (500 mg total) by mouth 3 (three) times daily., Disp: 270 tablet, Rfl: 3   methocarbamol  (ROBAXIN ) 500 MG tablet, Take 1 tablet (500 mg total) by mouth every 8 (eight) hours as needed for muscle spasms., Disp: 90 tablet, Rfl: 1   prednisoLONE acetate (PRED FORTE) 1 % ophthalmic suspension, 1 drop 4 (four) times daily. (Patient taking differently: 1 drop daily at 2 PM.), Disp: , Rfl:    predniSONE  (DELTASONE ) 10 MG tablet, Prednisone  10 mg: take 4 tabs a day x 3 days; then  3 tabs a day x 4 days; then 2 tabs a day x 4 days, then 1 tab a day x 6 days, then stop. Take pc., Disp: 38 tablet, Rfl: 1   PROLENSA 0.07 % SOLN, Apply to eye., Disp: , Rfl:    riTUXimab  (RITUXAN) 500 MG/50ML injection, Inject into the vein every 6 (six) months., Disp: , Rfl:    riTUXimab -abbs (TRUXIMA ) 100 MG/10ML injection, Inject into the vein. Injection 3 times a year, Disp: , Rfl:    rizatriptan  (MAXALT ) 5 MG tablet, Take 1 tablet (5 mg total) by mouth as needed for migraine. May repeat in 2 hours if needed, Disp: 10 tablet, Rfl: 11   tadalafil  (CIALIS ) 5 MG tablet, Take 1 tablet (5 mg total) by mouth daily., Disp: 30 tablet, Rfl: 11   tamsulosin  (FLOMAX ) 0.4 MG CAPS capsule, Take 1 capsule (0.4 mg total) by mouth daily., Disp: 90 capsule, Rfl: 3   traZODone  (DESYREL ) 50 MG tablet, Take 0.5-1 tablets (25-50 mg total) by mouth at bedtime., Disp: 90 tablet, Rfl: 3   typhoid (VIVOTIF) DR capsule, Take 1 capsule by mouth every other day., Disp: 4 capsule, Rfl: 0   verapamil  (CALAN -SR) 240 MG CR tablet, Take 1 tablet (240 mg total) by mouth at bedtime. Please stop amlodipine ., Disp: 90 tablet, Rfl: 3   diphenoxylate -atropine  (LOMOTIL ) 2.5-0.025 MG tablet, Take 1-2 tablets by mouth 4 (four) times daily as needed for diarrhea or loose stools. (Patient not taking: Reported on 01/07/2025), Disp: , Rfl:    escitalopram  (LEXAPRO ) 5 MG tablet, Take 1 tablet (5 mg total) by mouth at bedtime. (Patient not taking: Reported on 01/07/2025), Disp: 30 tablet, Rfl: 5   HYDROcodone -acetaminophen  (NORCO) 7.5-325 MG tablet, Take 1-2 tablets by mouth every 6 (six) hours as needed for moderate pain (pain score 4-6). (Patient not taking: Reported on 01/07/2025), Disp: 60 tablet, Rfl: 0  "

## 2025-01-08 ENCOUNTER — Ambulatory Visit: Payer: Self-pay | Admitting: Neurology

## 2025-01-08 LAB — IGG, IGA, IGM
IgG (Immunoglobin G), Serum: 830 mg/dL (ref 603–1613)
IgM (Immunoglobulin M), Srm: 36 mg/dL (ref 15–143)
Immunoglobulin A, (IgA) QN, Serum: 246 mg/dL (ref 61–437)

## 2025-01-09 ENCOUNTER — Other Ambulatory Visit: Payer: Self-pay | Admitting: Internal Medicine

## 2025-01-09 DIAGNOSIS — G44019 Episodic cluster headache, not intractable: Secondary | ICD-10-CM

## 2025-01-11 ENCOUNTER — Other Ambulatory Visit: Payer: Self-pay | Admitting: Internal Medicine

## 2025-01-12 ENCOUNTER — Other Ambulatory Visit: Payer: Self-pay | Admitting: Emergency Medicine

## 2025-01-12 DIAGNOSIS — G44019 Episodic cluster headache, not intractable: Secondary | ICD-10-CM

## 2025-01-17 ENCOUNTER — Other Ambulatory Visit: Payer: Self-pay | Admitting: Internal Medicine

## 2025-01-20 ENCOUNTER — Ambulatory Visit: Admitting: Internal Medicine

## 2025-02-18 ENCOUNTER — Ambulatory Visit: Admitting: Internal Medicine

## 2025-04-06 ENCOUNTER — Ambulatory Visit: Admitting: Neurology

## 2025-08-27 ENCOUNTER — Ambulatory Visit: Admitting: Neurology
